# Patient Record
Sex: Female | Born: 1935 | Race: White | Hispanic: No | Marital: Married | Smoking: Never smoker
Health system: Southern US, Community
[De-identification: ages and names within clinical notes are randomized; demographics above are authoritative.]

## PROBLEM LIST (undated history)

## (undated) DIAGNOSIS — M8580 Other specified disorders of bone density and structure, unspecified site: Secondary | ICD-10-CM

## (undated) DIAGNOSIS — E785 Hyperlipidemia, unspecified: Secondary | ICD-10-CM

## (undated) DIAGNOSIS — N200 Calculus of kidney: Secondary | ICD-10-CM

## (undated) DIAGNOSIS — F419 Anxiety disorder, unspecified: Secondary | ICD-10-CM

## (undated) DIAGNOSIS — K219 Gastro-esophageal reflux disease without esophagitis: Secondary | ICD-10-CM

## (undated) DIAGNOSIS — A048 Other specified bacterial intestinal infections: Secondary | ICD-10-CM

## (undated) DIAGNOSIS — M1990 Unspecified osteoarthritis, unspecified site: Secondary | ICD-10-CM

## (undated) DIAGNOSIS — C801 Malignant (primary) neoplasm, unspecified: Secondary | ICD-10-CM

## (undated) HISTORY — PX: SKIN CANCER EXCISION: SHX779

## (undated) HISTORY — DX: Calculus of kidney: N20.0

## (undated) HISTORY — DX: Other specified bacterial intestinal infections: A04.8

## (undated) HISTORY — DX: Unspecified osteoarthritis, unspecified site: M19.90

## (undated) HISTORY — DX: Other specified disorders of bone density and structure, unspecified site: M85.80

## (undated) HISTORY — DX: Hyperlipidemia, unspecified: E78.5

## (undated) HISTORY — DX: Anxiety disorder, unspecified: F41.9

## (undated) HISTORY — DX: Malignant (primary) neoplasm, unspecified: C80.1

## (undated) HISTORY — DX: Gastro-esophageal reflux disease without esophagitis: K21.9

---

## 1998-02-11 ENCOUNTER — Other Ambulatory Visit: Admission: RE | Admit: 1998-02-11 | Discharge: 1998-02-11 | Payer: Self-pay | Admitting: Obstetrics and Gynecology

## 1999-05-13 ENCOUNTER — Other Ambulatory Visit: Admission: RE | Admit: 1999-05-13 | Discharge: 1999-05-13 | Payer: Self-pay | Admitting: Obstetrics and Gynecology

## 2000-06-24 ENCOUNTER — Other Ambulatory Visit: Admission: RE | Admit: 2000-06-24 | Discharge: 2000-06-24 | Payer: Self-pay | Admitting: Obstetrics and Gynecology

## 2001-06-28 HISTORY — PX: OTHER SURGICAL HISTORY: SHX169

## 2001-07-12 ENCOUNTER — Encounter: Admission: RE | Admit: 2001-07-12 | Discharge: 2001-07-12 | Payer: Self-pay | Admitting: General Surgery

## 2001-07-12 ENCOUNTER — Encounter: Payer: Self-pay | Admitting: General Surgery

## 2001-07-12 ENCOUNTER — Other Ambulatory Visit: Admission: RE | Admit: 2001-07-12 | Discharge: 2001-07-12 | Payer: Self-pay | Admitting: General Surgery

## 2001-07-24 ENCOUNTER — Encounter: Payer: Self-pay | Admitting: General Surgery

## 2001-07-27 ENCOUNTER — Ambulatory Visit (HOSPITAL_COMMUNITY): Admission: RE | Admit: 2001-07-27 | Discharge: 2001-07-27 | Payer: Self-pay | Admitting: General Surgery

## 2001-07-27 ENCOUNTER — Encounter (INDEPENDENT_AMBULATORY_CARE_PROVIDER_SITE_OTHER): Payer: Self-pay | Admitting: *Deleted

## 2001-08-08 ENCOUNTER — Ambulatory Visit: Admission: RE | Admit: 2001-08-08 | Discharge: 2001-11-06 | Payer: Self-pay | Admitting: *Deleted

## 2002-06-28 HISTORY — PX: COLONOSCOPY: SHX174

## 2002-07-05 ENCOUNTER — Other Ambulatory Visit: Admission: RE | Admit: 2002-07-05 | Discharge: 2002-07-05 | Payer: Self-pay | Admitting: Obstetrics and Gynecology

## 2003-04-03 ENCOUNTER — Encounter: Admission: RE | Admit: 2003-04-03 | Discharge: 2003-04-03 | Payer: Self-pay | Admitting: Internal Medicine

## 2003-04-03 ENCOUNTER — Encounter: Payer: Self-pay | Admitting: Internal Medicine

## 2003-06-12 ENCOUNTER — Encounter: Admission: RE | Admit: 2003-06-12 | Discharge: 2003-06-12 | Payer: Self-pay | Admitting: Internal Medicine

## 2004-05-08 ENCOUNTER — Ambulatory Visit: Payer: Self-pay | Admitting: Internal Medicine

## 2004-11-11 ENCOUNTER — Ambulatory Visit: Payer: Self-pay | Admitting: Hematology & Oncology

## 2005-01-15 ENCOUNTER — Ambulatory Visit: Payer: Self-pay | Admitting: Internal Medicine

## 2005-01-22 ENCOUNTER — Ambulatory Visit: Payer: Self-pay | Admitting: Internal Medicine

## 2005-03-24 ENCOUNTER — Inpatient Hospital Stay (HOSPITAL_COMMUNITY): Admission: EM | Admit: 2005-03-24 | Discharge: 2005-03-25 | Payer: Self-pay | Admitting: Internal Medicine

## 2005-03-24 ENCOUNTER — Encounter: Payer: Self-pay | Admitting: Emergency Medicine

## 2005-03-24 ENCOUNTER — Ambulatory Visit: Payer: Self-pay | Admitting: Internal Medicine

## 2005-03-25 ENCOUNTER — Ambulatory Visit: Payer: Self-pay | Admitting: Cardiology

## 2005-03-25 ENCOUNTER — Encounter: Payer: Self-pay | Admitting: Cardiology

## 2005-04-02 ENCOUNTER — Ambulatory Visit: Payer: Self-pay | Admitting: Internal Medicine

## 2005-04-08 ENCOUNTER — Ambulatory Visit: Payer: Self-pay | Admitting: Internal Medicine

## 2005-04-16 ENCOUNTER — Ambulatory Visit: Payer: Self-pay | Admitting: Internal Medicine

## 2005-04-27 ENCOUNTER — Ambulatory Visit: Payer: Self-pay | Admitting: Internal Medicine

## 2005-05-04 ENCOUNTER — Ambulatory Visit: Payer: Self-pay | Admitting: Internal Medicine

## 2005-05-12 ENCOUNTER — Ambulatory Visit: Payer: Self-pay | Admitting: Hematology & Oncology

## 2005-11-02 ENCOUNTER — Ambulatory Visit: Payer: Self-pay | Admitting: Internal Medicine

## 2005-11-09 ENCOUNTER — Ambulatory Visit: Payer: Self-pay | Admitting: Hematology & Oncology

## 2005-11-11 LAB — CBC WITH DIFFERENTIAL/PLATELET
BASO%: 0.6 % (ref 0.0–2.0)
Basophils Absolute: 0 10e3/uL (ref 0.0–0.1)
EOS%: 0.2 % (ref 0.0–7.0)
Eosinophils Absolute: 0 10e3/uL (ref 0.0–0.5)
HCT: 40.1 % (ref 34.8–46.6)
HGB: 13.7 g/dL (ref 11.6–15.9)
LYMPH%: 29.1 % (ref 14.0–48.0)
MCH: 31.3 pg (ref 26.0–34.0)
MCHC: 34 g/dL (ref 32.0–36.0)
MCV: 91.8 fL (ref 81.0–101.0)
MONO#: 0.5 10e3/uL (ref 0.1–0.9)
MONO%: 11.6 % (ref 0.0–13.0)
NEUT#: 2.7 10e3/uL (ref 1.5–6.5)
NEUT%: 58.5 % (ref 39.6–76.8)
Platelets: 227 10e3/uL (ref 145–400)
RBC: 4.37 10e6/uL (ref 3.70–5.32)
RDW: 12.3 % (ref 11.3–14.5)
WBC: 4.6 10e3/uL (ref 3.9–10.0)
lymph#: 1.3 10e3/uL (ref 0.9–3.3)

## 2005-11-11 LAB — COMPREHENSIVE METABOLIC PANEL WITH GFR
ALT: 19 U/L (ref 0–40)
AST: 18 U/L (ref 0–37)
Albumin: 4.5 g/dL (ref 3.5–5.2)
Alkaline Phosphatase: 61 U/L (ref 39–117)
BUN: 14 mg/dL (ref 6–23)
CO2: 28 meq/L (ref 19–32)
Calcium: 9.5 mg/dL (ref 8.4–10.5)
Chloride: 104 meq/L (ref 96–112)
Creatinine, Ser: 0.8 mg/dL (ref 0.4–1.2)
Glucose, Bld: 65 mg/dL — ABNORMAL LOW (ref 70–99)
Potassium: 4.2 meq/L (ref 3.5–5.3)
Sodium: 142 meq/L (ref 135–145)
Total Bilirubin: 0.4 mg/dL (ref 0.3–1.2)
Total Protein: 7.1 g/dL (ref 6.0–8.3)

## 2005-11-25 ENCOUNTER — Ambulatory Visit: Payer: Self-pay | Admitting: Internal Medicine

## 2005-12-06 ENCOUNTER — Ambulatory Visit: Payer: Self-pay | Admitting: Internal Medicine

## 2006-04-18 ENCOUNTER — Ambulatory Visit: Payer: Self-pay | Admitting: Internal Medicine

## 2006-05-10 ENCOUNTER — Ambulatory Visit: Payer: Self-pay | Admitting: Hematology & Oncology

## 2006-05-12 LAB — CBC WITH DIFFERENTIAL/PLATELET
BASO%: 0.7 % (ref 0.0–2.0)
Basophils Absolute: 0 10*3/uL (ref 0.0–0.1)
HCT: 40 % (ref 34.8–46.6)
HGB: 13.7 g/dL (ref 11.6–15.9)
MCHC: 34.2 g/dL (ref 32.0–36.0)
MONO#: 0.6 10*3/uL (ref 0.1–0.9)
NEUT#: 4.7 10*3/uL (ref 1.5–6.5)
NEUT%: 69.5 % (ref 39.6–76.8)
WBC: 6.8 10*3/uL (ref 3.9–10.0)
lymph#: 1.4 10*3/uL (ref 0.9–3.3)

## 2006-05-12 LAB — COMPREHENSIVE METABOLIC PANEL
ALT: 32 U/L (ref 0–35)
CO2: 29 mEq/L (ref 19–32)
Calcium: 9.9 mg/dL (ref 8.4–10.5)
Chloride: 105 mEq/L (ref 96–112)
Creatinine, Ser: 0.74 mg/dL (ref 0.40–1.20)

## 2006-11-15 ENCOUNTER — Ambulatory Visit: Payer: Self-pay | Admitting: Hematology & Oncology

## 2006-11-17 LAB — CBC WITH DIFFERENTIAL/PLATELET
Eosinophils Absolute: 0 10*3/uL (ref 0.0–0.5)
MONO#: 0.5 10*3/uL (ref 0.1–0.9)
NEUT#: 3.9 10*3/uL (ref 1.5–6.5)
RBC: 4.51 10*6/uL (ref 3.70–5.32)
RDW: 12.1 % (ref 11.3–14.5)
WBC: 6 10*3/uL (ref 3.9–10.0)

## 2006-11-17 LAB — COMPREHENSIVE METABOLIC PANEL
Albumin: 4.9 g/dL (ref 3.5–5.2)
CO2: 28 mEq/L (ref 19–32)
Chloride: 104 mEq/L (ref 96–112)
Glucose, Bld: 84 mg/dL (ref 70–99)
Potassium: 4.2 mEq/L (ref 3.5–5.3)
Sodium: 144 mEq/L (ref 135–145)
Total Protein: 7.1 g/dL (ref 6.0–8.3)

## 2006-12-12 ENCOUNTER — Encounter: Payer: Self-pay | Admitting: Internal Medicine

## 2007-02-09 ENCOUNTER — Ambulatory Visit: Payer: Self-pay | Admitting: Internal Medicine

## 2007-02-11 LAB — CONVERTED CEMR LAB
ALT: 18 units/L (ref 0–35)
Total CHOL/HDL Ratio: 3.2
Triglycerides: 96 mg/dL (ref 0–149)
VLDL: 19 mg/dL (ref 0–40)

## 2007-02-13 ENCOUNTER — Encounter (INDEPENDENT_AMBULATORY_CARE_PROVIDER_SITE_OTHER): Payer: Self-pay | Admitting: *Deleted

## 2007-02-17 ENCOUNTER — Ambulatory Visit: Payer: Self-pay | Admitting: Internal Medicine

## 2007-02-17 LAB — CONVERTED CEMR LAB: LDL Goal: 160 mg/dL

## 2007-03-07 ENCOUNTER — Encounter: Payer: Self-pay | Admitting: Internal Medicine

## 2007-03-07 ENCOUNTER — Ambulatory Visit: Payer: Self-pay | Admitting: Family Medicine

## 2007-06-01 ENCOUNTER — Ambulatory Visit: Payer: Self-pay | Admitting: Internal Medicine

## 2007-08-22 ENCOUNTER — Telehealth (INDEPENDENT_AMBULATORY_CARE_PROVIDER_SITE_OTHER): Payer: Self-pay | Admitting: *Deleted

## 2007-08-25 ENCOUNTER — Ambulatory Visit: Payer: Self-pay | Admitting: Internal Medicine

## 2007-08-25 ENCOUNTER — Encounter (INDEPENDENT_AMBULATORY_CARE_PROVIDER_SITE_OTHER): Payer: Self-pay | Admitting: *Deleted

## 2007-11-13 ENCOUNTER — Encounter: Payer: Self-pay | Admitting: Internal Medicine

## 2007-11-29 ENCOUNTER — Ambulatory Visit: Payer: Self-pay | Admitting: Internal Medicine

## 2007-12-06 ENCOUNTER — Ambulatory Visit: Payer: Self-pay | Admitting: Hematology & Oncology

## 2007-12-11 ENCOUNTER — Encounter: Payer: Self-pay | Admitting: Internal Medicine

## 2007-12-11 LAB — CBC WITH DIFFERENTIAL/PLATELET
Basophils Absolute: 0 10*3/uL (ref 0.0–0.1)
HCT: 38.6 % (ref 34.8–46.6)
HGB: 13.3 g/dL (ref 11.6–15.9)
MCH: 31.4 pg (ref 26.0–34.0)
MONO#: 0.5 10*3/uL (ref 0.1–0.9)
NEUT%: 58.5 % (ref 39.6–76.8)
WBC: 4.6 10*3/uL (ref 3.9–10.0)
lymph#: 1.4 10*3/uL (ref 0.9–3.3)

## 2007-12-11 LAB — COMPREHENSIVE METABOLIC PANEL
BUN: 18 mg/dL (ref 6–23)
CO2: 28 mEq/L (ref 19–32)
Calcium: 9.7 mg/dL (ref 8.4–10.5)
Chloride: 104 mEq/L (ref 96–112)
Creatinine, Ser: 0.85 mg/dL (ref 0.40–1.20)
Glucose, Bld: 88 mg/dL (ref 70–99)

## 2008-03-07 ENCOUNTER — Telehealth (INDEPENDENT_AMBULATORY_CARE_PROVIDER_SITE_OTHER): Payer: Self-pay | Admitting: *Deleted

## 2008-03-12 ENCOUNTER — Ambulatory Visit: Payer: Self-pay | Admitting: Internal Medicine

## 2008-03-13 ENCOUNTER — Encounter (INDEPENDENT_AMBULATORY_CARE_PROVIDER_SITE_OTHER): Payer: Self-pay | Admitting: *Deleted

## 2008-03-13 LAB — CONVERTED CEMR LAB
AST: 23 units/L (ref 0–37)
HDL: 56.3 mg/dL (ref >39.0)
Total CHOL/HDL Ratio: 2.9
VLDL: 12 mg/dL (ref 0–40)

## 2008-04-04 ENCOUNTER — Ambulatory Visit: Payer: Self-pay | Admitting: Internal Medicine

## 2008-04-04 DIAGNOSIS — E785 Hyperlipidemia, unspecified: Secondary | ICD-10-CM | POA: Insufficient documentation

## 2008-04-04 DIAGNOSIS — Z85828 Personal history of other malignant neoplasm of skin: Secondary | ICD-10-CM | POA: Insufficient documentation

## 2008-04-04 DIAGNOSIS — Z87442 Personal history of urinary calculi: Secondary | ICD-10-CM | POA: Insufficient documentation

## 2008-04-04 DIAGNOSIS — Z853 Personal history of malignant neoplasm of breast: Secondary | ICD-10-CM | POA: Insufficient documentation

## 2008-04-04 DIAGNOSIS — R351 Nocturia: Secondary | ICD-10-CM | POA: Insufficient documentation

## 2008-06-16 ENCOUNTER — Emergency Department (HOSPITAL_COMMUNITY): Admission: EM | Admit: 2008-06-16 | Discharge: 2008-06-16 | Payer: Self-pay | Admitting: Emergency Medicine

## 2008-07-04 ENCOUNTER — Ambulatory Visit: Payer: Self-pay | Admitting: Internal Medicine

## 2008-07-04 DIAGNOSIS — F411 Generalized anxiety disorder: Secondary | ICD-10-CM | POA: Insufficient documentation

## 2008-07-05 ENCOUNTER — Encounter: Payer: Self-pay | Admitting: Internal Medicine

## 2008-11-26 ENCOUNTER — Ambulatory Visit: Payer: Self-pay | Admitting: Hematology & Oncology

## 2008-11-27 ENCOUNTER — Encounter: Payer: Self-pay | Admitting: Internal Medicine

## 2008-11-27 LAB — COMPREHENSIVE METABOLIC PANEL
ALT: 20 U/L (ref 0–35)
AST: 21 U/L (ref 0–37)
Albumin: 4.4 g/dL (ref 3.5–5.2)
BUN: 18 mg/dL (ref 6–23)
CO2: 24 mEq/L (ref 19–32)
Calcium: 9.3 mg/dL (ref 8.4–10.5)
Chloride: 104 mEq/L (ref 96–112)
Creatinine, Ser: 0.68 mg/dL (ref 0.40–1.20)
Potassium: 4.1 mEq/L (ref 3.5–5.3)

## 2008-11-27 LAB — CBC WITH DIFFERENTIAL (CANCER CENTER ONLY)
Eosinophils Absolute: 0 10*3/uL (ref 0.0–0.5)
MCH: 31.4 pg (ref 26.0–34.0)
MONO%: 8.5 % (ref 0.0–13.0)
NEUT#: 2.7 10*3/uL (ref 1.5–6.5)
Platelets: 190 10*3/uL (ref 145–400)
RBC: 4.21 10*6/uL (ref 3.70–5.32)
WBC: 4.5 10*3/uL (ref 3.9–10.0)

## 2008-11-28 LAB — VITAMIN D 25 HYDROXY (VIT D DEFICIENCY, FRACTURES): Vit D, 25-Hydroxy: 39 ng/mL (ref 30–89)

## 2009-01-13 ENCOUNTER — Encounter: Payer: Self-pay | Admitting: Internal Medicine

## 2009-04-02 ENCOUNTER — Ambulatory Visit: Payer: Self-pay | Admitting: Internal Medicine

## 2009-04-02 ENCOUNTER — Encounter: Payer: Self-pay | Admitting: Family Medicine

## 2009-04-08 ENCOUNTER — Telehealth (INDEPENDENT_AMBULATORY_CARE_PROVIDER_SITE_OTHER): Payer: Self-pay | Admitting: *Deleted

## 2009-04-09 ENCOUNTER — Telehealth (INDEPENDENT_AMBULATORY_CARE_PROVIDER_SITE_OTHER): Payer: Self-pay | Admitting: *Deleted

## 2009-04-10 ENCOUNTER — Ambulatory Visit: Payer: Self-pay | Admitting: Internal Medicine

## 2009-04-12 LAB — CONVERTED CEMR LAB
HDL: 63.5 mg/dL (ref >39.00)
LDL Cholesterol: 119 mg/dL — ABNORMAL HIGH (ref 0–99)
Total CHOL/HDL Ratio: 3
Triglycerides: 74 mg/dL (ref 0.0–149.0)
VLDL: 14.8 mg/dL (ref 0.0–40.0)

## 2009-04-14 ENCOUNTER — Encounter (INDEPENDENT_AMBULATORY_CARE_PROVIDER_SITE_OTHER): Payer: Self-pay | Admitting: *Deleted

## 2009-07-17 ENCOUNTER — Telehealth (INDEPENDENT_AMBULATORY_CARE_PROVIDER_SITE_OTHER): Payer: Self-pay | Admitting: *Deleted

## 2009-07-31 ENCOUNTER — Ambulatory Visit: Payer: Self-pay | Admitting: Internal Medicine

## 2009-07-31 DIAGNOSIS — M1990 Unspecified osteoarthritis, unspecified site: Secondary | ICD-10-CM | POA: Insufficient documentation

## 2009-07-31 DIAGNOSIS — M8580 Other specified disorders of bone density and structure, unspecified site: Secondary | ICD-10-CM | POA: Insufficient documentation

## 2009-08-07 ENCOUNTER — Ambulatory Visit: Payer: Self-pay | Admitting: Internal Medicine

## 2009-08-07 ENCOUNTER — Encounter: Payer: Self-pay | Admitting: Internal Medicine

## 2009-11-27 ENCOUNTER — Ambulatory Visit: Payer: Self-pay | Admitting: Hematology & Oncology

## 2009-11-27 ENCOUNTER — Encounter: Payer: Self-pay | Admitting: Internal Medicine

## 2009-11-27 LAB — CBC WITH DIFFERENTIAL (CANCER CENTER ONLY)
BASO%: 0.7 % (ref 0.0–2.0)
EOS%: 0.6 % (ref 0.0–7.0)
LYMPH%: 34.7 % (ref 14.0–48.0)
MCHC: 34.3 g/dL (ref 32.0–36.0)
MCV: 92 fL (ref 81–101)
MONO#: 0.4 10*3/uL (ref 0.1–0.9)
MONO%: 9.7 % (ref 0.0–13.0)
Platelets: 175 10*3/uL (ref 145–400)
RDW: 11.2 % (ref 10.5–14.6)
WBC: 3.8 10*3/uL — ABNORMAL LOW (ref 3.9–10.0)

## 2009-11-27 LAB — COMPREHENSIVE METABOLIC PANEL
ALT: 19 U/L (ref 0–35)
AST: 18 U/L (ref 0–37)
Alkaline Phosphatase: 47 U/L (ref 39–117)
CO2: 25 mEq/L (ref 19–32)
Sodium: 141 mEq/L (ref 135–145)
Total Bilirubin: 0.4 mg/dL (ref 0.3–1.2)
Total Protein: 6.5 g/dL (ref 6.0–8.3)

## 2010-01-19 ENCOUNTER — Telehealth: Payer: Self-pay | Admitting: Internal Medicine

## 2010-01-22 ENCOUNTER — Ambulatory Visit: Payer: Self-pay | Admitting: Internal Medicine

## 2010-01-29 LAB — CONVERTED CEMR LAB
Albumin: 4.4 g/dL (ref 3.5–5.2)
Cholesterol: 217 mg/dL — ABNORMAL HIGH (ref 0–200)
Direct LDL: 147.1 mg/dL
Total CHOL/HDL Ratio: 3
Triglycerides: 61 mg/dL (ref 0.0–149.0)
VLDL: 12.2 mg/dL (ref 0.0–40.0)

## 2010-02-17 ENCOUNTER — Encounter: Payer: Self-pay | Admitting: Internal Medicine

## 2010-04-13 ENCOUNTER — Telehealth: Payer: Self-pay | Admitting: Internal Medicine

## 2010-04-21 ENCOUNTER — Ambulatory Visit: Payer: Self-pay | Admitting: Internal Medicine

## 2010-04-27 LAB — CONVERTED CEMR LAB
ALT: 21 units/L (ref 0–35)
AST: 22 units/L (ref 0–37)
Bilirubin, Direct: 0.1 mg/dL (ref 0.0–0.3)
Direct LDL: 116.1 mg/dL
HDL: 69.1 mg/dL (ref >39.00)
Total Protein: 6.8 g/dL (ref 6.0–8.3)
Triglycerides: 76 mg/dL (ref 0.0–149.0)

## 2010-04-29 ENCOUNTER — Telehealth (INDEPENDENT_AMBULATORY_CARE_PROVIDER_SITE_OTHER): Payer: Self-pay | Admitting: *Deleted

## 2010-07-28 NOTE — Progress Notes (Signed)
Summary: Labs needed  Phone Note Outgoing Call   Call placed by: Lucious Groves CMA,  April 13, 2010 8:48 AM Call placed to: Patient Summary of Call: Left message with patient spouse that the patient needs lab appt. ( lipid, hepatic panel dx: 272.4/995.20). Initial call taken by: Lucious Groves CMA,  April 13, 2010 8:48 AM

## 2010-07-28 NOTE — Progress Notes (Signed)
Summary: RX(Hold until Labs Back)  Phone Note Call from Patient   Summary of Call: PT CAME IN AN  MADE AND APPT FOR LABS.  SHE WILL HAVE THEM DONE ON THURS AND LIKE TO KNOW IF SHE CAN GET A 1 YEAR SUPPLY SENT TO [facility] FOR SIMVASTATIN 40. IF NOT TO BE TO SENT TO [facility]. ONLY IF SHE IS GOING TO CONTINUE ON THIS MED. Initial call taken by: Freddy Jaksch,  January 19, 2010 3:09 PM  Follow-up for Phone Call        This will be futher addressed when lab work comes back./[facility] CMA  January 19, 2010 3:38 PM   Additional Follow-up for Phone Call Additional follow up Details #1::        Dr.Bernarda Erck please address labs to be discussed with patient Additional Follow-up by: Shonna Chock CMA,  January 23, 2010 2:10 PM    Additional Follow-up for Phone Call Additional follow up Details #2::    fasting lipids, hepatic panel ( 272.4, 995.20) were done Follow-up by: Marga Melnick MD,  January 23, 2010 4:50 PM

## 2010-07-28 NOTE — Letter (Signed)
[facility]   Imported By: Lanelle Bal 01/05/2010 09:07:40  _____________________________________________________________________  External Attachment:    Type:   Image     Comment:   External Document

## 2010-07-28 NOTE — Progress Notes (Signed)
Summary: NEEDS VYTORIN REFILL--DOES SHE NEED APPOINTMENT??  Phone Note Call from Patient Call back at CELL - [phone]   Caller: Patient Summary of Call: PATIENT DROPPED OFF PAPERWORK  REQUESTING 90 DAY PRESCRIPTION PLUS REFILLS FOR VYTORIN---NEEDS TO GO TO [facility]  SAYS DR HOPPER ONLY GAVE HER A 90 DAY SUPPLY THIS TIME, BUT NOTHING WAS SAID ABOUT HER NEEDING AN APPOINTMENT FOR A 6 MONTH FOLLOWUP OR A YEARLY EXAM SO I GAVE HER AN APPOINTMENT FOR 07/31/2009 AT 3:30 PM  PLEASE CALL HER TO CONFIRM THIS APPONTMENT AS BEING NECESSARY OR CALL HER TO CANCEL IF DR HOPPER WILL JUST GIVE HER A 90 DAY  PRESCRIPTION WITH 2-3 REFILLS  WILL TAKE HER PAPERWORK BACK TO [facility]   Initial call taken by: Jerolyn Shin,  July 17, 2009 4:45 PM  Follow-up for Phone Call        Spoke with patient, patient aware rx sent and to keep appointment for 02/11 for her yearly follow-up Follow-up by: Shonna Chock,  July 18, 2009 10:13 AM    Prescriptions: VYTORIN 10-20 MG  TABS (EZETIMIBE-SIMVASTATIN) 1 by mouth qd  #90 x 2   Entered by:   Shonna Chock   Authorized by:   Marga Melnick MD   Signed by:   Shonna Chock on 07/18/2009   Method used:   Faxed to ...       [facility]       [address] [place], [place]  [address]       Ph: [phone]       Fax: [phone]   RxID:   [id number]

## 2010-07-28 NOTE — Miscellaneous (Signed)
Summary: BONE DENSITY  Clinical Lists Changes  Orders: Added new Test order of T-Bone Densitometry (77080) - Signed Added new Test order of T-Lumbar Vertebral Assessment (77082) - Signed 

## 2010-07-28 NOTE — Assessment & Plan Note (Signed)
Summary: YEARLY EXAM---HAD LABS ALREADY??///SPH   Vital Signs:  Patient profile:   77 year old female Height:      64 inches Weight:      120.8 pounds BMI:     20.81 O2 Sat:      99 % on Room air Pulse rate:   95 / minute Resp:     15 per minute BP sitting:   110 / 70  Vitals Entered By: Doristine Devoid (July 31, 2009 3:26 PM)  O2 Flow:  Room air CC: yearly, Lipid Management Is Patient Diabetic? No   CC:  yearly and Lipid Management.  History of Present Illness: Josceline is here for Vytorin 10/20 ; it costs her $125 for 3 months. Lipids done in 03/2009 reviewed.FH of premature CAD; her mother had MI @ 8. On low fat, low salt diet. CVE decreased except stairs @ work w/o symptoms. Mammograms & flu shot up to date.  Lipid Management History:      Positive NCEP/ATP III risk factors include female age 59 years old or older and family history for ischemic heart disease (females less than 80 years old).  Negative NCEP/ATP III risk factors include no history of early menopause without estrogen hormone replacement, non-diabetic, HDL cholesterol greater than 60, non-tobacco-user status, non-hypertensive, no ASHD (atherosclerotic heart disease), no prior stroke/TIA, no peripheral vascular disease, and no history of aortic aneurysm.     Current Medications (verified): 1)  Vytorin 10-20 Mg  Tabs (Ezetimibe-Simvastatin) .Marland Kitchen.. 1 By Mouth Qd 2)  Multivitamin 3)  Baby Asa 4)  Folic Acid 5)  Calucet(Calcium) .Marland Kitchen.. 1 By Mouth Once Daily 6)  Fish Oil 7)  Coq10 8)  Eye Vitamins  Tabs (Multiple Vitamins-Minerals) .Marland Kitchen.. 1 By Mouth Once Daily 9)  Voltaren 1 % Gel (Diclofenac Sodium) .... Aply Once Daily As Needed 10)  Lorazepam 0.5 Mg Tabs (Lorazepam) .Marland Kitchen.. 1 Q 8 Hrs As Needed  For Stress 11)  Vitamin D  Allergies (verified): 1)  ! Percocet  Past History:  Past Medical History: Hydronephrosis Breast cancer, hx of, invasive ductal, Dr Myna Hidalgo Hyperlipidemia Head trauma post fall,IP X 24 hrs   2006 Skin cancer, hx of Nephrolithiasis, hx of, Dr Andee Poles Osteoarthritis, Dr Kellie Simmering Osteopenia : -1.1 @ femoral neck 02/2007  Past Surgical History: Mastectomy; partial  2003 Colonoscopy  1998 for rectal bleeding;repeat  negative  2004, (due 2009 , not completed), Dr Russella Dar   Family History: Father: lung CA, COAD Mother:died  @ 6 [place] Siblings: sister CVA ,Alsheimer's; bro MI @ 92  Social History: Occupation: Armed forces logistics/support/administrative officer Never Smoked Alcohol use-no Married  Review of Systems ENT:  Denies difficulty swallowing and hoarseness. CV:  Denies chest pain or discomfort, leg cramps with exertion, palpitations, shortness of breath with exertion, swelling of feet, and swelling of hands. Resp:  Denies cough and sputum productive. GI:  Denies abdominal pain, bloody stools, dark tarry stools, and indigestion; Colonoscopy due. GU:  Denies discharge, dysuria, and hematuria; Dr Tenny Craw seen annually. Nocturia 0- 2X/ night. MS:  Denies joint pain, joint redness, joint swelling, low back pain, mid back pain, and thoracic pain. Derm:  Complains of changes in nail beds; denies dryness, hair loss, and lesion(s); Fungal nail change. Neuro:  Denies numbness and tingling. Psych:  Denies anxiety, depression, easily angered, easily tearful, and irritability. Endo:  Denies cold intolerance, excessive hunger, excessive thirst, excessive urination, and heat intolerance. Heme:  Denies abnormal bruising and bleeding.  Physical Exam  General:  Appears younger than age,well-nourished,in  no acute distress; alert,appropriate and cooperative throughout examination Neck:  No deformities, masses, or tenderness noted. Lungs:  Normal respiratory effort, chest expands symmetrically. Lungs are clear to auscultation, no crackles or wheezes. Heart:  Normal rate and regular rhythm. S1 and S2 normal without gallop, murmur, click, rub. S4 with slurring Abdomen:  Bowel sounds positive,abdomen soft and non-tender  without masses, organomegaly or hernias noted. Genitalia:  Dr Tenny Craw Msk:  No deformity or scoliosis noted of thoracic or lumbar spine.   Pulses:  R and L carotid,radial,dorsalis pedis and posterior tibial pulses are full and equal bilaterally Extremities:  No clubbing, cyanosis, edema. PIP fusiform changes R hand   Neurologic:  alert & oriented X3 and DTRs symmetrical and normal.   Skin:  Intact without suspicious lesions or rashes Cervical Nodes:  No lymphadenopathy noted Axillary Nodes:  No palpable lymphadenopathy Psych:  memory intact for recent and remote, normally interactive, and good eye contact.     Impression & Recommendations:  Problem # 1:  HYPERLIPIDEMIA (ICD-272.4)  The following medications were removed from the medication list:    Vytorin 10-20 Mg Tabs (Ezetimibe-simvastatin) .Marland Kitchen... 1 by mouth qd Her updated medication list for this problem includes:    Simvastatin 40 Mg Tabs (Simvastatin) .Marland Kitchen... 1 at bedtime after vytorin completed  Problem # 2:  OSTEOARTHRITIS (ICD-715.90) meds declined  Problem # 3:  OSTEOPENIA (ICD-733.90)  Orders: Radiology Referral (Radiology)  Problem # 4:  BREAST CANCER, HX OF (ICD-V10.3) as per Dr Myna Hidalgo  Complete Medication List: 1)  Multivitamin  2)  Baby Asa  3)  Folic Acid  4)  Calucet(calcium)  .Marland Kitchen.. 1 by mouth once daily 5)  Fish Oil  6)  Coq10  7)  Eye Vitamins Tabs (Multiple vitamins-minerals) .Marland Kitchen.. 1 by mouth once daily 8)  Voltaren 1 % Gel (Diclofenac sodium) .... Aply once daily as needed 9)  Lorazepam 0.5 Mg Tabs (Lorazepam) .Marland Kitchen.. 1 q 8 hrs as needed  for stress 10)  Vitamin D  11)  Simvastatin 40 Mg Tabs (Simvastatin) .Marland Kitchen.. 1 at bedtime after vytorin completed  Other Orders: EKG w/ Interpretation (93000)  Lipid Assessment/Plan:      Based on NCEP/ATP III, the patient's risk factor category is "0-1 risk factors".  The patient's lipid goals are as follows: Total cholesterol goal is 200; LDL cholesterol goal is 130; HDL  cholesterol goal is 40; Triglyceride goal is 150.  Her LDL cholesterol goal has been met.    Patient Instructions: 1)  Complete Vytorin , then start Simvastatin 40 mg at bedtime . After 10 weeks of change please check fasting labs: 2)  Please schedule a follow-up appointment in 6 months. 3)  Hepatic Panel prior to visit, ICD-9:995.20; 4)  Lipid Panel prior to visit, ICD-9:272.4 Prescriptions: SIMVASTATIN 40 MG TABS (SIMVASTATIN) 1 at bedtime AFTER Vytorin completed  #90 x 0   Entered and Authorized by:   Marga Melnick MD   Signed by:   Marga Melnick MD on 07/31/2009   Method used:   Print then Give to Patient   RxID:   [id number]

## 2010-07-28 NOTE — Progress Notes (Signed)
Summary: refill  Phone Note Refill Request Call back at [phone]   Dr Frederik Pear note on lab result [phone]) stated labs  are good on simvastatin 40mg  --patient is not taking simvastatin she is taking crestor - patient needs refill for crestor   [facility] - 90 DAY supply x"s 3 refills  Initial call taken by: Okey Regal [place],  April 29, 2010 9:54 AM    New/Updated Medications: CRESTOR 20 MG TABS (ROSUVASTATIN CALCIUM) 1 by mouth once daily Prescriptions: CRESTOR 20 MG TABS (ROSUVASTATIN CALCIUM) 1 by mouth once daily  #90 x 2   Entered by:   Shonna Chock CMA   Authorized by:   Marga Melnick MD   Signed by:   Shonna Chock CMA on 04/29/2010   Method used:   Faxed to ...       [facility]       [address] [place], [place]  [address]       Ph: [phone]       Fax: [phone]   RxID:   [id number]

## 2010-11-13 NOTE — Op Note (Signed)
Whitney Donaldson. [facility]  Patient:    Whitney Donaldson, Whitney Donaldson Visit Number: [id number] MRN: [id number]          Service Type: DSU Location: RCRM 2550 07 Attending Physician:  Carson Myrtle Dictated by:   Sheppard Plumber Earlene Plater, M.D. Proc. Date: 07/27/01 Admit Date:  07/27/2001 Discharge Date: 07/27/2001                             Operative Report  PREOPERATIVE DIAGNOSIS:  Carcinoma of the left breast.  POSTOPERATIVE DIAGNOSIS:  Carcinoma of the left breast.  OPERATION PERFORMED:  Left partial mastectomy and sentinel node biopsy with Neoprobe and Lymphazurin dye.  SURGEON:  Timothy E. Earlene Plater, M.D.  ANESTHESIA:  General.  INDICATIONS FOR PROCEDURE:  Whitney Donaldson is 77 years old, and was discovered to have an abnormal mammogram.  A core biopsy showed invasive carcinoma.  She has been carefully counseled and wishes to proceed at this time with a partial mastectomy and a sentinel node biopsy.  I believe she understands and agrees with the procedure.  DESCRIPTION OF PROCEDURE:  She was evaluated by anesthesia and taken to the operating room and placed supine.  LMA anesthesia provided.  The left breast was inspected.  The mass was a minimally palpable mass in the 1 oclock position 5 cm outside the areolar edge.  The nipple had been injected with technetium.  Lymphazurin dye was injected around and about the nipple areolar complex and this was massaged for 5 minutes.  Then the breast was prepped and draped in the usual fashion.  The axilla was approached first using the Neoprobe.  There was one location in the upper axilla in the midline of the axilla that was counting approximately 1500 counts.  The nipple areolar complex counted 5000 counts.  Her background counts were approximately 10. The inferior mammary and supraclavicular areas were background only.  I made an incision in the midaxilla and then using the Neoprobe for direction as well, the Lymphazurin  dye, I was able to locate a hot blue node in the midaxilla just posterior to the midline.  I isolated that and removed it.  It was indeed hot and blue, counts of approximately 1500. This was submitted as lymph node #1 hot and blue.  Follow-up use of the Neoprobe revealed a second node that was more medial and inferior to the first node.  This, too, was hot and blue.  It was removed.  Clips were applied to both areas where the nodes had been removed.  The second lymph node was labeled hot and blue lymph node #2.  Some fragments of fatty tissue were reserved and submitted as specimen #4 remnants of axillary dissection.  The axilla was dry.  It was closed in layers and covered.  Then the breast was approached by a curvilinear incision over the palpable mass.  Approximately 1.5 cm of normal fatty subcutaneous tissue was dissected around and about.  The fatty tissue was included with the breast specimen.  Using blunt dissection, a very generous partial mastectomy was accomplished including the upper outer quadrant from the nipple areolar complex to the axillary tail deep to the pectoralis fascia.  This entire area was removed en bloc and tagged and labeled and sent to pathology.  Bleeding was controlled with cautery.  That wound was also dry.  It was irrigated and closed in layers.  We received a phone call from Dr. Clelia Croft saying the  lymph nodes were negative.  I talked to her about the partial mastectomy specimen. We continued to clean up and close the wound, apply Steri-Strips and dry sterile dressing.  Dr. Clelia Croft did call back.  She thought that the so called anterior margin which had not been clearly marked, might be close to positive. Because there was amply fatty tissue between the edge of the breast tissue and the skin, I know that there was more than adequate margin of tissue there. She did not take into account the subcutaneous fat that was loosely attached to the specimen.  So I did not  reopen the wound, since all the tissue that could be removed was removed from the anterior surface of the palpable lump.  In any case, all counts were correct.  She had tolerated it well, was awakened and taken to recovery room in good condition.  Written and verbal instructions were given her and her husband and she will be seen and followed as an outpatient. Dictated by:   Sheppard Plumber Earlene Plater, M.D. Attending Physician:  Carson Myrtle DD:  07/27/01 TD:  07/27/01 Job: [id number]

## 2010-11-13 NOTE — Discharge Summary (Signed)
Whitney, Donaldson NO.:  [id number]   MEDICAL RECORD NO.:  [id number]          PATIENT TYPE:  INP   LOCATION:  5524                         FACILITY:  [facility]   PHYSICIAN:  Rene Paci, M.D. LHCDATE OF BIRTH:  12-May-1933   DATE OF ADMISSION:  03/24/2005  DATE OF DISCHARGE:  03/25/2005                                 DISCHARGE SUMMARY   DISCHARGE DIAGNOSES:  1.  Status post fall/syncope.  2.  Mild carotid artery stenosis on the left.   HISTORY OF PRESENT ILLNESS:  The patient is a 77 year old female with a  history of dyslipidemia and breast cancer who presented to the [facility] secondary to fall on the morning of admission. In addition, the patient  reports cramping in her left calf left evening and got out of bed to walk it  off and experienced a fall. The patient was admitted for further evaluation.   PAST MEDICAL HISTORY:  1.  Dyslipidemia.  2.  Breast cancer.  3.  Partial mastectomy.   COURSE OF HOSPITALIZATION:  Problem #1:  Fall, questionable syncope. The  patient was admitted and she underwent serial cardiac enzymes which were  negative. In addition, CT head was performed which showed no acute changes.  A carotid duplex was also performed which showed a left internal carotid  artery stenosis with 40% to 60% stenosis. The patient was maintained on  telemetry and no bradycardia was noted. An MRI was performed and a 2-D  echocardiogram was performed during this hospitalization as well.   MEDICATIONS AT DISCHARGE:  1.  Vytorin 10/20 mg daily.  2.  Arimidex 1 mg p.o. daily.  3.  Baby aspirin 81 mg p.o. daily.   FOLLOWUP:  The patient was instructed to follow up with Dr. Alwyn Ren in one to  two weeks to follow up test results.      Whitney S. Peggyann Juba, NP      Rene Paci, M.D. [facility]  Electronically Signed    MSO/MEDQ  D:  05/11/2005  T:  05/12/2005  Job:  [id number]   cc:   Titus Dubin. Alwyn Ren, M.D. [facility]  [phone] [place]  [place] [address]

## 2010-12-03 ENCOUNTER — Other Ambulatory Visit: Payer: Self-pay | Admitting: Hematology & Oncology

## 2010-12-03 ENCOUNTER — Encounter (HOSPITAL_BASED_OUTPATIENT_CLINIC_OR_DEPARTMENT_OTHER): Payer: Medicare Other | Admitting: Hematology & Oncology

## 2010-12-03 DIAGNOSIS — C50419 Malignant neoplasm of upper-outer quadrant of unspecified female breast: Secondary | ICD-10-CM

## 2010-12-03 DIAGNOSIS — E559 Vitamin D deficiency, unspecified: Secondary | ICD-10-CM

## 2010-12-03 LAB — CBC WITH DIFFERENTIAL (CANCER CENTER ONLY)
BASO#: 0 10*3/uL (ref 0.0–0.2)
BASO%: 0.4 % (ref 0.0–2.0)
EOS%: 0.2 % (ref 0.0–7.0)
HCT: 38.7 % (ref 34.8–46.6)
HGB: 13.1 g/dL (ref 11.6–15.9)
LYMPH%: 23.3 % (ref 14.0–48.0)
MCH: 31.3 pg (ref 26.0–34.0)
MCHC: 33.9 g/dL (ref 32.0–36.0)
MCV: 92 fL (ref 81–101)
NEUT%: 64 % (ref 39.6–80.0)
RDW: 11.6 % (ref 11.1–15.7)

## 2010-12-04 LAB — COMPREHENSIVE METABOLIC PANEL
ALT: 19 U/L (ref 0–35)
AST: 18 U/L (ref 0–37)
Albumin: 4.6 g/dL (ref 3.5–5.2)
Alkaline Phosphatase: 54 U/L (ref 39–117)
BUN: 18 mg/dL (ref 6–23)
CO2: 26 mEq/L (ref 19–32)
Calcium: 9.4 mg/dL (ref 8.4–10.5)
Chloride: 104 mEq/L (ref 96–112)
Creatinine, Ser: 0.67 mg/dL (ref 0.50–1.10)
Glucose, Bld: 86 mg/dL (ref 70–99)
Potassium: 4.3 mEq/L (ref 3.5–5.3)
Sodium: 142 mEq/L (ref 135–145)
Total Bilirubin: 0.4 mg/dL (ref 0.3–1.2)
Total Protein: 6.7 g/dL (ref 6.0–8.3)

## 2010-12-04 LAB — VITAMIN D 25 HYDROXY (VIT D DEFICIENCY, FRACTURES): Vit D, 25-Hydroxy: 49 ng/mL (ref 30–89)

## 2011-01-28 ENCOUNTER — Other Ambulatory Visit: Payer: Self-pay | Admitting: *Deleted

## 2011-01-28 MED ORDER — ROSUVASTATIN CALCIUM 20 MG PO TABS: 20.0000 mg | ORAL_TABLET | Freq: Every day | ORAL | Status: DC

## 2011-04-07 ENCOUNTER — Encounter: Payer: Self-pay | Admitting: Internal Medicine

## 2011-04-07 ENCOUNTER — Ambulatory Visit (INDEPENDENT_AMBULATORY_CARE_PROVIDER_SITE_OTHER): Payer: Medicare Other | Admitting: Internal Medicine

## 2011-04-07 VITALS — BP 124/78 | HR 90 | Temp 98.4°F | Resp 12 | Ht 63.0 in | Wt 118.6 lb

## 2011-04-07 DIAGNOSIS — M899 Disorder of bone, unspecified: Secondary | ICD-10-CM

## 2011-04-07 DIAGNOSIS — E041 Nontoxic single thyroid nodule: Secondary | ICD-10-CM

## 2011-04-07 DIAGNOSIS — Z23 Encounter for immunization: Secondary | ICD-10-CM

## 2011-04-07 DIAGNOSIS — E785 Hyperlipidemia, unspecified: Secondary | ICD-10-CM

## 2011-04-07 DIAGNOSIS — M949 Disorder of cartilage, unspecified: Secondary | ICD-10-CM

## 2011-04-07 DIAGNOSIS — Z87442 Personal history of urinary calculi: Secondary | ICD-10-CM

## 2011-04-07 DIAGNOSIS — Z0000 Encounter for general adult medical examination without abnormal findings: Secondary | ICD-10-CM

## 2011-04-07 DIAGNOSIS — Z853 Personal history of malignant neoplasm of breast: Secondary | ICD-10-CM

## 2011-04-07 DIAGNOSIS — Z85828 Personal history of other malignant neoplasm of skin: Secondary | ICD-10-CM

## 2011-04-07 MED ORDER — ZOSTER VACCINE LIVE 19400 UNT/0.65ML ~~LOC~~ SOLR: 0.6500 mL | Freq: Once | SUBCUTANEOUS | Status: DC

## 2011-04-07 NOTE — Patient Instructions (Addendum)
Please review Dr Gildardo Griffes book Eat, Drink & Be Healthy for dietary cholesterol information.  Please  schedule fasting Labs : Lipids, free T4, free T4,TSH  (272.4, thyroid nodule) Please bring these instructions to that Lab appt.

## 2011-04-07 NOTE — Progress Notes (Signed)
Subjective:    Patient ID: Whitney Donaldson, female    DOB: [birth year], 77 y.o.   MRN: [id number]  HPI Medicare Wellness Visit:  The following psychosocial & medical history were reviewed as required by Medicare.   Social history: caffeine: none , alcohol:  no ,  tobacco use : never  & exercise : 30 min 3X/ week as walking.   Home & personal  safety / fall risk: noissues, activities of daily living: no limitations , seatbelt use : yes , and smoke alarm employment : yes .  Power of Attorney/Living Will status : yes  Vision ( as recorded per Nurse) & Hearing  evaluation :  Last Ophth exam 3/12, new glasses. Wall chart read & whisper heard @ 6 ft Orientation :oriented X 3 , memory & recall :good, spelling  testing: good,and mood & affect : normal  . Depression / anxiety: denied Travel history : [place] , immunization status :Shingles needed , transfusion history:  no, and preventive health surveillance ( colonoscopies, BMD , etc as per protocol/ [facility]): colonoscopy due 2014, Dental care:  8/12 . Chart reviewed &  Updated. Active issues reviewed & addressed.       Review of Systems Dyslipidemia assessment: Prior Advanced Lipid Testing: NMR 2005.   Family history of premature CAD/ MI: Mother @ 45; bro @ 77 .  Nutrition: no plan .  Diabetes : no . HTN: no.   Weight :  stable. ROS: fatigue: some ; chest pain : no ;claudication: no; palpitations: no; abd pain/bowel changes: no ; myalgias:no;  syncope : no ; memory loss: no;skin changes: no.    Objective:   Physical Exam  Gen.: Thin but healthy and well-nourished in appearance. Alert, appropriate and cooperative throughout exam. Head: Normocephalic without obvious abnormalities Eyes: No corneal or conjunctival inflammation noted. Pupils equal round reactive to light and accommodation.  Extraocular motion intact.  Ears: External  ear exam reveals no significant lesions or deformities. Canals clear .TMs normal.  Nose: External nasal exam  reveals no deformity or inflammation. Nasal mucosa are pink and moist. No lesions or exudates noted.  Mouth: Oral mucosa and oropharynx reveal no lesions or exudates. Teeth in good repair. Neck: No deformities, masses, or tenderness noted. Range of motion &  Thyroid : small , ? Nodule on L. Lungs: Normal respiratory effort; chest expands symmetrically. Lungs are clear to auscultation without rales, wheezes, or increased work of breathing. Heart: Normal rate and rhythm. Normal S1 and S2. No gallop,  or rub. Click suggested @ apex w/o murmur. Abdomen: Bowel sounds normal; abdomen soft and nontender. No masses, organomegaly or hernias noted. Genitalia: Dr Tenny Craw   .                                                                                   Musculoskeletal/extremities: No deformity or scoliosis noted of  the thoracic or lumbar spine. No clubbing, cyanosis, edema  noted. Range of motion  normal .Tone & strength  Normal.Joints:mixed arthritic hand changes.Nail health  good. Vascular: Carotid, radial artery, dorsalis pedis and  posterior tibial pulses are full and equal. No bruits present. Neurologic: Alert and oriented x3. Deep  tendon reflexes symmetrical and normal.         Skin: Intact without suspicious lesions or rashes.Beign keratosis RUE Lymph: No cervical, axillary lymphadenopathy present. Psych: Mood and affect are normal. Normally interactive                                                                                       Assessment & Plan:  #1 Medicare Wellness Exam; criteria met ; data entered #2 Problem List reviewed ; Assessment/ Recommendations made  #3 possible left thyroid nodule  #4 family history of premature coronary disease  #5 dyslipidemia  Note: EKG WNL  Plan: see Orders

## 2011-04-14 ENCOUNTER — Ambulatory Visit
Admission: RE | Admit: 2011-04-14 | Discharge: 2011-04-14 | Disposition: A | Discharge status: Home / Self Care | Payer: Medicare Other | Source: Ambulatory Visit | Attending: Internal Medicine | Admitting: Internal Medicine

## 2011-04-14 DIAGNOSIS — E041 Nontoxic single thyroid nodule: Secondary | ICD-10-CM

## 2011-04-15 ENCOUNTER — Ambulatory Visit (INDEPENDENT_AMBULATORY_CARE_PROVIDER_SITE_OTHER): Payer: Medicare Other | Admitting: *Deleted

## 2011-04-15 DIAGNOSIS — Z23 Encounter for immunization: Secondary | ICD-10-CM

## 2011-04-21 ENCOUNTER — Other Ambulatory Visit: Payer: Self-pay | Admitting: Internal Medicine

## 2011-04-21 DIAGNOSIS — E785 Hyperlipidemia, unspecified: Secondary | ICD-10-CM

## 2011-04-22 ENCOUNTER — Other Ambulatory Visit (INDEPENDENT_AMBULATORY_CARE_PROVIDER_SITE_OTHER): Payer: Medicare Other

## 2011-04-22 DIAGNOSIS — E785 Hyperlipidemia, unspecified: Secondary | ICD-10-CM

## 2011-04-22 LAB — LIPID PANEL
Cholesterol: 209 mg/dL — ABNORMAL HIGH (ref 0–200)
HDL: 80.1 mg/dL (ref >39.00)
Triglycerides: 76 mg/dL (ref 0.0–149.0)

## 2011-04-22 NOTE — Progress Notes (Signed)
Labs only

## 2011-04-29 ENCOUNTER — Other Ambulatory Visit: Payer: Self-pay | Admitting: Obstetrics and Gynecology

## 2011-05-04 ENCOUNTER — Other Ambulatory Visit: Payer: Self-pay

## 2011-05-04 MED ORDER — ROSUVASTATIN CALCIUM 20 MG PO TABS: 20.0000 mg | ORAL_TABLET | Freq: Every day | ORAL | Status: DC

## 2011-05-04 NOTE — Telephone Encounter (Signed)
Rx sent to pharmacy, per patient request

## 2011-06-02 ENCOUNTER — Encounter: Payer: Self-pay | Admitting: Internal Medicine

## 2011-07-30 ENCOUNTER — Ambulatory Visit (INDEPENDENT_AMBULATORY_CARE_PROVIDER_SITE_OTHER): Payer: Medicare Other

## 2011-07-30 DIAGNOSIS — Z23 Encounter for immunization: Secondary | ICD-10-CM

## 2011-09-21 ENCOUNTER — Telehealth: Payer: Self-pay | Admitting: Internal Medicine

## 2011-09-21 NOTE — Telephone Encounter (Signed)
Caller: Doyce/Patient; PCP: Marga Melnick; CB#: [phone]; ; ; Call regarding Cough/Congestion;   Patient states she developed cough, head congestion. Onset 09/17/11. Afebrile. Triage per URI Protocol. No emergent sx identified. Care advice given per guidelines. Patient advised OTC Mucinex, increased fluids, warm fluids with honey, saline nasal washes/netty pot. Patient advised warm comprsses to face, inhaled steam, humidifier. Call back parameters reviewed. Patient verbalizes understanding.

## 2011-09-22 ENCOUNTER — Ambulatory Visit (INDEPENDENT_AMBULATORY_CARE_PROVIDER_SITE_OTHER): Payer: Medicare Other | Admitting: Internal Medicine

## 2011-09-22 ENCOUNTER — Encounter: Payer: Self-pay | Admitting: Internal Medicine

## 2011-09-22 VITALS — BP 110/68 | HR 84 | Temp 98.0°F | Wt 116.0 lb

## 2011-09-22 DIAGNOSIS — J209 Acute bronchitis, unspecified: Secondary | ICD-10-CM

## 2011-09-22 DIAGNOSIS — J069 Acute upper respiratory infection, unspecified: Secondary | ICD-10-CM

## 2011-09-22 MED ORDER — BENZONATATE 200 MG PO CAPS: 200.0000 mg | ORAL_CAPSULE | Freq: Three times a day (TID) | ORAL | Status: AC | PRN

## 2011-09-22 MED ORDER — AMOXICILLIN 500 MG PO CAPS: 500.0000 mg | ORAL_CAPSULE | Freq: Three times a day (TID) | ORAL | Status: AC

## 2011-09-22 NOTE — Patient Instructions (Signed)
Take Allegra 160 mg a day; this is a nonsedating antihistamine or take Zyrtec at bedtime,this tends to be sedating. Plain Mucinex for thick secretions ;force NON dairy fluids . Use a Neti pot daily as needed for sinus congestion. Nasal cleansing in the shower as discussed. Make sure that all residual soap is removed to prevent irritation.

## 2011-09-22 NOTE — Progress Notes (Signed)
  Subjective:    Patient ID: Whitney Donaldson, female    DOB: 07-24-1932, 78 y.o.   MRN: [id number]  HPI Respiratory tract infection Onset/symptoms:09/17/11 as head congestion Exposures (illness/environmental/extrinsic):grandson ill Progression of symptoms:to cough Treatments/response:Mucinex ? beneficial Present symptoms: Fever/chills/sweats:no Frontal headache:no Facial pain:no Nasal purulence:yellow Sore throat:? From cough Dental pain:no Lymphadenopathy:no Wheezing/shortness of breath:a few days ago Cough/sputum/hemoptysis:yellow, = volume from head & chest Pleuritic pain:no Associated extrinsic/allergic symptoms:itchy eyes/ sneezing:sneezing 2 days ago Past medical history: Seasonal allergies: no/asthma:no          Review of Systems     Objective:   Physical Exam General appearance:thin but in good health ;well nourished; no acute distress or increased work of breathing is present.  No  lymphadenopathy about the head, neck, or axilla noted.   Eyes: No conjunctival inflammation or lid edema is present.  Ears:  External ear exam shows no significant lesions or deformities.  Otoscopic examination reveals clear canals, tympanic membranes are intact bilaterally without bulging, retraction, inflammation or discharge.  Nose:  External nasal examination shows no deformity or inflammation. Nasal mucosa are pink and moist without lesions or exudates. No septal dislocation or deviation.No obstruction to airflow.  Hyponasal tone  Oral exam: Dental hygiene is good; lips and gums are healthy appearing.There is no oropharyngeal erythema or exudate noted.     Heart:  Normal rate and regular rhythm. S1 and S2 normal without gallop, murmur, click, rub . S 4 Lungs:Chest clear to auscultation; no wheezes, rhonchi,rales ,or rubs present.No increased work of breathing.    Extremities:  No cyanosis, edema, or clubbing  noted    Skin: Warm & dry           Assessment & Plan:  #1  acute bronchitis w/o bronchospasm #2 URI Plan: See orders and recommendations

## 2011-12-06 ENCOUNTER — Ambulatory Visit: Payer: Medicare Other | Admitting: Hematology & Oncology

## 2011-12-06 ENCOUNTER — Other Ambulatory Visit: Payer: Medicare Other | Admitting: Lab

## 2011-12-13 ENCOUNTER — Other Ambulatory Visit (HOSPITAL_BASED_OUTPATIENT_CLINIC_OR_DEPARTMENT_OTHER): Payer: Medicare Other | Admitting: Lab

## 2011-12-13 ENCOUNTER — Ambulatory Visit (HOSPITAL_BASED_OUTPATIENT_CLINIC_OR_DEPARTMENT_OTHER): Payer: Medicare Other | Admitting: Hematology & Oncology

## 2011-12-13 VITALS — BP 114/57 | HR 79 | Temp 97.4°F | Ht 63.0 in | Wt 115.0 lb

## 2011-12-13 DIAGNOSIS — R634 Abnormal weight loss: Secondary | ICD-10-CM

## 2011-12-13 DIAGNOSIS — Z853 Personal history of malignant neoplasm of breast: Secondary | ICD-10-CM

## 2011-12-13 DIAGNOSIS — C50919 Malignant neoplasm of unspecified site of unspecified female breast: Secondary | ICD-10-CM

## 2011-12-13 LAB — CBC WITH DIFFERENTIAL (CANCER CENTER ONLY)
BASO#: 0 10*3/uL (ref 0.0–0.2)
Eosinophils Absolute: 0 10*3/uL (ref 0.0–0.5)
HCT: 37.8 % (ref 34.8–46.6)
HGB: 12.8 g/dL (ref 11.6–15.9)
LYMPH%: 28.9 % (ref 14.0–48.0)
MCH: 31.8 pg (ref 26.0–34.0)
MCV: 94 fL (ref 81–101)
MONO#: 0.6 10*3/uL (ref 0.1–0.9)
MONO%: 12.6 % (ref 0.0–13.0)
RBC: 4.02 10*6/uL (ref 3.70–5.32)

## 2011-12-13 NOTE — Progress Notes (Signed)
This office note has been dictated.

## 2011-12-14 LAB — COMPREHENSIVE METABOLIC PANEL
ALT: 19 U/L (ref 0–35)
CO2: 28 mEq/L (ref 19–32)
Creatinine, Ser: 0.67 mg/dL (ref 0.50–1.10)
Total Bilirubin: 0.4 mg/dL (ref 0.3–1.2)

## 2011-12-14 LAB — VITAMIN D 25 HYDROXY (VIT D DEFICIENCY, FRACTURES): Vit D, 25-Hydroxy: 51 ng/mL (ref 30–89)

## 2011-12-14 NOTE — Progress Notes (Signed)
CC:   Whitney Donaldson. Whitney Ren, MD,FACP,FCCP Whitney Donaldson, M.D.  DIAGNOSIS:  Stage I (T1 N0 M0) ductal carcinoma of the left breast.  CURRENT THERAPY:  Observation.  INTERVAL HISTORY:  Whitney Donaldson comes in for her yearly followup.  It has now been 11 years since she had her breast cancer diagnosed.  She is doing well.  She is worried about weight loss.  She has only lost weight since we last saw her a year ago.  Her appetite is okay.  She was at the beach.  She says she ate a lot and yet did not gain any weight.  She has had no change in her medications.  She is not taking any thyroid medication.  We will go ahead and check her TSH.  She has had no problems bleeding or bruising.  There has been no change in bowel or bladder habits.  Her last mammogram was done back in December.  Everything looked okay on the mammogram with no evidence of suspicious findings.  PHYSICAL EXAMINATION:  General: This is a thin, but petite, elderly, white female in no obvious distress.  Vital signs:  Temperature of 97.4, pulse 79, respiratory rate 18, blood pressure 114/57.  Weight is 115. Head and neck exam: Shows a normocephalic, atraumatic skull.  There are no ocular or oral lesions.  There are no palpable, cervical or supraclavicular lymph nodes.  Lungs:  Clear bilaterally.  Cardiac: Regular rate and rhythm with normal S1, S2.  There are no murmurs, rubs or bruits.  Abdomen:  Soft with good bowel sounds.  There is no palpable abdominal mass.  There is no fluid wave.  There was no palpable hepatosplenomegaly.  Breasts:  Show right breast with no masses, edema or erythema.  There is no right axillary adenopathy.  Left breast shows well-healed lumpectomy at the 1 o'clock position.  The lumpectomy site is barely visible or palpable.  There may be a little bit of firmness at the lumpectomy site.  No nodules noted.  There is no left axillary adenopathy.  Back:  No tenderness over the spine, ribs, or hips.   There is no kyphosis.  Abdomen:  Soft with good bowel sounds.  There is no fluid wave.  There is no hepatosplenomegaly.  Extremities:  Shows no clubbing, cyanosis or edema.  She has good range of motion of the joints.  Skin:  No rashes, ecchymosis or petechia.  Neurologic:  No focal neurological deficits.  LABORATORY STUDIES:  White cell count is 5, hemoglobin 12.8, hematocrit 37.8, platelet count 173.  IMPRESSION:  Whitney Donaldson is a 78 year old white female with stage I ductal carcinoma of the left breast.  She is doing well with this.  She is 11 years out now.  I do not see any evidence of obvious recurrence.  We will go ahead and order a TSH for her.  We will see what the rest of her lab work looks like.  I told her to try some vitamin C and vitamin B12.  This may make her feel a little bit better.  I do not see need for any scans right now.  We will still plan for yearly followup.  She is going to see Dr. Alwyn Donaldson for a regular visit.  She will discuss with him some of the issues that she has.    ______________________________ Josph Macho, M.D. PRE/MEDQ  D:  12/13/2011  T:  12/14/2011  Job:  [id number]

## 2011-12-23 ENCOUNTER — Ambulatory Visit: Payer: Medicare Other | Admitting: Internal Medicine

## 2011-12-23 ENCOUNTER — Ambulatory Visit (INDEPENDENT_AMBULATORY_CARE_PROVIDER_SITE_OTHER): Payer: Medicare Other | Admitting: Family Medicine

## 2011-12-23 ENCOUNTER — Encounter: Payer: Self-pay | Admitting: Family Medicine

## 2011-12-23 VITALS — BP 132/82 | HR 84 | Temp 98.5°F | Wt 115.8 lb

## 2011-12-23 DIAGNOSIS — H5789 Other specified disorders of eye and adnexa: Secondary | ICD-10-CM

## 2011-12-23 NOTE — Progress Notes (Signed)
  Subjective:    Patient ID: Whitney Donaldson, female    DOB: 1932-11-24, 78 y.o.   MRN: [id number]  HPI Pt here c/o red eye.  She was seen by her eye Dr 2 weeks ago because the opposite eye was red --- she was told it was a blood vessel.  She noticed a day or 2 ago that the other eye was red.  She went to the oral surgeon with her husband and he checked her bp---  144/70 and he told her to watch it.   She has been checking bp at home and today it was 140/71.   No headaches, cp , sob.     Review of Systems As above    Objective:   Physical Exam  Eyes: Lids are normal. No foreign bodies found. Right eye exhibits no chemosis, no discharge, no exudate and no hordeolum. No foreign body present in the right eye. Left eye exhibits no chemosis, no discharge, no exudate and no hordeolum. No foreign body present in the left eye. Right conjunctiva is injected. No scleral icterus. Right pupil is round and reactive. Left pupil is round and reactive.            Assessment & Plan:  Red eye from broken blood vessel---- benign but since it is the second time in 2 weeks we will leg optho---Dr Hyacinth Meeker know                                             bp is higher than usual but not high enough to treat and pt is anxious about the eye.  Reassured pt and we will call her when we hear back from Dr Garen Lah office

## 2012-01-28 ENCOUNTER — Other Ambulatory Visit: Payer: Self-pay

## 2012-01-28 MED ORDER — LORAZEPAM 0.5 MG PO TABS: 0.5000 mg | ORAL_TABLET | Freq: Three times a day (TID) | ORAL | Status: DC | PRN

## 2012-04-19 ENCOUNTER — Encounter: Payer: Self-pay | Admitting: Internal Medicine

## 2012-04-20 ENCOUNTER — Telehealth: Payer: Self-pay | Admitting: Hematology & Oncology

## 2012-04-20 NOTE — Telephone Encounter (Signed)
Left pt message moved 6-16 to 6-24

## 2012-05-29 ENCOUNTER — Telehealth: Payer: Self-pay

## 2012-05-29 MED ORDER — ROSUVASTATIN CALCIUM 20 MG PO TABS: 20.0000 mg | ORAL_TABLET | Freq: Every day | ORAL | Status: DC

## 2012-05-29 NOTE — Telephone Encounter (Signed)
Pt LMOVM requesting CB concerning a Rx. ( no other info provided)  PLz advise pt     CB# [id number] MW

## 2012-05-29 NOTE — Telephone Encounter (Signed)
Pt states that she is out of her crestor and need Rx sent to local pharmacy. Pt aware Rx sent and that she is due for CPX and labs. Pt scheduled for appt.

## 2012-05-29 NOTE — Telephone Encounter (Signed)
Called patient on provided number, mailbox is full, unable to leave message. I will try to reach patient later

## 2012-06-28 HISTORY — PX: OTHER SURGICAL HISTORY: SHX169

## 2012-08-21 ENCOUNTER — Encounter: Payer: Self-pay | Admitting: Lab

## 2012-08-22 ENCOUNTER — Ambulatory Visit (INDEPENDENT_AMBULATORY_CARE_PROVIDER_SITE_OTHER): Payer: Medicare Other | Admitting: Internal Medicine

## 2012-08-22 ENCOUNTER — Encounter: Payer: Self-pay | Admitting: Internal Medicine

## 2012-08-22 VITALS — BP 130/72 | HR 85 | Resp 14 | Ht 62.5 in | Wt 117.0 lb

## 2012-08-22 DIAGNOSIS — Z0000 Encounter for general adult medical examination without abnormal findings: Secondary | ICD-10-CM

## 2012-08-22 DIAGNOSIS — Z23 Encounter for immunization: Secondary | ICD-10-CM

## 2012-08-22 DIAGNOSIS — E785 Hyperlipidemia, unspecified: Secondary | ICD-10-CM

## 2012-08-22 DIAGNOSIS — M899 Disorder of bone, unspecified: Secondary | ICD-10-CM

## 2012-08-22 LAB — LIPID PANEL
Cholesterol: 201 mg/dL — ABNORMAL HIGH (ref 0–200)
HDL: 75.4 mg/dL (ref >39.00)
Triglycerides: 81 mg/dL (ref 0.0–149.0)

## 2012-08-22 LAB — BASIC METABOLIC PANEL
BUN: 18 mg/dL (ref 6–23)
CO2: 29 mEq/L (ref 19–32)
Calcium: 9.5 mg/dL (ref 8.4–10.5)
Creatinine, Ser: 0.6 mg/dL (ref 0.4–1.2)
GFR: 95.37 mL/min (ref >60.00)
Glucose, Bld: 87 mg/dL (ref 70–99)

## 2012-08-22 LAB — HEPATIC FUNCTION PANEL
Albumin: 4.4 g/dL (ref 3.5–5.2)
Total Protein: 7.3 g/dL (ref 6.0–8.3)

## 2012-08-22 LAB — LDL CHOLESTEROL, DIRECT: Direct LDL: 115.1 mg/dL

## 2012-08-22 MED ORDER — LORAZEPAM 0.5 MG PO TABS: 0.5000 mg | ORAL_TABLET | Freq: Three times a day (TID) | ORAL | Status: DC | PRN

## 2012-08-22 MED ORDER — ROSUVASTATIN CALCIUM 20 MG PO TABS: 20.0000 mg | ORAL_TABLET | Freq: Every day | ORAL | Status: DC

## 2012-08-22 NOTE — Patient Instructions (Addendum)
Preventive Health Care: Exercise at least 30-45 minutes a day,  3-4 days a week.  Eat a low-fat diet with lots of fruits and vegetables, up to 7-9 servings per day. This would eliminate the need for vitamin supplements.  If you activate the  My Chart system; lab & Xray results will be released directly  to you as soon as I review & address these through the computer.As per [place] all records must be reviewed and signed off within 72 hours; but I attempt to complete this within 36 hours unless I have no access to the electronic medical record.If I wait more than 36 hours the volume of reports becomes difficult to manage optimally. In my absence ;my partners would address the results.Critical lab results will be communicated to you ASAP. If you choose not to sign up for My Chart within 36 hours of labs being drawn; results will be reviewed & interpretation added before being copied & mailed, causing a delay in getting the results to you.If you do not receive that report within 7-10 days ,please call. Additionally you can use this system to gain direct  access to your records  if  out of town or @ an office of a  physician who is not in  the My Chart network.  This improves continuity of care & places you in control of your medical record.

## 2012-08-22 NOTE — Progress Notes (Signed)
Subjective:    Patient ID: Whitney Donaldson, female    DOB: Apr 12, 1933, 79 y.o.   MRN: [id number]  HPI Medicare Wellness Visit:  Psychosocial & medical history were reviewed as required by Medicare (abuse,antisocial behavioral risks,firearm risk).  Social history: caffeine:none  , alcohol: none  ,  tobacco FAO:[id number]    Exercise :  See below No home & personal  safety / fall risk Activities of daily living: no limitations  Seatbelt  and smoke alarm employed. Power of Attorney/Living Will status : in place Ophthalmology exam pending Hearing evaluation not current Orientation :oriented X 3  Memory & recall :good Spelling  testing:good Mood & affect : normal . Depression / anxiety: denied Travel history : last [place] 2008 Immunization status : tetanus today Transfusion history:  none  Preventive health surveillance ( colonoscopies, BMD , mammograms,PAP as per protocol/ SOC): current  mammogram & PAP. ? Colonoscopy & BMD  due Dental care:  Every 6 mos. Chart reviewed &  Updated. Active issues reviewed & addressed.      Review of Systems She is on a heart healthy diet; she exercises as walking> 20 minutes 3 times per week without symptoms. Specifically she denies chest pain, palpitations, dyspnea, or claudication. Family history is positive for premature coronary disease in he mother. Advanced cholesterol testing reveals her LDL goal is less than 115. There is medication compliance with the statin. Significant abdominal symptoms, memory deficit, or myalgias denied. .    Objective:   Physical Exam Gen.: Thin but healthy and well-nourished in appearance. Alert, appropriate and cooperative throughout exam. Appears younger than stated age  Head: Normocephalic without obvious abnormalities Eyes: No corneal or conjunctival inflammation noted. Pupils equal round reactive to light and accommodation. Fundal exam is benign without hemorrhages, exudate, papilledema. Extraocular motion intact.  Vision grossly normal with lenses Ears: External  ear exam reveals no significant lesions or deformities. Canals clear .TMs normal. Hearing is grossly normal bilaterally. Nose: External nasal exam reveals no deformity or inflammation. Nasal mucosa are pink and moist. No lesions or exudates noted.   Mouth: Oral mucosa and oropharynx reveal no lesions or exudates. Teeth in good repair. Neck: No deformities, masses, or tenderness noted. Range of motion decreased. Thyroid normal. Lungs: Normal respiratory effort; chest expands symmetrically. Lungs are clear to auscultation without rales, wheezes, or increased work of breathing. Heart: Normal rate and rhythm. Normal S1 and S2. No gallop, click, or rub. S4 with slurring; no significant murmur. Abdomen: Bowel sounds normal; abdomen soft and nontender. No masses, organomegaly or hernias noted. Genitalia: As per Dr Patricia Pesa                                  Musculoskeletal/extremities: No deformity or scoliosis noted of  the thoracic or lumbar spine.  No clubbing, cyanosis, or edema noted. Range of motion normal .Tone & strength  Normal. Joints  reveal fusiform PIP changes. Nail health good. Able to lie down & sit up w/o help. Negative SLR bilaterally Vascular: Carotid, radial artery, dorsalis pedis and  posterior tibial pulses are full and equal. No bruits present. Neurologic: Alert and oriented x3. Deep tendon reflexes symmetrical and normal.     Skin: Intact without suspicious lesions or rashes. Lymph: No cervical, axillary lymphadenopathy present. Psych: Mood and affect are normal. Normally interactive  Assessment & Plan:  #1 Medicare Wellness Exam; criteria met ; data entered #2 Problem List reviewed ; Assessment/ Recommendations made Plan: see Orders

## 2012-08-28 ENCOUNTER — Other Ambulatory Visit: Payer: Medicare Other

## 2012-09-12 ENCOUNTER — Other Ambulatory Visit: Payer: Medicare Other

## 2012-09-13 ENCOUNTER — Encounter: Payer: Self-pay | Admitting: Internal Medicine

## 2012-09-19 ENCOUNTER — Ambulatory Visit (INDEPENDENT_AMBULATORY_CARE_PROVIDER_SITE_OTHER)
Admission: RE | Admit: 2012-09-19 | Discharge: 2012-09-19 | Disposition: A | Discharge status: Home / Self Care | Payer: Medicare Other | Source: Ambulatory Visit | Attending: Internal Medicine | Admitting: Internal Medicine

## 2012-09-19 DIAGNOSIS — M899 Disorder of bone, unspecified: Secondary | ICD-10-CM

## 2012-11-27 ENCOUNTER — Encounter: Payer: Self-pay | Admitting: Gastroenterology

## 2012-12-11 ENCOUNTER — Ambulatory Visit: Payer: Medicare Other | Admitting: Hematology & Oncology

## 2012-12-11 ENCOUNTER — Other Ambulatory Visit: Payer: Medicare Other | Admitting: Lab

## 2012-12-18 ENCOUNTER — Other Ambulatory Visit (HOSPITAL_BASED_OUTPATIENT_CLINIC_OR_DEPARTMENT_OTHER): Payer: Medicare Other | Admitting: Lab

## 2012-12-18 ENCOUNTER — Ambulatory Visit (HOSPITAL_BASED_OUTPATIENT_CLINIC_OR_DEPARTMENT_OTHER): Payer: Medicare Other | Admitting: Medical

## 2012-12-18 VITALS — BP 108/47 | HR 62 | Temp 98.0°F | Resp 16 | Ht 62.0 in | Wt 115.0 lb

## 2012-12-18 DIAGNOSIS — Z853 Personal history of malignant neoplasm of breast: Secondary | ICD-10-CM

## 2012-12-18 LAB — CBC WITH DIFFERENTIAL (CANCER CENTER ONLY)
BASO#: 0 10*3/uL (ref 0.0–0.2)
Eosinophils Absolute: 0 10*3/uL (ref 0.0–0.5)
HCT: 38.8 % (ref 34.8–46.6)
HGB: 13 g/dL (ref 11.6–15.9)
LYMPH%: 25.9 % (ref 14.0–48.0)
MCH: 32.1 pg (ref 26.0–34.0)
MCV: 96 fL (ref 81–101)
MONO#: 0.5 10*3/uL (ref 0.1–0.9)
MONO%: 11.6 % (ref 0.0–13.0)
NEUT%: 61.6 % (ref 39.6–80.0)
RBC: 4.05 10*6/uL (ref 3.70–5.32)

## 2012-12-18 NOTE — Progress Notes (Signed)
DIAGNOSIS:  Stage I (T1 N0 M0) ductal carcinoma of the left breast.  CURRENT THERAPY:  Observation.  INTERVAL HISTORY: [name] presents today for her yearly office followup visit.  He is now been about 12 years she had breast cancer diagnosed.  Overall she is doing well.  She is active.  She's not reported any new problems.  She's not had any new medications.  Her last mammogram was done back in October.  Everything looked okay with no evidence of suspicious findings.  She's not reporting any type of bony pain.  She reports a good appetite.  She denies any unintentional weight loss.  She denies any nausea, vomiting, diarrhea or constipation.  She denies any fevers, chills or night sweats.  She denies any chest pain, shortness of breath or cough.  She denies any abdominal pain.  She denies any obvious or abnormal bleeding.  She denies any lower leg swelling.  She denies any headaches, visual changes or rashes.  Review of Systems: Constitutional:Negative for malaise/fatigue, fever, chills, weight loss, diaphoresis, activity change, appetite change, and unexpected weight change.  HEENT: Negative for double vision, blurred vision, visual loss, ear pain, tinnitus, congestion, rhinorrhea, epistaxis sore throat or sinus disease, oral pain/lesion, tongue soreness Respiratory: Negative for cough, chest tightness, shortness of breath, wheezing and stridor.  Cardiovascular: Negative for chest pain, palpitations, leg swelling, orthopnea, PND, DOE or claudication Gastrointestinal: Negative for nausea, vomiting, abdominal pain, diarrhea, constipation, blood in stool, melena, hematochezia, abdominal distention, anal bleeding, rectal pain, anorexia and hematemesis.  Genitourinary: Negative for dysuria, frequency, hematuria,  Musculoskeletal: Negative for myalgias, back pain, joint swelling, arthralgias and gait problem.  Skin: Negative for rash, color change, pallor and wound.  Neurological:. Negative for  dizziness/light-headedness, tremors, seizures, syncope, facial asymmetry, speech difficulty, weakness, numbness, headaches and paresthesias.  Hematological: Negative for adenopathy. Does not bruise/bleed easily.  Psychiatric/Behavioral:  Negative for depression, no loss of interest in normal activity or change in sleep pattern.   Physical Exam: This is a pleasant 79 year old petite elderly female in no obvious distress Vitals: Temperature 98.2 degrees pulse 81 respirations 16 blood pressure 108/47 weight 115 pounds HEENT reveals a normocephalic, atraumatic skull, no scleral icterus, no oral lesions  Neck is supple without any cervical or supraclavicular adenopathy.  Lungs are clear to auscultation bilaterally. There are no wheezes, rales or rhonci Cardiac is regular rate and rhythm with a normal S1 and S2. There are no murmurs, rubs, or bruits.  Abdomen is soft with good bowel sounds, there is no palpable mass. There is no palpable hepatosplenomegaly. There is no palpable fluid wave.  Musculoskeletal no tenderness of the spine, ribs, or hips.  Extremities there are no clubbing, cyanosis, or edema.  Skin no petechia, purpura or ecchymosis Neurologic is nonfocal.  Laboratory Data: White count 4.7 hemoglobin 13.0 hematocrit 38.8 as well as 165,000  Current Outpatient Prescriptions on File Prior to Visit  Medication Sig Dispense Refill  . aspirin 81 MG tablet Take 81 mg by mouth daily.        . Calcium Carbonate-Vitamin D (CALCIUM + D PO) Take by mouth 2 (two) times daily.       . Cholecalciferol (VITAMIN D3) 1000 UNITS CAPS Take by mouth. 2 by mouth daily      . cyanocobalamin 500 MCG tablet Take 500 mcg by mouth daily.      . DHA-EPA-Coenzyme Q10-Vitamin E (COQ-10 & FISH OIL PO) Take by mouth.        . FOLIC ACID  PO Take by mouth.      . Multiple Vitamin (MULTIVITAMIN) tablet Take 1 tablet by mouth daily.        . rosuvastatin (CRESTOR) 20 MG tablet Take 1 tablet (20 mg total) by mouth at  bedtime.  90 tablet  3  . Vitamin E 400 UNITS TABS Take by mouth daily.       No current facility-administered medications on file prior to visit.   Assessment/Plan: This is a pleasant 79 year old white female with the following issues:  #1.  History of stage 1 ductal carcinoma of the left breast.  She is now 12 years out.  I do not see any evidence of obvious recurrence.  She will continue with   yearly bilateral mammograms.  #2.  Followup.  [name] will follow back up with [place] in one year but before then should there be questions or concerns.

## 2012-12-19 LAB — COMPREHENSIVE METABOLIC PANEL
ALT: 16 U/L (ref 0–35)
AST: 17 U/L (ref 0–37)
Albumin: 3.9 g/dL (ref 3.5–5.2)
BUN: 22 mg/dL (ref 6–23)
CO2: 27 mEq/L (ref 19–32)
Calcium: 9.1 mg/dL (ref 8.4–10.5)
Creatinine, Ser: 0.69 mg/dL (ref 0.50–1.10)
Glucose, Bld: 118 mg/dL — ABNORMAL HIGH (ref 70–99)
Total Protein: 6.3 g/dL (ref 6.0–8.3)

## 2013-01-26 DIAGNOSIS — D126 Benign neoplasm of colon, unspecified: Secondary | ICD-10-CM

## 2013-01-26 HISTORY — DX: Benign neoplasm of colon, unspecified: D12.6

## 2013-01-30 ENCOUNTER — Ambulatory Visit (AMBULATORY_SURGERY_CENTER): Payer: Medicare Other

## 2013-01-30 VITALS — Ht 64.5 in | Wt 112.4 lb

## 2013-01-30 DIAGNOSIS — Z1211 Encounter for screening for malignant neoplasm of colon: Secondary | ICD-10-CM

## 2013-01-30 MED ORDER — MOVIPREP 100 G PO SOLR: ORAL | Status: DC

## 2013-01-31 ENCOUNTER — Encounter: Payer: Self-pay | Admitting: Gastroenterology

## 2013-02-01 ENCOUNTER — Ambulatory Visit (INDEPENDENT_AMBULATORY_CARE_PROVIDER_SITE_OTHER): Payer: Medicare Other | Admitting: Internal Medicine

## 2013-02-01 ENCOUNTER — Encounter: Payer: Self-pay | Admitting: Internal Medicine

## 2013-02-01 ENCOUNTER — Ambulatory Visit: Payer: Medicare Other | Admitting: Internal Medicine

## 2013-02-01 VITALS — BP 122/70 | HR 90 | Temp 98.0°F | Wt 111.0 lb

## 2013-02-01 DIAGNOSIS — M546 Pain in thoracic spine: Secondary | ICD-10-CM

## 2013-02-01 DIAGNOSIS — Z853 Personal history of malignant neoplasm of breast: Secondary | ICD-10-CM

## 2013-02-01 DIAGNOSIS — R351 Nocturia: Secondary | ICD-10-CM

## 2013-02-01 DIAGNOSIS — R634 Abnormal weight loss: Secondary | ICD-10-CM

## 2013-02-01 DIAGNOSIS — R5381 Other malaise: Secondary | ICD-10-CM

## 2013-02-01 DIAGNOSIS — R5383 Other fatigue: Secondary | ICD-10-CM

## 2013-02-01 LAB — POCT URINALYSIS DIPSTICK
Bilirubin, UA: NEGATIVE
Leukocytes, UA: NEGATIVE
pH, UA: 6

## 2013-02-01 NOTE — Progress Notes (Signed)
Subjective:    Patient ID: Whitney Donaldson, female    DOB: [birth year], 79 y.o.   MRN: [id number]  HPI   She has several concerns; primary issue is non radiating aching back pain in midthoracic area over the last several months.  It does improve at night with rest. She questions whether it might be related to posture as she "slumps" .Lorazepam may have been somewhat of some benefit  She has lost 4 pounds since June when she saw her oncologist. Labs performed at that time were reviewed. Normal or negative studies included vitamin D level, CBC and differential, chemistries, ferritin, and TSH. Non-fasting glucose was 118. She has a colonoscopy scheduled next week.  She also has associated fatigue  She has intermittent nocturia up to 3-4 times per night  Significant past medical history includes dermatologic and breast malignancy.    Review of Systems  Weight loss has not been associated with fever, chills, or sweats.  She has no associated rash in the area back pain. She denies limb pain, weakness, numbness, or tingling. She has no incontinence of urine or stool  She has no significant dyspepsia, dysphagia, abdominal pain, melena, rectal bleeding  She also denies dysuria, pyuria, or hematuria.  She denies severe snoring or apnea.     Objective:   Physical Exam Gen.: Thin but healthy and well-nourished in appearance. Alert, appropriate and cooperative throughout exam.Appears younger than stated age  Head: Normocephalic without obvious abnormalities Eyes: No corneal or conjunctival inflammation noted.No icterus Nose: External nasal exam reveals no deformity or inflammation. Nasal mucosa are pink and moist. No lesions or exudates noted.   Mouth: Oral mucosa and oropharynx reveal no lesions or exudates. Teeth in good repair. Neck: No deformities, masses, or tenderness noted. Range of motion & Thyroid small. Lungs: Normal respiratory effort; chest expands symmetrically. Lungs are clear  to auscultation without rales, wheezes, or increased work of breathing. Heart: Normal rate and rhythm. Accentuated  S1 ; normal S2. No gallop, click, or rub. No murmur. Abdomen: Bowel sounds normal; abdomen soft and nontender. No masses, organomegaly or hernias noted.Aorta palpable ; no AAA Genitalia:    As per Gyn                                  Musculoskeletal/extremities: No deformity or scoliosis noted of  the thoracic or lumbar spine.  No pain to percussion over the mid thoracic spine No clubbing, cyanosis,or  edema. Range of motion normal .Tone & strength  Normal. Joints reveal  mixed but mainly PIP  changes. Nail health good. Able to lie down & sit up w/o help. Negative SLR bilaterally Vascular: Carotid, radial artery, dorsalis pedis and  posterior tibial pulses are full and equal. No bruits present. Neurologic: Alert and oriented x3. Deep tendon reflexes symmetrical and normal.        Skin: Intact without suspicious lesions or rashes. Lymph: No cervical, axillary lymphadenopathy present. Psych: Mood and affect are normal. Normally interactive  Assessment & Plan:  #1 midthoracic spine discomfort probably degenerative change aggravated by posture  #2 fatigue with normal CBC and differential and thyroid function. EKG normal  #3 mild weight loss; colonoscopy pending  #4 nocturia  Plan see orders and recommendations

## 2013-02-01 NOTE — Patient Instructions (Addendum)
Order for x-rays entered into  the computer; these will be performed at [address] across from [facility]. No appointment is necessary. 

## 2013-02-13 ENCOUNTER — Ambulatory Visit (AMBULATORY_SURGERY_CENTER): Payer: Medicare Other | Admitting: Gastroenterology

## 2013-02-13 ENCOUNTER — Encounter: Payer: Self-pay | Admitting: Gastroenterology

## 2013-02-13 VITALS — BP 147/68 | HR 82 | Temp 96.8°F | Resp 16 | Ht 64.5 in | Wt 112.0 lb

## 2013-02-13 DIAGNOSIS — Z1211 Encounter for screening for malignant neoplasm of colon: Secondary | ICD-10-CM

## 2013-02-13 DIAGNOSIS — D126 Benign neoplasm of colon, unspecified: Secondary | ICD-10-CM

## 2013-02-13 MED ORDER — SODIUM CHLORIDE 0.9 % IV SOLN: 500.0000 mL | INTRAVENOUS | Status: DC

## 2013-02-13 NOTE — Patient Instructions (Addendum)
YOU HAD AN ENDOSCOPIC PROCEDURE TODAY AT THE [facility]: Refer to the procedure report that was given to you for any specific questions about what was found during the examination.  If the procedure report does not answer your questions, please call your gastroenterologist to clarify.  If you requested that your care partner not be given the details of your procedure findings, then the procedure report has been included in a sealed envelope for you to review at your convenience later.  YOU SHOULD EXPECT: Some feelings of bloating in the abdomen. Passage of more gas than usual.  Walking can help get rid of the air that was put into your GI tract during the procedure and reduce the bloating. If you had a lower endoscopy (such as a colonoscopy or flexible sigmoidoscopy) you may notice spotting of blood in your stool or on the toilet paper. If you underwent a bowel prep for your procedure, then you may not have a normal bowel movement for a few days.  DIET: Your first meal following the procedure should be a light meal and then it is ok to progress to your normal diet.  A half-sandwich or bowl of soup is an example of a good first meal.  Heavy or fried foods are harder to digest and may make you feel nauseous or bloated.  Likewise meals heavy in dairy and vegetables can cause extra gas to form and this can also increase the bloating.  Drink plenty of fluids but you should avoid alcoholic beverages for 24 hours.  ACTIVITY: Your care partner should take you home directly after the procedure.  You should plan to take it easy, moving slowly for the rest of the day.  You can resume normal activity the day after the procedure however you should NOT DRIVE or use heavy machinery for 24 hours (because of the sedation medicines used during the test).    SYMPTOMS TO REPORT IMMEDIATELY: A gastroenterologist can be reached at any hour.  During normal business hours, 8:30 AM to 5:00 PM Monday through Friday,  call [phone].  After hours and on weekends, please call the GI answering service at [phone] who will take a message and have the physician on call contact you.   Following lower endoscopy (colonoscopy or flexible sigmoidoscopy):  Excessive amounts of blood in the stool  Significant tenderness or worsening of abdominal pains  Swelling of the abdomen that is new, acute  Following upp  FOLLOW UP: If any biopsies were taken you will be contacted by phone or by letter within the next 1-3 weeks.  Call your gastroenterologist if you have not heard about the biopsies in 3 weeks.  Our staff will call the home number listed on your records the next business day following your procedure to check on you and address any questions or concerns that you may have at that time regarding the information given to you following your procedure. This is a courtesy call and so if there is no answer at the home number and we have not heard from you through the emergency physician on call, we will assume that you have returned to your regular daily activities without incident.  SIGNATURES/CONFIDENTIALITY: You and/or your care partner have signed paperwork which will be entered into your electronic medical record.  These signatures attest to the fact that that the information above on your After Visit Summary has been reviewed and is understood.  Full responsibility of the confidentiality of this discharge information lies  with you and/or your care-partner.   Polyp, diverticulosis, high fiber diet information given. Work toward liberal fluid intake-6-8 glasses water daily.    You will not need another colonoscopy as, these tests usually stop aroung 79years of age.

## 2013-02-13 NOTE — Op Note (Signed)
[facility] [address] [place], [address]   COLONOSCOPY PROCEDURE REPORT  PATIENT: Whitney, Donaldson  MR#: [id number] BIRTHDATE: February 05, 1933 , 80  yrs. old GENDER: Female ENDOSCOPIST: Meryl Dare, MD, [facility] PROCEDURE DATE:  02/13/2013 PROCEDURE:   Colonoscopy with snare polypectomy First Screening Colonoscopy - Avg.  risk and is 50 yrs.  old or older - No.  Prior Negative Screening - Now for repeat screening. 10 or more years since last screening  History of Adenoma - Now for follow-up colonoscopy & has been > or = to 3 yrs.  N/A  Polyps Removed Today? Yes. ASA CLASS:   Class II INDICATIONS:average risk screening. MEDICATIONS: MAC sedation, administered by CRNA and propofol (Diprivan) 150mg  IV DESCRIPTION OF PROCEDURE:   After the risks benefits and alternatives of the procedure were thoroughly explained, informed consent was obtained.  A digital rectal exam revealed no abnormalities of the rectum.   The LB [id number]  endoscope was introduced through the anus and advanced to the cecum, which was identified by both the appendix and ileocecal valve. No adverse events experienced.   The quality of the prep was good, using MoviPrep  The instrument was then slowly withdrawn as the colon was fully examined.  COLON FINDINGS: A sessile polyp measuring 6 mm in size was found in the ascending colon.  A polypectomy was performed with a cold snare.  The resection was complete and the polyp tissue was completely retrieved.   Mild diverticulosis was noted in the sigmoid colon. The colon was otherwise normal. There was no diverticulosis, inflammation, polyps or cancers unless previously stated.  Retroflexed views revealed no abnormalities. The time to cecum=4 minutes 30 seconds.  Withdrawal time=9 minutes 01 seconds. The scope was withdrawn and the procedure completed. COMPLICATIONS: There were no complications.  ENDOSCOPIC IMPRESSION: 1.   Sessile polyp  measuring 6 mm in the ascending colon; polypectomy performed with a cold snare 2.   Mild diverticulosis was noted in the sigmoid colon  RECOMMENDATIONS: 1.  Await pathology results 2.  High fiber diet with liberal fluid intake. 3.  Given your age, you will not need another colonoscopy for colon cancer screening or polyp surveillance.  These types of tests usually stop around the age 3.  eSigned:  Meryl Dare, MD, [facility] 02/13/2013 9:28 AM

## 2013-02-13 NOTE — Progress Notes (Signed)
Patient did not experience any of the following events: a burn prior to discharge; a fall within the facility; wrong site/side/patient/procedure/implant event; or a hospital transfer or hospital admission upon discharge from the facility. (G8907) Patient did not have preoperative order for IV antibiotic SSI prophylaxis. (G8918) Patient did not have preoperative order for IV antibiotic SSI prophylaxis. (G8918)  

## 2013-02-13 NOTE — Progress Notes (Signed)
Called to room to assist during endoscopic procedure.  Patient ID and intended procedure confirmed with present staff. Received instructions for my participation in the procedure from the performing physician.  

## 2013-02-14 ENCOUNTER — Telehealth: Payer: Self-pay | Admitting: *Deleted

## 2013-02-14 NOTE — Telephone Encounter (Signed)
  Follow up Call-  Call back number 02/13/2013  Post procedure Call Back phone  # [phone]  Permission to leave phone message No     Patient questions:  Do you have a fever, pain , or abdominal swelling? no Pain Score  0 *  Have you tolerated food without any problems? yes  Have you been able to return to your normal activities? yes  Do you have any questions about your discharge instructions: Diet   no Medications  no Follow up visit  no  Do you have questions or concerns about your Care? no  Actions: * If pain score is 4 or above: No action needed, pain <4.  Pt. Stated that when she woke up she was sneezing, but we did a wonderful job, she was on her way to work.

## 2013-02-15 ENCOUNTER — Other Ambulatory Visit: Payer: Self-pay | Admitting: Internal Medicine

## 2013-02-15 ENCOUNTER — Ambulatory Visit (INDEPENDENT_AMBULATORY_CARE_PROVIDER_SITE_OTHER)
Admission: RE | Admit: 2013-02-15 | Discharge: 2013-02-15 | Disposition: A | Discharge status: Home / Self Care | Payer: Medicare Other | Source: Ambulatory Visit | Attending: Internal Medicine | Admitting: Internal Medicine

## 2013-02-15 DIAGNOSIS — M546 Pain in thoracic spine: Secondary | ICD-10-CM

## 2013-02-15 DIAGNOSIS — R937 Abnormal findings on diagnostic imaging of other parts of musculoskeletal system: Secondary | ICD-10-CM

## 2013-02-20 ENCOUNTER — Encounter: Payer: Self-pay | Admitting: Gastroenterology

## 2013-05-03 ENCOUNTER — Other Ambulatory Visit: Payer: Self-pay

## 2013-06-11 ENCOUNTER — Other Ambulatory Visit: Payer: Self-pay | Admitting: Internal Medicine

## 2013-06-12 NOTE — Telephone Encounter (Signed)
Ok X1 

## 2013-06-12 NOTE — Telephone Encounter (Signed)
Lorazepam refill Last OV 02-01-13 Med filled 08-22-12 (no qty or refill listed) both Hopper and Ennever's names on Rx.   Low Risk

## 2013-07-10 ENCOUNTER — Encounter: Payer: Self-pay | Admitting: Internal Medicine

## 2013-08-22 ENCOUNTER — Other Ambulatory Visit: Payer: Self-pay | Admitting: Internal Medicine

## 2013-08-22 NOTE — Telephone Encounter (Signed)
Rx sent to the pharmacy by e-script.  Pt needs complete physical and fasting labs.//AB/CMA 

## 2013-11-29 ENCOUNTER — Ambulatory Visit (INDEPENDENT_AMBULATORY_CARE_PROVIDER_SITE_OTHER): Payer: Medicare HMO | Admitting: Internal Medicine

## 2013-11-29 ENCOUNTER — Encounter: Payer: Self-pay | Admitting: Internal Medicine

## 2013-11-29 ENCOUNTER — Other Ambulatory Visit (INDEPENDENT_AMBULATORY_CARE_PROVIDER_SITE_OTHER): Payer: Medicare HMO

## 2013-11-29 VITALS — BP 150/72 | HR 80 | Temp 98.2°F | Ht 63.0 in | Wt 109.6 lb

## 2013-11-29 DIAGNOSIS — Z853 Personal history of malignant neoplasm of breast: Secondary | ICD-10-CM

## 2013-11-29 DIAGNOSIS — D126 Benign neoplasm of colon, unspecified: Secondary | ICD-10-CM

## 2013-11-29 DIAGNOSIS — M949 Disorder of cartilage, unspecified: Secondary | ICD-10-CM

## 2013-11-29 DIAGNOSIS — R7309 Other abnormal glucose: Secondary | ICD-10-CM

## 2013-11-29 DIAGNOSIS — E785 Hyperlipidemia, unspecified: Secondary | ICD-10-CM

## 2013-11-29 DIAGNOSIS — M899 Disorder of bone, unspecified: Secondary | ICD-10-CM

## 2013-11-29 LAB — HEMOGLOBIN A1C: Hgb A1c MFr Bld: 6.2 % (ref 4.6–6.5)

## 2013-11-29 LAB — LIPID PANEL
CHOLESTEROL: 203 mg/dL — AB (ref 0–200)
HDL: 79.9 mg/dL (ref >39.00)
LDL Cholesterol: 112 mg/dL — ABNORMAL HIGH (ref 0–99)
NonHDL: 123.1
TRIGLYCERIDES: 58 mg/dL (ref 0.0–149.0)
Total CHOL/HDL Ratio: 3
VLDL: 11.6 mg/dL (ref 0.0–40.0)

## 2013-11-29 LAB — CBC WITH DIFFERENTIAL/PLATELET
BASOS ABS: 0 10*3/uL (ref 0.0–0.1)
Basophils Relative: 0.5 % (ref 0.0–3.0)
Eosinophils Absolute: 0 10*3/uL (ref 0.0–0.7)
Eosinophils Relative: 0 % (ref 0.0–5.0)
HCT: 44.9 % (ref 36.0–46.0)
HEMOGLOBIN: 15.1 g/dL — AB (ref 12.0–15.0)
LYMPHS ABS: 1.3 10*3/uL (ref 0.7–4.0)
Lymphocytes Relative: 24.7 % (ref 12.0–46.0)
MCHC: 33.7 g/dL (ref 30.0–36.0)
MCV: 93.4 fl (ref 78.0–100.0)
Monocytes Absolute: 0.5 10*3/uL (ref 0.1–1.0)
Monocytes Relative: 9.9 % (ref 3.0–12.0)
NEUTROS ABS: 3.3 10*3/uL (ref 1.4–7.7)
Neutrophils Relative %: 64.9 % (ref 43.0–77.0)
Platelets: 175 10*3/uL (ref 150.0–400.0)
RBC: 4.81 Mil/uL (ref 3.87–5.11)
RDW: 12.3 % (ref 11.5–15.5)
WBC: 5.1 10*3/uL (ref 4.0–10.5)

## 2013-11-29 LAB — BASIC METABOLIC PANEL
BUN: 17 mg/dL (ref 6–23)
CALCIUM: 10.4 mg/dL (ref 8.4–10.5)
CO2: 31 meq/L (ref 19–32)
Chloride: 103 mEq/L (ref 96–112)
Creatinine, Ser: 0.6 mg/dL (ref 0.4–1.2)
GFR: 95.06 mL/min (ref >60.00)
Glucose, Bld: 99 mg/dL (ref 70–99)
Potassium: 4.5 mEq/L (ref 3.5–5.1)
SODIUM: 140 meq/L (ref 135–145)

## 2013-11-29 LAB — HEPATIC FUNCTION PANEL
ALT: 26 U/L (ref 0–35)
AST: 28 U/L (ref 0–37)
Albumin: 4.8 g/dL (ref 3.5–5.2)
Alkaline Phosphatase: 55 U/L (ref 39–117)
Bilirubin, Direct: 0.1 mg/dL (ref 0.0–0.3)
TOTAL PROTEIN: 7.7 g/dL (ref 6.0–8.3)
Total Bilirubin: 0.6 mg/dL (ref 0.2–1.2)

## 2013-11-29 LAB — TSH: TSH: 0.91 u[IU]/mL (ref 0.35–4.50)

## 2013-11-29 MED ORDER — LORAZEPAM 0.5 MG PO TABS: ORAL_TABLET | ORAL | Status: DC

## 2013-11-29 NOTE — Assessment & Plan Note (Signed)
Continue annual monitor with Dr Marin Olp

## 2013-11-29 NOTE — Progress Notes (Signed)
Subjective:    Patient ID: Whitney Donaldson, female    DOB: [birth year], 80 y.o.   MRN: [id number]  HPI She is here to assess active health issues & conditions. PMH, FH, & Social history verified & updated   A heart healthy diet is followed; exercise encompasses  15 minutes 2  times per week as walking without symptoms.  Family history is positive for premature coronary disease. Advanced cholesterol testing reveals  LDL goal is less than 115 ; ideally < 85 . There is medication compliance with the statin.  Low dose ASA taken   Review of Systems Specifically denied are  chest pain, palpitations, dyspnea, or claudication.  Significant abdominal symptoms, memory deficit, or myalgias not present.except some legs cramps @ night intermittently. Unexplained weight loss of 6 # in past year.No abdominal pain, significant dyspepsia, dysphagia, melena, rectal bleeding, or persistently small caliber stools are denied.     Objective:   Physical Exam   Significant or distinguishing  findings on physical exam are documented first.  Below that are other systems examined & findings. She appears adequately nourished but thin She has slightly decreased range of motion of the cervical spine The left lobe of the thyroid small. The right is firm without nodularity First heart sound is increased. Aorta is palpable but there is no aortic aneurysm Isolated PIP fusiform digit changes.   Gen.: Thin but healthy and well-nourished in appearance. Alert, appropriate and cooperative throughout exam. Appears younger than stated age  Head: Normocephalic without obvious abnormalities Eyes: No corneal or conjunctival inflammation noted. Pupils equal round reactive to light and accommodation. Extraocular motion intact.  Ears: External  ear exam reveals no significant lesions or deformities. Canals clear .TMs normal. Hearing is grossly normal bilaterally. Nose: External nasal exam reveals no deformity or  inflammation. Nasal mucosa are pink and moist. No lesions or exudates noted.   Mouth: Oral mucosa and oropharynx reveal no lesions or exudates. Teeth in good repair. Neck: No deformities, masses, or tenderness noted.  Lungs: Normal respiratory effort; chest expands symmetrically. Lungs are clear to auscultation without rales, wheezes, or increased work of breathing. Heart: Normal rate and rhythm. Normal  S2. No gallop, click, or rub.  No murmur. Abdomen: Bowel sounds normal; abdomen soft and nontender. No masses, organomegaly or hernias noted. Genitalia: as per Gyn                                  Musculoskeletal/extremities: No deformity or scoliosis noted of  the thoracic or lumbar spine.  No clubbing, cyanosis, edema, or significant extremity  deformity noted. Range of motion normal .Tone & strength normal. Fingernail  health good. Able to lie down & sit up w/o help. Negative SLR bilaterally Vascular: Carotid, radial artery, dorsalis pedis and  posterior tibial pulses are full and equal. No bruits present. Neurologic: Alert and oriented x3. Deep tendon reflexes symmetrical and normal.  Gait normal      Skin: Intact without suspicious lesions or rashes. Lymph: No cervical, axillary lymphadenopathy present. Psych: Mood and affect are normal. Normally interactive  Assessment & Plan:  See Current Assessment & Plan in Problem List under specific Diagnosis

## 2013-11-29 NOTE — Patient Instructions (Signed)
Your next office appointment will be determined based upon review of your pending labs . Those instructions will be transmitted to you through My Chart  OR  by mail;whichever process is your choice to receive results & recommendations . Unfortunately we cannot send results through My Chart and mail results as well.Mailing requires staff involvement in copying & mailing data.With My Chart there is direct physician to patient communication in "real time". With My Chart you should  receive results very quickly, usually in less than 24 hours if you sign up as soon as possible after your visit.This is especially important with acute heath problems associated with significant lab abnormalities. Additionally using this system allows you access to your chart essentially anywhere in the world where there is Internet access. If you will not be using My Chart; that portal will be closed to prevent lab results not being addressed in a timely fashion by BOTH OF [place] , potentially resulting in delay in care.

## 2013-11-29 NOTE — Assessment & Plan Note (Signed)
Vitamin D level 

## 2013-11-29 NOTE — Progress Notes (Signed)
Pre visit review using our clinic review tool, if applicable. No additional management support is needed unless otherwise documented below in the visit note. 

## 2013-11-30 NOTE — Assessment & Plan Note (Signed)
A1c

## 2013-11-30 NOTE — Assessment & Plan Note (Signed)
CBC & dif 

## 2013-11-30 NOTE — Assessment & Plan Note (Signed)
fasting Labs : BMET,Lipids, hepatic panel,TSH 

## 2013-12-02 LAB — VITAMIN D 1,25 DIHYDROXY
VITAMIN D 1, 25 (OH) TOTAL: 64 pg/mL (ref 18–72)
VITAMIN D3 1, 25 (OH): 64 pg/mL
Vitamin D2 1, 25 (OH)2: <8 pg/mL

## 2013-12-03 ENCOUNTER — Telehealth: Payer: Self-pay

## 2013-12-03 NOTE — Telephone Encounter (Signed)
Left message for patient to return call.

## 2013-12-03 NOTE — Telephone Encounter (Signed)
mychart is being used

## 2013-12-03 NOTE — Telephone Encounter (Signed)
Message copied by Shelly Coss on Mon Dec 03, 2013  8:37 AM ------      Message from: Hendricks Limes      Created: Sat Dec 01, 2013  9:47 AM       Please verify that results are being checked through My Chart. If  not using My Chart; results will be mailed & My Chart should be closed. ------

## 2013-12-18 ENCOUNTER — Other Ambulatory Visit: Payer: Medicare Other | Admitting: Lab

## 2013-12-18 ENCOUNTER — Ambulatory Visit: Payer: Medicare Other | Admitting: Hematology & Oncology

## 2013-12-19 ENCOUNTER — Other Ambulatory Visit: Payer: Self-pay | Admitting: *Deleted

## 2013-12-19 ENCOUNTER — Other Ambulatory Visit (HOSPITAL_BASED_OUTPATIENT_CLINIC_OR_DEPARTMENT_OTHER): Payer: Medicare HMO | Admitting: Lab

## 2013-12-19 ENCOUNTER — Ambulatory Visit (HOSPITAL_BASED_OUTPATIENT_CLINIC_OR_DEPARTMENT_OTHER): Payer: Medicare HMO | Admitting: Hematology & Oncology

## 2013-12-19 VITALS — BP 136/53 | HR 78 | Resp 20 | Ht 65.5 in | Wt 111.0 lb

## 2013-12-19 DIAGNOSIS — Z85828 Personal history of other malignant neoplasm of skin: Secondary | ICD-10-CM

## 2013-12-19 DIAGNOSIS — Z853 Personal history of malignant neoplasm of breast: Secondary | ICD-10-CM

## 2013-12-19 DIAGNOSIS — M949 Disorder of cartilage, unspecified: Principal | ICD-10-CM

## 2013-12-19 DIAGNOSIS — M899 Disorder of bone, unspecified: Secondary | ICD-10-CM

## 2013-12-19 LAB — COMPREHENSIVE METABOLIC PANEL
ALBUMIN: 4.6 g/dL (ref 3.5–5.2)
ALK PHOS: 44 U/L (ref 39–117)
ALT: 19 U/L (ref 0–35)
AST: 20 U/L (ref 0–37)
BILIRUBIN TOTAL: 0.5 mg/dL (ref 0.2–1.2)
BUN: 18 mg/dL (ref 6–23)
CO2: 28 mEq/L (ref 19–32)
Calcium: 9.7 mg/dL (ref 8.4–10.5)
Chloride: 102 mEq/L (ref 96–112)
Creatinine, Ser: 0.73 mg/dL (ref 0.50–1.10)
GLUCOSE: 80 mg/dL (ref 70–99)
POTASSIUM: 4.3 meq/L (ref 3.5–5.3)
SODIUM: 141 meq/L (ref 135–145)
TOTAL PROTEIN: 6.7 g/dL (ref 6.0–8.3)

## 2013-12-19 LAB — CBC WITH DIFFERENTIAL (CANCER CENTER ONLY)
BASO#: 0 10*3/uL (ref 0.0–0.2)
BASO%: 0.5 % (ref 0.0–2.0)
EOS ABS: 0 10*3/uL (ref 0.0–0.5)
EOS%: 0 % (ref 0.0–7.0)
HCT: 41.5 % (ref 34.8–46.6)
HGB: 13.9 g/dL (ref 11.6–15.9)
LYMPH#: 1 10*3/uL (ref 0.9–3.3)
LYMPH%: 23 % (ref 14.0–48.0)
MCH: 32 pg (ref 26.0–34.0)
MCHC: 33.5 g/dL (ref 32.0–36.0)
MCV: 95 fL (ref 81–101)
MONO#: 0.5 10*3/uL (ref 0.1–0.9)
MONO%: 11.5 % (ref 0.0–13.0)
NEUT%: 65 % (ref 39.6–80.0)
NEUTROS ABS: 2.9 10*3/uL (ref 1.5–6.5)
PLATELETS: 154 10*3/uL (ref 145–400)
RBC: 4.35 10*6/uL (ref 3.70–5.32)
RDW: 11.9 % (ref 11.1–15.7)
WBC: 4.4 10*3/uL (ref 3.9–10.0)

## 2013-12-19 NOTE — Progress Notes (Signed)
Hematology and Oncology Follow Up Visit  Whitney Donaldson [id number] 1933/02/09 80 y.o. 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  Current Therapy:    Observation     Interim History:  [name] is back for followup. We see her yearly. She's been doing well. He is going to retire next week. She's in for this.  Has been no change in her medications as we last saw her. She is on Crestor. She is also on aspirin. She is taking vitamin D.  She now is about 13 years for her diagnoses.  She has been get her mammograms. Her last mammogram was back in October of 2014. This all with fine. There's been no cough. She's had no shortness of breath. She's had no bony pain. There's been no leg swelling.  Medications: Current outpatient prescriptions:aspirin 81 MG tablet, Take 81 mg by mouth daily.  , Disp: , Rfl: ;  Calcium Carbonate-Vitamin D (CALCIUM + D PO), Take by mouth daily. , Disp: , Rfl: ;  Cholecalciferol (VITAMIN D3) 1000 UNITS CAPS, Take by mouth 2 (two) times daily. , Disp: , Rfl: ;  cyanocobalamin 500 MCG tablet, Take 500 mcg by mouth daily. Take 2 pills daily, Disp: , Rfl:  DHA-EPA-Coenzyme Q10-Vitamin E (COQ-10 & FISH OIL PO), Take by mouth.  , Disp: , Rfl: ;  FOLIC ACID PO, Take 956 mg by mouth daily. , Disp: , Rfl: ;  Multiple Vitamin (MULTIVITAMIN) tablet, Take 1 tablet by mouth daily.  , Disp: , Rfl: ;  rosuvastatin (CRESTOR) 20 MG tablet, PT NEEDS COMPLETE PHYSICAL AND FASTING LABS.  TAKE 1 TABLET BY MOUTH AT BEDTIME., Disp: 90 tablet, Rfl: 0 vitamin C (ASCORBIC ACID) 250 MG tablet, Take 500 mg by mouth daily. , Disp: , Rfl: ;  Vitamin E 400 UNITS TABS, Take by mouth daily., Disp: , Rfl: ;  LORazepam (ATIVAN) 0.5 MG tablet, TAKE 1 TABLET BY MOUTH EVERY 8 HOURS AS NEEDED, Disp: 30 tablet, Rfl: 0  Allergies:  Allergies  Allergen Reactions  . Oxycodone-Acetaminophen     REACTION: nausea    Past Medical History, Surgical history,  Social history, and Family History were reviewed and updated.  Review of Systems: As above  Physical Exam:  height is 5' 5.5" (1.664 m) and weight is 111 lb (50.349 kg). Her blood pressure is 136/53 and her pulse is 78. Her respiration is 20.   Petite white female. Her head and neck exam shows no ocular or oral lesions. There are no palpable cervical or supraclavicular lymph nodes. Lungs are clear bilateral. Cardiac exam regular in rhythm with no murmurs rubs or bruits. Abdomen is soft. She has good bowel sounds. There is no fluid wave. There is no palpable liver or spleen tip. This exam right breast with no masses or edema or erythema. There is no right axillary adenopathy. Left breast shows well-healed lumpectomy. The lumpectomy is at the 2:00 position. No masses noted in the left breast. There is no left axillary adenopathy. Neck exam shows no kyphosis. There is no tenderness over the spine ribs or hips. Extremities shows no clubbing cyanosis or edema. Neurological exam shows no focal neurological deficits. Skin exam no rashes.  Lab Results  Component Value Date   WBC 4.4 12/19/2013   HGB 13.9 12/19/2013   HCT 41.5 12/19/2013   MCV 95 12/19/2013   PLT 154 12/19/2013     Chemistry  Component Value Date/Time   NA 140 11/29/2013 1138   K 4.5 11/29/2013 1138   CL 103 11/29/2013 1138   CO2 31 11/29/2013 1138   BUN 17 11/29/2013 1138   CREATININE 0.6 11/29/2013 1138      Component Value Date/Time   CALCIUM 10.4 11/29/2013 1138   ALKPHOS 55 11/29/2013 1138   AST 28 11/29/2013 1138   ALT 26 11/29/2013 1138   BILITOT 0.6 11/29/2013 1138         Impression and Plan: Whitney Donaldson is a 80 year old white female with end-stage 1 ductal carcinoma the left breast. She's doing well. She is 13 years out now. Sclerae see no evidence of recurrent disease.  We will plan to go back in one more year.   Volanda Napoleon, MD 6/24/20159:56 AM

## 2013-12-31 ENCOUNTER — Encounter: Payer: Self-pay | Admitting: Nurse Practitioner

## 2014-01-01 ENCOUNTER — Other Ambulatory Visit: Payer: Self-pay | Admitting: Certified Registered Nurse Anesthetist

## 2014-01-01 MED ORDER — ROSUVASTATIN CALCIUM 20 MG PO TABS: ORAL_TABLET | ORAL | Status: DC

## 2014-04-19 ENCOUNTER — Ambulatory Visit: Payer: Medicare Other

## 2014-04-26 ENCOUNTER — Ambulatory Visit: Payer: Medicare Other

## 2014-05-21 ENCOUNTER — Encounter: Payer: Self-pay | Admitting: Internal Medicine

## 2014-12-05 ENCOUNTER — Encounter: Payer: Self-pay | Admitting: Internal Medicine

## 2014-12-05 ENCOUNTER — Other Ambulatory Visit: Payer: Self-pay | Admitting: Internal Medicine

## 2014-12-05 ENCOUNTER — Other Ambulatory Visit (INDEPENDENT_AMBULATORY_CARE_PROVIDER_SITE_OTHER): Payer: Medicare HMO

## 2014-12-05 ENCOUNTER — Ambulatory Visit (INDEPENDENT_AMBULATORY_CARE_PROVIDER_SITE_OTHER): Payer: Medicare HMO | Admitting: Internal Medicine

## 2014-12-05 VITALS — BP 136/72 | HR 85 | Temp 97.5°F | Resp 16 | Ht 65.0 in | Wt 106.0 lb

## 2014-12-05 DIAGNOSIS — R351 Nocturia: Secondary | ICD-10-CM

## 2014-12-05 DIAGNOSIS — M8580 Other specified disorders of bone density and structure, unspecified site: Secondary | ICD-10-CM

## 2014-12-05 DIAGNOSIS — E785 Hyperlipidemia, unspecified: Secondary | ICD-10-CM | POA: Diagnosis not present

## 2014-12-05 DIAGNOSIS — D126 Benign neoplasm of colon, unspecified: Secondary | ICD-10-CM | POA: Diagnosis not present

## 2014-12-05 DIAGNOSIS — R0789 Other chest pain: Secondary | ICD-10-CM

## 2014-12-05 DIAGNOSIS — M859 Disorder of bone density and structure, unspecified: Secondary | ICD-10-CM

## 2014-12-05 DIAGNOSIS — R9431 Abnormal electrocardiogram [ECG] [EKG]: Secondary | ICD-10-CM

## 2014-12-05 DIAGNOSIS — Z23 Encounter for immunization: Secondary | ICD-10-CM | POA: Diagnosis not present

## 2014-12-05 LAB — CBC WITH DIFFERENTIAL/PLATELET
BASOS ABS: 0 10*3/uL (ref 0.0–0.1)
BASOS PCT: 0.6 % (ref 0.0–3.0)
EOS ABS: 0 10*3/uL (ref 0.0–0.7)
EOS PCT: 0 % (ref 0.0–5.0)
HCT: 42.5 % (ref 36.0–46.0)
Hemoglobin: 14.4 g/dL (ref 12.0–15.0)
LYMPHS ABS: 1.1 10*3/uL (ref 0.7–4.0)
Lymphocytes Relative: 26.1 % (ref 12.0–46.0)
MCHC: 33.8 g/dL (ref 30.0–36.0)
MCV: 93.2 fl (ref 78.0–100.0)
MONO ABS: 0.4 10*3/uL (ref 0.1–1.0)
Monocytes Relative: 10.6 % (ref 3.0–12.0)
NEUTROS ABS: 2.6 10*3/uL (ref 1.4–7.7)
Neutrophils Relative %: 62.7 % (ref 43.0–77.0)
Platelets: 165 10*3/uL (ref 150.0–400.0)
RBC: 4.56 Mil/uL (ref 3.87–5.11)
RDW: 12.4 % (ref 11.5–15.5)
WBC: 4.1 10*3/uL (ref 4.0–10.5)

## 2014-12-05 LAB — BASIC METABOLIC PANEL
BUN: 19 mg/dL (ref 6–23)
CALCIUM: 10 mg/dL (ref 8.4–10.5)
CO2: 32 mEq/L (ref 19–32)
CREATININE: 0.7 mg/dL (ref 0.40–1.20)
Chloride: 102 mEq/L (ref 96–112)
GFR: 85.5 mL/min (ref >60.00)
Glucose, Bld: 88 mg/dL (ref 70–99)
Potassium: 4.1 mEq/L (ref 3.5–5.1)
SODIUM: 138 meq/L (ref 135–145)

## 2014-12-05 LAB — URINALYSIS
BILIRUBIN URINE: NEGATIVE
Hgb urine dipstick: NEGATIVE
Ketones, ur: NEGATIVE
Leukocytes, UA: NEGATIVE
NITRITE: NEGATIVE
PH: 6 (ref 5.0–8.0)
Specific Gravity, Urine: 1.02 (ref 1.000–1.030)
TOTAL PROTEIN, URINE-UPE24: NEGATIVE
Urine Glucose: NEGATIVE
Urobilinogen, UA: 0.2 (ref 0.0–1.0)

## 2014-12-05 LAB — HEPATIC FUNCTION PANEL
ALK PHOS: 53 U/L (ref 39–117)
ALT: 21 U/L (ref 0–35)
AST: 21 U/L (ref 0–37)
Albumin: 4.6 g/dL (ref 3.5–5.2)
Bilirubin, Direct: 0.1 mg/dL (ref 0.0–0.3)
Total Bilirubin: 0.6 mg/dL (ref 0.2–1.2)
Total Protein: 7.2 g/dL (ref 6.0–8.3)

## 2014-12-05 LAB — VITAMIN D 25 HYDROXY (VIT D DEFICIENCY, FRACTURES): VITD: 64.96 ng/mL (ref 30.00–100.00)

## 2014-12-05 LAB — TSH: TSH: 1.06 u[IU]/mL (ref 0.35–4.50)

## 2014-12-05 NOTE — Progress Notes (Signed)
Pre visit review using our clinic review tool, if applicable. No additional management support is needed unless otherwise documented below in the visit note. 

## 2014-12-05 NOTE — Progress Notes (Signed)
   Subjective:    Patient ID: Whitney Donaldson, female    DOB: 11/29/1932, 81 y.o.   MRN: [id number]  HPI She is here to assess active health issues & conditions. PMH, FH, & Social history verified & updated   She is on a heart healthy diet; she will eat some fried foods. She walks 2-3 times per week for 30 minutes without cardio pulmonary symptoms. She does have chest tightness with stress which resolves without treatment.  Her major concern is nocturia at least 2-3 times per night. She has no other genitourinary symptoms  The right index finger will "lock" under the third digit intermittently. 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 She declined treatment at that time. She denies any active joint pain.   Colonoscopy was performed 02/13/13; sessile polyp was documented. She has no active GI symptoms.  Review of Systems  Palpitations, tachycardia, exertional dyspnea, paroxysmal nocturnal dyspnea, claudication or edema are absent.  No unexplained weight loss, abdominal pain, significant dyspepsia, dysphagia, melena, rectal bleeding, or persistently small caliber stools.  Dysuria, pyuria, hematuria, frequency, or polyuria are denied.  Change in hair, skin, nails denied. No bowel changes of constipation or diarrhea. No intolerance to heat or cold.     Objective:   Physical Exam Pertinent or positive findings include: She is thin but adequately nourished. There is decreased range of motion cervical spine. Aorta is palpable without aneurysm. She has PIP fusiform changes greater in the right hand than the left. She has crepitus in the knees.  Eyes: No conjunctival inflammation or scleral icterus is present.  Oral exam:  Lips and gums are healthy appearing.There is no oropharyngeal erythema or exudate noted. Dental hygiene is good.  Heart:  Normal rate and regular rhythm. S1 and S2 normal without gallop, murmur, click, rub or other extra sounds     Lungs:Chest clear to auscultation; no wheezes, rhonchi,rales ,or rubs present.No increased work of breathing.   Abdomen: bowel sounds normal, soft and non-tender without masses, organomegaly or hernias noted.  No guarding or rebound.   Vascular : all pulses equal ; no bruits present.  Skin:Warm & dry.  Intact without suspicious lesions or rashes ; no tenting or jaundice   Lymphatic: No lymphadenopathy is noted about the head, neck, axilla  Neuro: Strength, tone & DTRs normal.          Assessment & Plan:  #1 See Current Assessment & Plan in Problem List under specific Diagnosis #2 chest pain with stress. The EKG was compared to 02/01/13. The only change is the T wave was inverted in aVL; it is now flat. There is a Q-wave in V1 and V2 which is unchanged. There are no associated ischemic ST-T changes. This was interpreted as normal 2014;the new computer system questioned an old anterior infarct. This is incorrect. #3 trigger finger #4 nocturia See orders & AVS

## 2014-12-05 NOTE — Patient Instructions (Signed)
  Your next office appointment will be determined based upon review of your pending labs . Those written interpretation of the lab results and instructions will be transmitted to you by mail for your records.  Critical results will be called.  Followup as needed for any active or acute issue. Please report any significant change in your symptoms.  Colonoscopy due 2019

## 2014-12-07 LAB — NMR LIPOPROFILE WITH LIPIDS
Cholesterol, Total: 201 mg/dL — ABNORMAL HIGH (ref 100–199)
HDL Particle Number: 39.7 umol/L (ref >=30.5)
HDL SIZE: 10 nm (ref >=9.2)
HDL-C: 81 mg/dL (ref >39)
LARGE HDL: 16.2 umol/L (ref >=4.8)
LDL CALC: 104 mg/dL — AB (ref 0–99)
LDL Particle Number: 1243 nmol/L — ABNORMAL HIGH (ref <1000)
LDL SIZE: 21 nm (ref >=20.8)
LP-IR Score: 25 (ref <=45)
Large VLDL-P: 2.3 nmol/L (ref <=2.7)
Small LDL Particle Number: <90 nmol/L (ref <=527)
Triglycerides: 79 mg/dL (ref 0–149)
VLDL Size: 49.7 nm — ABNORMAL HIGH (ref <=46.6)

## 2014-12-18 ENCOUNTER — Other Ambulatory Visit (HOSPITAL_BASED_OUTPATIENT_CLINIC_OR_DEPARTMENT_OTHER): Payer: Medicare HMO

## 2014-12-18 ENCOUNTER — Ambulatory Visit (HOSPITAL_BASED_OUTPATIENT_CLINIC_OR_DEPARTMENT_OTHER): Payer: Medicare HMO | Admitting: Hematology & Oncology

## 2014-12-18 ENCOUNTER — Encounter: Payer: Self-pay | Admitting: Hematology & Oncology

## 2014-12-18 VITALS — BP 129/50 | HR 79 | Temp 97.8°F | Resp 14 | Ht 65.0 in | Wt 106.0 lb

## 2014-12-18 DIAGNOSIS — Z853 Personal history of malignant neoplasm of breast: Secondary | ICD-10-CM | POA: Diagnosis not present

## 2014-12-18 DIAGNOSIS — M949 Disorder of cartilage, unspecified: Principal | ICD-10-CM

## 2014-12-18 DIAGNOSIS — M899 Disorder of bone, unspecified: Secondary | ICD-10-CM

## 2014-12-18 DIAGNOSIS — M8580 Other specified disorders of bone density and structure, unspecified site: Secondary | ICD-10-CM

## 2014-12-18 LAB — CBC WITH DIFFERENTIAL (CANCER CENTER ONLY)
BASO#: 0 10*3/uL (ref 0.0–0.2)
BASO%: 0.4 % (ref 0.0–2.0)
EOS%: 0 % (ref 0.0–7.0)
Eosinophils Absolute: 0 10*3/uL (ref 0.0–0.5)
HCT: 41.2 % (ref 34.8–46.6)
HGB: 13.8 g/dL (ref 11.6–15.9)
LYMPH#: 0.9 10*3/uL (ref 0.9–3.3)
LYMPH%: 18.8 % (ref 14.0–48.0)
MCH: 32.3 pg (ref 26.0–34.0)
MCHC: 33.5 g/dL (ref 32.0–36.0)
MCV: 97 fL (ref 81–101)
MONO#: 0.5 10*3/uL (ref 0.1–0.9)
MONO%: 11.3 % (ref 0.0–13.0)
NEUT%: 69.5 % (ref 39.6–80.0)
NEUTROS ABS: 3.3 10*3/uL (ref 1.5–6.5)
PLATELETS: 145 10*3/uL (ref 145–400)
RBC: 4.27 10*6/uL (ref 3.70–5.32)
RDW: 11.9 % (ref 11.1–15.7)
WBC: 4.8 10*3/uL (ref 3.9–10.0)

## 2014-12-18 LAB — COMPREHENSIVE METABOLIC PANEL
ALT: 23 U/L (ref 0–35)
AST: 23 U/L (ref 0–37)
Albumin: 4.4 g/dL (ref 3.5–5.2)
Alkaline Phosphatase: 47 U/L (ref 39–117)
BUN: 21 mg/dL (ref 6–23)
CHLORIDE: 104 meq/L (ref 96–112)
CO2: 29 mEq/L (ref 19–32)
CREATININE: 0.69 mg/dL (ref 0.50–1.10)
Calcium: 9.6 mg/dL (ref 8.4–10.5)
Glucose, Bld: 87 mg/dL (ref 70–99)
Potassium: 4.3 mEq/L (ref 3.5–5.3)
Sodium: 141 mEq/L (ref 135–145)
Total Bilirubin: 0.5 mg/dL (ref 0.2–1.2)
Total Protein: 7.1 g/dL (ref 6.0–8.3)

## 2014-12-18 NOTE — Progress Notes (Signed)
Hematology and Oncology Follow Up Visit  Whitney Donaldson [id number] July 11, 1932 81 y.o. 12/18/2014   Principle Diagnosis:  Stage I (T1 N0 M0) ductal carcinoma of the left breast.  Current Therapy:    Observation     Interim History:  Ms.  Whitney Donaldson is back for followup. We see her yearly. She's been doing well.   Her main complaint has been nocturia. She says she gets up 3 or 4 times a night. She has some labwork done by Dr. Linna Donaldson, her family doctor. A urinalysis was done which looked unremarkable.  She's had no fever. She's had no bleeding. She's had no rashes.  Has not been any weight loss or weight gain. She's had no medications added. She is taking aspirin and vitamin D.  Medications:  Current outpatient prescriptions:  .  aspirin 81 MG tablet, Take 81 mg by mouth daily.  , Disp: , Rfl:  .  Calcium Carbonate-Vitamin D (CALCIUM + D PO), Take by mouth daily. , Disp: , Rfl:  .  Cholecalciferol (VITAMIN D3) 1000 UNITS CAPS, Take by mouth 2 (two) times daily. , Disp: , Rfl:  .  cyanocobalamin 500 MCG tablet, Take 500 mcg by mouth daily. Take 2 pills daily, Disp: , Rfl:  .  DHA-EPA-Coenzyme Q10-Vitamin E (COQ-10 & FISH OIL PO), Take by mouth.  , Disp: , Rfl:  .  FOLIC ACID PO, Take 626 mg by mouth daily. , Disp: , Rfl:  .  LORazepam (ATIVAN) 0.5 MG tablet, TAKE 1 TABLET BY MOUTH EVERY 8 HOURS AS NEEDED, Disp: 30 tablet, Rfl: 0 .  Multiple Vitamin (MULTIVITAMIN) tablet, Take 1 tablet by mouth daily.  , Disp: , Rfl:  .  rosuvastatin (CRESTOR) 20 MG tablet, Take 1 tablet by mouth at bedtime, Disp: 90 tablet, Rfl: 3 .  vitamin C (ASCORBIC ACID) 250 MG tablet, Take 500 mg by mouth daily. , Disp: , Rfl:  .  Vitamin E 400 UNITS TABS, Take by mouth daily., Disp: , Rfl:   Allergies:  Allergies  Allergen Reactions  . Oxycodone-Acetaminophen     REACTION: nausea    Past Medical History, Surgical history, Social history, and Family History were reviewed and updated.  Review of  Systems: As above  Physical Exam:  height is 5\' 5"  (1.651 m) and weight is 106 lb (48.081 kg). Her oral temperature is 97.8 F (36.6 C). Her blood pressure is 129/50 and her pulse is 79. Her respiration is 14.   Petite white female. Her head and neck exam shows no ocular or oral lesions. There are no palpable cervical or supraclavicular lymph nodes. Lungs are clear bilateral. Cardiac exam regular rate and rhythm with no murmurs rubs or bruits. Abdomen is soft. She has good bowel sounds. There is no fluid wave. There is no palpable liver or spleen tip. This exam right breast with no masses or edema or erythema. There is no right axillary adenopathy. Left breast shows well-healed lumpectomy. The lumpectomy is at the 2:00 position. No masses noted in the left breast. There is no left axillary adenopathy. Neck exam shows no kyphosis. There is no tenderness over the spine ribs or hips. Extremities shows no clubbing cyanosis or edema. Neurological exam shows no focal neurological deficits. Skin exam no rashes.  Lab Results  Component Value Date   WBC 4.8 12/18/2014   HGB 13.8 12/18/2014   HCT 41.2 12/18/2014   MCV 97 12/18/2014   PLT 145 12/18/2014     Chemistry  Component Value Date/Time   NA 138 12/05/2014 1037   K 4.1 12/05/2014 1037   CL 102 12/05/2014 1037   CO2 32 12/05/2014 1037   BUN 19 12/05/2014 1037   CREATININE 0.70 12/05/2014 1037      Component Value Date/Time   CALCIUM 10.0 12/05/2014 1037   ALKPHOS 53 12/05/2014 1037   AST 21 12/05/2014 1037   ALT 21 12/05/2014 1037   BILITOT 0.6 12/05/2014 1037         Impression and Plan: [name] is a 81 year old white female with end-stage 1 ductal carcinoma the left breast. She's doing well. She is 14 years out now. I see no evidence of recurrent disease.  I looked at the urine specimen that she had done by her family doctor. I do not see anything there that would cause the nocturia. I told her that she probably needs  to go back to see Dr. Linna Donaldson and he might [place] referral to urology.  We will plan to go back in one more year.   Whitney Napoleon, MD 6/22/20169:37 AM

## 2014-12-19 LAB — VITAMIN D 25 HYDROXY (VIT D DEFICIENCY, FRACTURES): VIT D 25 HYDROXY: 48 ng/mL (ref 30–100)

## 2014-12-23 ENCOUNTER — Telehealth: Payer: Self-pay | Admitting: *Deleted

## 2014-12-23 NOTE — Telephone Encounter (Addendum)
Patient aware of results ----- Message from Volanda Napoleon, MD sent at 12/22/2014  9:19 PM EDT ----- Call - VIt D level is ok!!! Laurey Arrow

## 2014-12-25 ENCOUNTER — Other Ambulatory Visit: Payer: Self-pay | Admitting: Emergency Medicine

## 2014-12-25 MED ORDER — ROSUVASTATIN CALCIUM 20 MG PO TABS: ORAL_TABLET | ORAL | Status: DC

## 2014-12-25 NOTE — Telephone Encounter (Signed)
RX faxed to pharm for Crestor per pharm.

## 2015-01-15 ENCOUNTER — Telehealth: Payer: Self-pay | Admitting: Emergency Medicine

## 2015-01-15 ENCOUNTER — Other Ambulatory Visit: Payer: Self-pay | Admitting: Internal Medicine

## 2015-01-15 ENCOUNTER — Encounter: Payer: Self-pay | Admitting: Internal Medicine

## 2015-01-15 DIAGNOSIS — R351 Nocturia: Secondary | ICD-10-CM

## 2015-01-15 NOTE — Telephone Encounter (Signed)
Urology referral ordered.

## 2015-01-15 NOTE — Telephone Encounter (Signed)
Pt is in with husband for an appt today. She was aware that it was not her appt and stated to send you and message and call her with a response. She is complaining about having to get up approx 5 times a night to go to the restroom. Is there something else she can take to stop this? Please advise.

## 2015-01-16 NOTE — Telephone Encounter (Signed)
Informed pt of urology referral

## 2015-02-04 ENCOUNTER — Telehealth: Payer: Self-pay

## 2015-02-04 NOTE — Telephone Encounter (Signed)
Call to the patient after seeing spouse for AWV; Due to company and vacation, would like to schedule 9/29 at 9:30

## 2015-03-27 ENCOUNTER — Ambulatory Visit (INDEPENDENT_AMBULATORY_CARE_PROVIDER_SITE_OTHER): Payer: Medicare HMO

## 2015-03-27 VITALS — BP 130/60 | Ht 65.0 in | Wt 109.5 lb

## 2015-03-27 DIAGNOSIS — Z23 Encounter for immunization: Secondary | ICD-10-CM | POA: Diagnosis not present

## 2015-03-27 DIAGNOSIS — Z0000 Encounter for general adult medical examination without abnormal findings: Secondary | ICD-10-CM

## 2015-03-27 NOTE — Patient Instructions (Addendum)
Whitney Donaldson , Thank you for taking time to come for your Medicare Wellness Visit. I appreciate your ongoing commitment to your health goals. Please review the following plan we discussed and let me know if I can assist you in the future.   Will have eye exam Information given on Glaucoma for education only, as this is an important screen   These are the goals we discussed: Goals    . patient     Reduce stress by getting out more       This is a list of the screening recommended for you and due dates:  Health Maintenance  Topic Date Due  . Flu Shot  01/27/2015  . Tetanus Vaccine  08/22/2022  . DEXA scan (bone density measurement)  Completed  . Shingles Vaccine  Completed  . Pneumonia vaccines  Completed     Fall Prevention and Home Safety Falls cause injuries and can affect all age groups. It is possible to prevent falls.  HOW TO PREVENT FALLS  Wear shoes with rubber soles that do not have an opening for your toes.  Keep the inside and outside of your house well lit.  Use night lights throughout your home.  Remove clutter from floors.  Clean up floor spills.  Remove throw rugs or fasten them to the floor with carpet tape.  Do not place electrical cords across pathways.  Put grab bars by your tub, shower, and toilet. Do not use towel bars as grab bars.  Put handrails on both sides of the stairway. Fix loose handrails.  Do not climb on stools or stepladders, if possible.  Do not wax your floors.  Repair uneven or unsafe sidewalks, walkways, or stairs.  Keep items you use a lot within reach.  Be aware of pets.  Keep emergency numbers next to the telephone.  Put smoke detectors in your home and near bedrooms. Ask your doctor what other things you can do to prevent falls. Document Released: 04/10/2009 Document Revised: 12/14/2011 Document Reviewed: 09/14/2011 [facility] Patient Information 2015 [place], [place]. This information is not intended to replace advice  given to you by your health care provider. Make sure you discuss any questions you have with your health care provider.  Glaucoma Glaucoma happens when the fluid pressure in the eyeball is too high. The pressure cannot stay high for too long, or the eyeball may become damaged. Signs of glaucoma include:  Having a hard time seeing in a dark room after being in a bright one.  Having trouble seeing things out to the sides of you.  Blurry sight.  Seeing bright white lights or colors in front of your eyes.  Headaches.  Feeling sick to your stomach (nauseous) or throwing up (vomiting).  Sudden vision loss. Glaucoma testing is an important part of taking care of your eyesight. HOME CARE  Always use your eyedrops or pills as told by your doctor. Do not run out.  Do not go away from home without your eyedrops or pills.  Keep your appointments.  Always tell a new doctor that you have glaucoma and how long you have had it. Tell the doctor about the eyedrops and pills you take.  Do not use eyedrops or pills that have not been prescribed by your doctor. GET HELP RIGHT AWAY IF:  You develop severe pain in the affected eye.  You develop vision problems.  You develop a bad headache in the area around the eye.  You feel sick to your stomach or  throw up.  You start to have problems with the other eye. MAKE SURE YOU:   Understand these instructions.  Will watch your condition.  Will get help right away if you are not doing well or get worse. Document Released: 03/23/2008 Document Revised: 09/06/2011 Document Reviewed: 03/23/2008 [facility] Patient Information 2015 [place], [place]. This information is not intended to replace advice given to you by your health care provider. Make sure you discuss any questions you have with your health care provider.  Health Maintenance Adopting a healthy lifestyle and getting preventive care can go a long way to promote health and wellness. Talk with  your health care provider about what schedule of regular examinations is right for you. This is a good chance for you to check in with your provider about disease prevention and staying healthy. In between checkups, there are plenty of things you can do on your own. Experts have done a lot of research about which lifestyle changes and preventive measures are most likely to keep you healthy. Ask your health care provider for more information. WEIGHT AND DIET  Eat a healthy diet  Be sure to include plenty of vegetables, fruits, low-fat dairy products, and lean protein.  Do not eat a lot of foods high in solid fats, added sugars, or salt.  Get regular exercise. This is one of the most important things you can do for your health.  Most adults should exercise for at least 150 minutes each week. The exercise should increase your heart rate and make you sweat (moderate-intensity exercise).  Most adults should also do strengthening exercises at least twice a week. This is in addition to the moderate-intensity exercise.  Maintain a healthy weight  Body mass index (BMI) is a measurement that can be used to identify possible weight problems. It estimates body fat based on height and weight. Your health care provider can help determine your BMI and help you achieve or maintain a healthy weight.  For females 26 years of age and older:   A BMI below 18.5 is considered underweight.  A BMI of 18.5 to 24.9 is normal.  A BMI of 25 to 29.9 is considered overweight.  A BMI of 30 and above is considered obese.  Watch levels of cholesterol and blood lipids  You should start having your blood tested for lipids and cholesterol at 79 years of age, then have this test every 5 years.  You may need to have your cholesterol levels checked more often if:  Your lipid or cholesterol levels are high.  You are older than 79 years of age.  You are at high risk for heart disease.  CANCER SCREENING   Lung  Cancer  Lung cancer screening is recommended for adults 20-80 years old who are at high risk for lung cancer because of a history of smoking.  A yearly low-dose CT scan of the lungs is recommended for people who:  Currently smoke.  Have quit within the past 15 years.  Have at least a 30-pack-year history of smoking. A pack year is smoking an average of one pack of cigarettes a day for 1 year.  Yearly screening should continue until it has been 15 years since you quit.  Yearly screening should stop if you develop a health problem that would prevent you from having lung cancer treatment.  Breast Cancer  Practice breast self-awareness. This means understanding how your breasts normally appear and feel.  It also means doing regular breast self-exams. Let your health  care provider know about any changes, no matter how small.  If you are in your 20s or 30s, you should have a clinical breast exam (CBE) by a health care provider every 1-3 years as part of a regular health exam.  If you are 22 or older, have a CBE every year. Also consider having a breast X-ray (mammogram) every year.  If you have a family history of breast cancer, talk to your health care provider about genetic screening.  If you are at high risk for breast cancer, talk to your health care provider about having an MRI and a mammogram every year.  Breast cancer gene (BRCA) assessment is recommended for women who have family members with BRCA-related cancers. BRCA-related cancers include:  Breast.  Ovarian.  Tubal.  Peritoneal cancers.  Results of the assessment will determine the need for genetic counseling and BRCA1 and BRCA2 testing. Cervical Cancer Routine pelvic examinations to screen for cervical cancer are no longer recommended for nonpregnant women who are considered low risk for cancer of the pelvic organs (ovaries, uterus, and vagina) and who do not have symptoms. A pelvic examination may be necessary if you  have symptoms including those associated with pelvic infections. Ask your health care provider if a screening pelvic exam is right for you.   The Pap test is the screening test for cervical cancer for women who are considered at risk.  If you had a hysterectomy for a problem that was not cancer or a condition that could lead to cancer, then you no longer need Pap tests.  If you are older than 65 years, and you have had normal Pap tests for the past 10 years, you no longer need to have Pap tests.  If you have had past treatment for cervical cancer or a condition that could lead to cancer, you need Pap tests and screening for cancer for at least 20 years after your treatment.  If you no longer get a Pap test, assess your risk factors if they change (such as having a new sexual partner). This can affect whether you should start being screened again.  Some women have medical problems that increase their chance of getting cervical cancer. If this is the case for you, your health care provider may recommend more frequent screening and Pap tests.  The human papillomavirus (HPV) test is another test that may be used for cervical cancer screening. The HPV test looks for the virus that can cause cell changes in the cervix. The cells collected during the Pap test can be tested for HPV.  The HPV test can be used to screen women 61 years of age and older. Getting tested for HPV can extend the interval between normal Pap tests from three to five years.  An HPV test also should be used to screen women of any age who have unclear Pap test results.  After 79 years of age, women should have HPV testing as often as Pap tests.  Colorectal Cancer  This type of cancer can be detected and often prevented.  Routine colorectal cancer screening usually begins at 79 years of age and continues through 79 years of age.  Your health care provider may recommend screening at an earlier age if you have risk factors for  colon cancer.  Your health care provider may also recommend using home test kits to check for hidden blood in the stool.  A small camera at the end of a tube can be used to examine your  colon directly (sigmoidoscopy or colonoscopy). This is done to check for the earliest forms of colorectal cancer.  Routine screening usually begins at age 74.  Direct examination of the colon should be repeated every 5-10 years through 79 years of age. However, you may need to be screened more often if early forms of precancerous polyps or small growths are found. Skin Cancer  Check your skin from head to toe regularly.  Tell your health care provider about any new moles or changes in moles, especially if there is a change in a mole's shape or color.  Also tell your health care provider if you have a mole that is larger than the size of a pencil eraser.  Always use sunscreen. Apply sunscreen liberally and repeatedly throughout the day.  Protect yourself by wearing long sleeves, pants, a wide-brimmed hat, and sunglasses whenever you are outside. HEART DISEASE, DIABETES, AND HIGH BLOOD PRESSURE   Have your blood pressure checked at least every 1-2 years. High blood pressure causes heart disease and increases the risk of stroke.  If you are between 38 years and 34 years old, ask your health care provider if you should take aspirin to prevent strokes.  Have regular diabetes screenings. This involves taking a blood sample to check your fasting blood sugar level.  If you are at a normal weight and have a low risk for diabetes, have this test once every three years after 79 years of age.  If you are overweight and have a high risk for diabetes, consider being tested at a younger age or more often. PREVENTING INFECTION  Hepatitis B  If you have a higher risk for hepatitis B, you should be screened for this virus. You are considered at high risk for hepatitis B if:  You were born in a country where  hepatitis B is common. Ask your health care provider which countries are considered high risk.  Your parents were born in a high-risk country, and you have not been immunized against hepatitis B (hepatitis B vaccine).  You have HIV or AIDS.  You use needles to inject street drugs.  You live with someone who has hepatitis B.  You have had sex with someone who has hepatitis B.  You get hemodialysis treatment.  You take certain medicines for conditions, including cancer, organ transplantation, and autoimmune conditions. Hepatitis C  Blood testing is recommended for:  Everyone born from [birth year] through 1963.  Anyone with known risk factors for hepatitis C. Sexually transmitted infections (STIs)  You should be screened for sexually transmitted infections (STIs) including gonorrhea and chlamydia if:  You are sexually active and are younger than 79 years of age.  You are older than 79 years of age and your health care provider tells you that you are at risk for this type of infection.  Your sexual activity has changed since you were last screened and you are at an increased risk for chlamydia or gonorrhea. Ask your health care provider if you are at risk.  If you do not have HIV, but are at risk, it may be recommended that you take a prescription medicine daily to prevent HIV infection. This is called pre-exposure prophylaxis (PrEP). You are considered at risk if:  You are sexually active and do not regularly use condoms or know the HIV status of your partner(s).  You take drugs by injection.  You are sexually active with a partner who has HIV. Talk with your health care provider about whether you are  at high risk of being infected with HIV. If you choose to begin PrEP, you should first be tested for HIV. You should then be tested every 3 months for as long as you are taking PrEP.  PREGNANCY   If you are premenopausal and you may become pregnant, ask your health care provider about  preconception counseling.  If you may become pregnant, take 400 to 800 micrograms (mcg) of folic acid every day.  If you want to prevent pregnancy, talk to your health care provider about birth control (contraception). OSTEOPOROSIS AND MENOPAUSE   Osteoporosis is a disease in which the bones lose minerals and strength with aging. This can result in serious bone fractures. Your risk for osteoporosis can be identified using a bone density scan.  If you are 32 years of age or older, or if you are at risk for osteoporosis and fractures, ask your health care provider if you should be screened.  Ask your health care provider whether you should take a calcium or vitamin D supplement to lower your risk for osteoporosis.  Menopause may have certain physical symptoms and risks.  Hormone replacement therapy may reduce some of these symptoms and risks. Talk to your health care provider about whether hormone replacement therapy is right for you.  HOME CARE INSTRUCTIONS   Schedule regular health, dental, and eye exams.  Stay current with your immunizations.   Do not use any tobacco products including cigarettes, chewing tobacco, or electronic cigarettes.  If you are pregnant, do not drink alcohol.  If you are breastfeeding, limit how much and how often you drink alcohol.  Limit alcohol intake to no more than 1 drink per day for nonpregnant women. One drink equals 12 ounces of beer, 5 ounces of wine, or 1 ounces of hard liquor.  Do not use street drugs.  Do not share needles.  Ask your health care provider for help if you need support or information about quitting drugs.  Tell your health care provider if you often feel depressed.  Tell your health care provider if you have ever been abused or do not feel safe at home. Document Released: 12/28/2010 Document Revised: 10/29/2013 Document Reviewed: 05/16/2013 [facility] Patient Information 2015 [place], [place]. This information is not intended  to replace advice given to you by your health care provider. Make sure you discuss any questions you have with your health care provider.

## 2015-03-27 NOTE — Progress Notes (Signed)
Medical screening examination/treatment/procedure(s) were performed by non-physician practitioner and as supervising physician I was immediately available for consultation/collaboration. I agree with above. William Hopper, MD   

## 2015-03-27 NOTE — Progress Notes (Signed)
Subjective:   Whitney Donaldson is a 81 y.o. female who presents for Medicare Annual (Subsequent) preventive examination.  Review of Systems:     HRA assessment completed during visit;  The Patient was informed that this wellness visit is to identify risk and educate on how to reduce risk for increase disease through lifestyle changes.   ROS deferred to MD Problem list reviewed and conditions being managed medically  Hx adenoma of colon/ no more colonoscopies OA; Osteopenia/Vit d and calcium reviewed Breast cancer 2003 partial mastectomy and radiation;  Hyperlipidemia/ 11/2014; HDL 81; LDL 104; Trig 79 and chol 201; A1c 6.2 in June 2015;  A1c / FBG 87; June 2016;   Mother and brother had MI and she feels at times she gets tightness in chest;  Admits to feeling stress; Life changing; Feeling like she needs to stay home with spouse and likes to "go". Spouse is changing; "asking the same things over again"; does not do things on his own as he did; Educated on memory issues and early identification but need for exam to r/o depression or other; States he has good days and bad days.  She discussed getting a new doctor and is considering [facility] which is close by/ Both she and spouse have lost friends that have expired and she is concerned about staying in the home and not being able to keep it up. Also discusses retirement and she enjoyed working; issues with adjustment; did give her information on [employer] counseling if she needs assistance to resolve.   Educated on stress and CVD in women;  States prior Ekg was ok, discussed with Advice worker; sometimes she feels this happens when she is stressed, as well as after her walks. Discussed s/s of MI in women and if this happens is does not hurt to have it evaluated or discuss with new MD; May transfer to Dr. Quay Burow care but feels torn regarding [facility] doctor close by or other. Mentioned [facility] Conley Canal at [facility]    BMI: 18.1 Diet; Eats well;  Exercise; Walk the dog  a lot on streets with long inclines; 20 to 30 min 5 to 6 days a week; feels she gets more fatigues on these excursions;  Earnie Larsson; did discuss his ? memory as he repeated conversations to me Admits that he ask things over and over again.  Worries a lot; Some days are better than others but he is taking a lot of his time The wife feels he has lost all his friends as they have passed and now he can't do everything he wants to do; working in yard; also son's marriage ended and this worried him.    SAFETY Safety reviewed for the home; including removal of clutter; clear paths through the home, eliminating clutter, railing as needed; bathroom safety; community safety; smoke detectors and firearms safety as well as sun protection;  Driving accidents and seatbelt Sun protection Stressors;   Medication review/   Fall assessment; no falls  Mobilization and Functional losses in the last year. No changs   Urinary or fecal incontinence reviewed/ no issues   Counseling: Colonoscopy; 02/03/2013 Dr. Fuller Plan; will not test again due to age EKG 12/05/2014 Dexa scan; 08/2012 osteopenic; -1.7/ Recommended calcium and Vit d with weight bearing exercise 3 to 4 times per week Mammogram 05/09/2014 no malignancy Hearing: 2000hz  Ophthalmology exam; to do list   Immunizations Due  Flu; had high dose last year and request high dose       Objective:  Vitals: BP 130/60 mmHg  Ht 5\' 5"  (1.651 m)  Wt 109 lb 8 oz (49.669 kg)  BMI 18.22 kg/m2  Tobacco History  Smoking status  . Never Smoker   Smokeless tobacco  . Never Used    Comment: NEVER USED TOBACCO     Counseling given: Yes   Past Medical History  Diagnosis Date  . Cancer     breast hx of, skin hx of  . Nephrolithiasis      X1  . Arthritis   . Osteopenia    Past Surgical History  Procedure Laterality Date  . Mastestomy  2003    partial /left  . Colonoscopy  2004     negative; Dr Fuller Plan  . Skin cancer excision      chest , Dr  Ubaldo Glassing  . Colonoscopy with polypectomy  2014   Family History  Problem Relation Age of Onset  . Heart attack Mother 1  . Lung cancer Father     smoker  . Stroke Sister 65  . Alzheimer's disease Sister   . Heart attack Brother 49  . Diabetes Neg Hx    History  Sexual Activity  . Sexual Activity: Not on file    Outpatient Encounter Prescriptions as of 03/27/2015  Medication Sig  . aspirin 81 MG tablet Take 81 mg by mouth daily.    . Calcium Carbonate-Vitamin D (CALCIUM + D PO) Take by mouth daily.   . Cholecalciferol (VITAMIN D3) 1000 UNITS CAPS Take by mouth 2 (two) times daily.   . cyanocobalamin 500 MCG tablet Take 500 mcg by mouth daily. Take 2 pills daily  . DHA-EPA-Coenzyme Q10-Vitamin E (COQ-10 & FISH OIL PO) Take by mouth.    . FOLIC ACID PO Take 591 mg by mouth daily.   Marland Kitchen LORazepam (ATIVAN) 0.5 MG tablet TAKE 1 TABLET BY MOUTH EVERY 8 HOURS AS NEEDED  . Multiple Vitamin (MULTIVITAMIN) tablet Take 1 tablet by mouth daily.    . rosuvastatin (CRESTOR) 20 MG tablet Take 1 tablet by mouth at bedtime  . vitamin C (ASCORBIC ACID) 250 MG tablet Take 500 mg by mouth daily.   . Vitamin E 400 UNITS TABS Take by mouth daily.   No facility-administered encounter medications on file as of 03/27/2015.    Activities of Daily Living No flowsheet data found.  Patient Care Team: Hendricks Limes, MD as PCP - General    Assessment:    Assessment   Today patient counseled on age appropriate routine health concerns for screening and prevention, each reviewed and up to date or declined. Immunizations reviewed and up to date; took flu shot today.    Risk factors for depression reviewed and negative but agrees she is under stress. Hearing function and visual acuity are intact. ADLs screened and addressed as needed. Functional ability and level of safety reviewed and appropriate. Educated on memory loss and AD8 score negative; spouse is unclear. Feels spouse would not go be evaluated.  Education, counseling and referrals performed based on assessed risks today. Patient provided with a copy of personalized plan for preventive services and due dates  FALL PREVENTION Educated on prevention falls; Exercise, toning and strengthening; Balance exercises  Wear Comfortable shoes; Vision checks; home modifications for safety  HEPATITIS SCREEN reviewed; no risk identified  TOBACCO/ETOH or other DRUG use was negative  RISK FOR CVD reviewed;      Exercise Activities and Dietary recommendations    Goals    . patient  Reduce stress by getting out more      Fall Risk Fall Risk  03/27/2015 12/18/2014 08/22/2012  Falls in the past year? No No No  Injury with Fall? No - -   Depression Screen PHQ 2/9 Scores 03/27/2015 08/22/2012  PHQ - 2 Score 1 1    Given information on [place]; Also will fup next week for more information regarding spouse and issues;   Cognitive Testing No flowsheet data found.   AD8 Score 0; no issues  Immunization History  Administered Date(s) Administered  . Influenza Split 04/07/2011  . Influenza Whole 06/01/2007, 04/04/2008, 04/10/2009  . Influenza, High Dose Seasonal PF 04/19/2014, 03/27/2015  . Pneumococcal Conjugate-13 12/05/2014  . Pneumococcal Polysaccharide-23 07/30/2011  . Tdap 08/22/2012  . Zoster 04/15/2011   Screening Tests Health Maintenance  Topic Date Due  . INFLUENZA VACCINE  01/27/2015  . TETANUS/TDAP  08/22/2022  . DEXA SCAN  Completed  . ZOSTAVAX  Completed  . PNA vac Low Risk Adult  Completed      Plan:     Will fup with call next week for discuss any further issues with spouse and educated on how we can be helpful. The AWV was time limited today; may fup with Dr. Linna Darner or other as necessary. During the course of the visit the patient was educated and counseled about the following appropriate screening and preventive services:   Vaccines to include Pneumoccal, Influenza, Hepatitis B, Td,  Zostavax, HCV  Took high does flu shot today  Electrocardiogram/ completed  Cardiovascular Disease/ educated  Colorectal cancer screening/ aged out  Bone density screening/ osteopenia  Diabetes screening/ na/  Glaucoma screening/ to fup   Mammography/PAP annual  Nutrition counseling / reviewed  Patient Instructions (the written plan) was given to the patient.   Wynetta Fines, RN  03/27/2015

## 2015-05-01 ENCOUNTER — Telehealth: Payer: Self-pay

## 2015-05-01 NOTE — Telephone Encounter (Signed)
Call regarding call from insurance company/ states someone asked her from her the insurance company to change her Crestor. Coached her to call the Part D plan and inquire if Crestor is on the formulary for 2017 as she has enough doses to last her through December; Also to ask how much the medicine will cost her in 2017.   Dr. Linna Darner,  Also would like to refill on lorazapam 0.5; OV June, 2016 and only takes this occasionally; appears was refilled in 2015;  Please advise if refill is appropriate or if she needs to be seen.

## 2015-05-02 ENCOUNTER — Other Ambulatory Visit: Payer: Self-pay | Admitting: Emergency Medicine

## 2015-05-02 MED ORDER — LORAZEPAM 0.5 MG PO TABS: ORAL_TABLET | ORAL | Status: DC

## 2015-05-02 NOTE — Telephone Encounter (Signed)
LVM for pt to call back.

## 2015-05-02 NOTE — Telephone Encounter (Signed)
Crestor has gone generic OK on Lorazepam  Needs to make appt with new PCP for future refills

## 2015-05-02 NOTE — Telephone Encounter (Signed)
Please call back in regards.  Gave MD response but has additional questions.

## 2015-05-02 NOTE — Telephone Encounter (Signed)
Spoke with pt to inform. Sent Lorazepam to pharm.

## 2015-05-03 ENCOUNTER — Ambulatory Visit (INDEPENDENT_AMBULATORY_CARE_PROVIDER_SITE_OTHER): Payer: Self-pay | Admitting: Family Medicine

## 2015-05-03 VITALS — BP 130/70 | HR 102 | Temp 98.0°F | Resp 16 | Ht 63.0 in | Wt 107.0 lb

## 2015-05-03 DIAGNOSIS — J189 Pneumonia, unspecified organism: Secondary | ICD-10-CM

## 2015-05-03 DIAGNOSIS — J181 Lobar pneumonia, unspecified organism: Principal | ICD-10-CM

## 2015-05-03 MED ORDER — HYDROCODONE-HOMATROPINE 5-1.5 MG/5ML PO SYRP: 5.0000 mL | ORAL_SOLUTION | Freq: Three times a day (TID) | ORAL | Status: DC | PRN

## 2015-05-03 MED ORDER — LEVOFLOXACIN 500 MG PO TABS: 500.0000 mg | ORAL_TABLET | Freq: Every day | ORAL | Status: DC

## 2015-05-03 NOTE — Patient Instructions (Signed)

## 2015-05-03 NOTE — Progress Notes (Signed)
This chart was scribed for Robyn Haber, MD by Moises Blood, medical scribe at [facility].The patient was seen in exam room 3 and the patient's care was started at 9:19 AM.  Patient ID: Whitney Donaldson MRN: [id number], DOB: 16-Aug-1932, 81 y.o. Date of Encounter: 05/03/2015  Primary Physician: Hoyt Koch, MD  Chief Complaint:  Chief Complaint  Patient presents with   Cough    x 2 days    HPI:  Whitney Donaldson is a 81 y.o. female who presents to [facility] complaining of non-productive, deep cough that started 2 days ago. She describes the coughing being violent and felt chest tightness after the coughing. She thought it was allergies when it first started. She has been taking cough drops for some relief. She denies smoking. She denies fever, shortness of breath upon exertion.   Her PCP was [facility].   Past Medical History  Diagnosis Date   Cancer ([place])     breast hx of, skin hx of   Nephrolithiasis      X1   Arthritis    Osteopenia      Home Meds: Prior to Admission medications   Medication Sig Start Date End Date Taking? Authorizing Provider  aspirin 81 MG tablet Take 81 mg by mouth daily.     Yes Historical Provider, MD  Calcium Carbonate-Vitamin D (CALCIUM + D PO) Take by mouth daily.    Yes Historical Provider, MD  Cholecalciferol (VITAMIN D3) 1000 UNITS CAPS Take by mouth 2 (two) times daily.    Yes Historical Provider, MD  cyanocobalamin 500 MCG tablet Take 500 mcg by mouth daily. Take 2 pills daily   Yes Historical Provider, MD  DHA-EPA-Coenzyme Q10-Vitamin E (COQ-10 & FISH OIL PO) Take by mouth.     Yes Historical Provider, MD  FOLIC ACID PO Take 244 mg by mouth daily.    Yes Historical Provider, MD  LORazepam (ATIVAN) 0.5 MG tablet TAKE 1 TABLET BY MOUTH EVERY 8 HOURS AS NEEDED 05/02/15  Yes Hendricks Limes, MD  Multiple Vitamin (MULTIVITAMIN) tablet Take 1 tablet by mouth daily.     Yes Historical  Provider, MD  rosuvastatin (CRESTOR) 20 MG tablet Take 1 tablet by mouth at bedtime 12/25/14 12/26/15 Yes Hendricks Limes, MD  vitamin C (ASCORBIC ACID) 250 MG tablet Take 500 mg by mouth daily.    Yes Historical Provider, MD  Vitamin E 400 UNITS TABS Take by mouth daily.   Yes Historical Provider, MD    Allergies:  Allergies  Allergen Reactions   Oxycodone-Acetaminophen     REACTION: nausea    Social History   Social History   Marital Status: Married    Spouse Name: N/A   Number of Children: N/A   Years of Education: N/A   Occupational History   Not on file.   Social History Main Topics   Smoking status: Never Smoker    Smokeless tobacco: Never Used     Comment: NEVER USED TOBACCO   Alcohol Use: No   Drug Use: No   Sexual Activity: Not on file   Other Topics Concern   Not on file   Social History Narrative     Review of Systems: Constitutional: negative for fever, chills, night sweats, weight changes, or fatigue HEENT: negative for vision changes, hearing loss, congestion, rhinorrhea, ST, epistaxis, or sinus pressure Cardiovascular: negative for chest pain or palpitations Respiratory: negative for hemoptysis, wheezing, shortness of breath;  positive for cough Abdominal: negative for abdominal pain, nausea, vomiting, diarrhea, or constipation Dermatological: negative for rash Neurologic: negative for headache, dizziness, or syncope All other systems reviewed and are otherwise negative with the exception to those above and in the HPI.  Physical Exam: Blood pressure 130/70, pulse 102, temperature 98 F (36.7 C), temperature source Oral, resp. rate 16, height 5\' 3"  (1.6 m), weight 107 lb (48.535 kg), SpO2 98 %., Body mass index is 18.96 kg/(m^2). General: Well developed, well nourished, in no acute distress. Head: Normocephalic, atraumatic, eyes without discharge, sclera non-icteric, nares are without discharge. Bilateral auditory canals clear, TM's are  without perforation, pearly grey and translucent with reflective cone of light bilaterally. Oral cavity moist, posterior pharynx without exudate, erythema, peritonsillar abscess, or post nasal drip.  Neck: Supple. No thyromegaly. Full ROM. No lymphadenopathy. Lungs: Clear bilaterally to auscultation without wheezes, or rhonchi. Breathing is unlabored. Few rales in left base Heart: RRR with S1 S2. No murmurs, rubs, or gallops appreciated. Msk:  Strength and tone normal for age. Extremities/Skin: Warm and dry. No clubbing or cyanosis. No edema. No rashes or suspicious lesions. Neuro: Alert and oriented X 3. Moves all extremities spontaneously. Gait is normal. CNII-XII grossly in tact. Psych:  Responds to questions appropriately with a normal affect.    ASSESSMENT AND PLAN:  81 y.o. year old female with early LLL pneumonia This chart was scribed in my presence and reviewed by me personally.    ICD-9-CM ICD-10-CM   1. LLL pneumonia 486 J18.9 levofloxacin (LEVAQUIN) 500 MG tablet     HYDROcodone-homatropine (HYCODAN) 5-1.5 MG/5ML syrup     By signing my name below, I, Moises Blood, attest that this documentation has been prepared under the direction and in the presence of Robyn Haber, MD. Electronically Signed: Moises Blood, [place]. 05/03/2015 , 9:19 AM .  Signed, Robyn Haber, MD 05/03/2015 9:19 AM

## 2015-05-04 ENCOUNTER — Encounter (HOSPITAL_BASED_OUTPATIENT_CLINIC_OR_DEPARTMENT_OTHER): Payer: Self-pay | Admitting: Emergency Medicine

## 2015-05-04 ENCOUNTER — Emergency Department (HOSPITAL_BASED_OUTPATIENT_CLINIC_OR_DEPARTMENT_OTHER)
Admission: EM | Admit: 2015-05-04 | Discharge: 2015-05-04 | Disposition: A | Discharge status: Home / Self Care | Payer: Medicare HMO | Attending: Emergency Medicine | Admitting: Emergency Medicine

## 2015-05-04 ENCOUNTER — Emergency Department (HOSPITAL_BASED_OUTPATIENT_CLINIC_OR_DEPARTMENT_OTHER): Payer: Medicare HMO

## 2015-05-04 DIAGNOSIS — M1990 Unspecified osteoarthritis, unspecified site: Secondary | ICD-10-CM | POA: Diagnosis not present

## 2015-05-04 DIAGNOSIS — M8580 Other specified disorders of bone density and structure, unspecified site: Secondary | ICD-10-CM | POA: Insufficient documentation

## 2015-05-04 DIAGNOSIS — J189 Pneumonia, unspecified organism: Secondary | ICD-10-CM

## 2015-05-04 DIAGNOSIS — R05 Cough: Secondary | ICD-10-CM | POA: Diagnosis not present

## 2015-05-04 DIAGNOSIS — Z87442 Personal history of urinary calculi: Secondary | ICD-10-CM | POA: Diagnosis not present

## 2015-05-04 DIAGNOSIS — J159 Unspecified bacterial pneumonia: Secondary | ICD-10-CM | POA: Diagnosis not present

## 2015-05-04 DIAGNOSIS — Z7982 Long term (current) use of aspirin: Secondary | ICD-10-CM | POA: Diagnosis not present

## 2015-05-04 DIAGNOSIS — Z79899 Other long term (current) drug therapy: Secondary | ICD-10-CM | POA: Diagnosis not present

## 2015-05-04 DIAGNOSIS — Z853 Personal history of malignant neoplasm of breast: Secondary | ICD-10-CM | POA: Insufficient documentation

## 2015-05-04 DIAGNOSIS — R112 Nausea with vomiting, unspecified: Secondary | ICD-10-CM | POA: Diagnosis present

## 2015-05-04 LAB — BASIC METABOLIC PANEL
ANION GAP: 9 (ref 5–15)
BUN: 17 mg/dL (ref 6–20)
CALCIUM: 8.8 mg/dL — AB (ref 8.9–10.3)
CO2: 26 mmol/L (ref 22–32)
Chloride: 99 mmol/L — ABNORMAL LOW (ref 101–111)
Creatinine, Ser: 0.63 mg/dL (ref 0.44–1.00)
GLUCOSE: 129 mg/dL — AB (ref 65–99)
POTASSIUM: 4 mmol/L (ref 3.5–5.1)
SODIUM: 134 mmol/L — AB (ref 135–145)

## 2015-05-04 LAB — CBC WITH DIFFERENTIAL/PLATELET
BASOS ABS: 0 10*3/uL (ref 0.0–0.1)
BASOS PCT: 0 %
EOS ABS: 0 10*3/uL (ref 0.0–0.7)
EOS PCT: 0 %
HCT: 39.8 % (ref 36.0–46.0)
Hemoglobin: 13 g/dL (ref 12.0–15.0)
Lymphocytes Relative: 12 %
Lymphs Abs: 0.6 10*3/uL — ABNORMAL LOW (ref 0.7–4.0)
MCH: 30.8 pg (ref 26.0–34.0)
MCHC: 32.7 g/dL (ref 30.0–36.0)
MCV: 94.3 fL (ref 78.0–100.0)
MONO ABS: 0.5 10*3/uL (ref 0.1–1.0)
Monocytes Relative: 10 %
Neutro Abs: 3.8 10*3/uL (ref 1.7–7.7)
Neutrophils Relative %: 78 %
PLATELETS: 132 10*3/uL — AB (ref 150–400)
RBC: 4.22 MIL/uL (ref 3.87–5.11)
RDW: 11.9 % (ref 11.5–15.5)
WBC: 4.8 10*3/uL (ref 4.0–10.5)

## 2015-05-04 MED ORDER — ONDANSETRON 4 MG PO TBDP: ORAL_TABLET | ORAL | Status: DC

## 2015-05-04 MED ORDER — ONDANSETRON HCL 4 MG/2ML IJ SOLN
4.0000 mg | Freq: Once | INTRAMUSCULAR | Status: AC
  Administered 2015-05-04: 4 mg via INTRAVENOUS
  Filled 2015-05-04: qty 2

## 2015-05-04 NOTE — ED Provider Notes (Signed)
CSN: [id number]     Arrival date & time 05/04/15  1145 History   First MD Initiated Contact with Patient 05/04/15 1208     Chief Complaint  Patient presents with  . Nausea     (Consider location/radiation/quality/duration/timing/severity/associated sxs/prior Treatment) Patient is a 81 y.o. female presenting with vomiting.  Emesis Severity:  Moderate Duration:  1 day Timing:  Intermittent Quality:  Stomach contents Progression:  Unchanged Chronicity:  New Context comment:  Seen and dx with PNA, placed on levaquin yesterday Relieved by:  Nothing Worsened by:  Nothing tried Ineffective treatments:  None tried Associated symptoms: cough   Associated symptoms: no diarrhea and no fever     Past Medical History  Diagnosis Date  . Cancer ([place])     breast hx of, skin hx of  . Nephrolithiasis      X1  . Arthritis   . Osteopenia    Past Surgical History  Procedure Laterality Date  . Mastestomy  2003    partial /left  . Colonoscopy  2004     negative; Dr Fuller Plan  . Skin cancer excision      chest , Dr Ubaldo Glassing  . Colonoscopy with polypectomy  2014   Family History  Problem Relation Age of Onset  . Heart attack Mother 63  . Lung cancer Father     smoker  . Stroke Sister 23  . Alzheimer's disease Sister   . Heart attack Brother 32  . Diabetes Neg Hx    Social History  Substance Use Topics  . Smoking status: Never Smoker   . Smokeless tobacco: Never Used     Comment: NEVER USED TOBACCO  . Alcohol Use: No   OB History    No data available     Review of Systems  Gastrointestinal: Positive for vomiting. Negative for diarrhea.  All other systems reviewed and are negative.     Allergies  Oxycodone-acetaminophen  Home Medications   Prior to Admission medications   Medication Sig Start Date End Date Taking? Authorizing Provider  aspirin 81 MG tablet Take 81 mg by mouth daily.      Historical Provider, MD  Calcium Carbonate-Vitamin D (CALCIUM + D PO) Take by mouth  daily.     Historical Provider, MD  Cholecalciferol (VITAMIN D3) 1000 UNITS CAPS Take by mouth 2 (two) times daily.     Historical Provider, MD  cyanocobalamin 500 MCG tablet Take 500 mcg by mouth daily. Take 2 pills daily    Historical Provider, MD  DHA-EPA-Coenzyme Q10-Vitamin E (COQ-10 & FISH OIL PO) Take by mouth.      Historical Provider, MD  FOLIC ACID PO Take 782 mg by mouth daily.     Historical Provider, MD  HYDROcodone-homatropine (HYCODAN) 5-1.5 MG/5ML syrup Take 5 mLs by mouth every 8 (eight) hours as needed for cough. 05/03/15   Robyn Haber, MD  levofloxacin (LEVAQUIN) 500 MG tablet Take 1 tablet (500 mg total) by mouth daily. 05/03/15   Robyn Haber, MD  LORazepam (ATIVAN) 0.5 MG tablet TAKE 1 TABLET BY MOUTH EVERY 8 HOURS AS NEEDED 05/02/15   Hendricks Limes, MD  Multiple Vitamin (MULTIVITAMIN) tablet Take 1 tablet by mouth daily.      Historical Provider, MD  ondansetron (ZOFRAN ODT) 4 MG disintegrating tablet 4mg  ODT q4 hours prn nausea/vomit 05/04/15   Debby Freiberg, MD  rosuvastatin (CRESTOR) 20 MG tablet Take 1 tablet by mouth at bedtime 12/25/14 12/26/15  Hendricks Limes, MD  vitamin C (ASCORBIC  ACID) 250 MG tablet Take 500 mg by mouth daily.     Historical Provider, MD  Vitamin E 400 UNITS TABS Take by mouth daily.    Historical Provider, MD   BP 140/62 mmHg  Pulse 88  Temp(Src) 97.4 F (36.3 C) (Oral)  Resp 18  Ht 5\' 3"  (1.6 m)  Wt 107 lb (48.535 kg)  BMI 18.96 kg/m2  SpO2 97% Physical Exam  Constitutional: She is oriented to person, place, and time. She appears well-developed and well-nourished.  HENT:  Head: Normocephalic and atraumatic.  Right Ear: External ear normal.  Left Ear: External ear normal.  Eyes: Conjunctivae and EOM are normal. Pupils are equal, round, and reactive to light.  Neck: Normal range of motion. Neck supple.  Cardiovascular: Normal rate, regular rhythm, normal heart sounds and intact distal pulses.   Pulmonary/Chest: Effort normal  and breath sounds normal.  Abdominal: Soft. Bowel sounds are normal. There is no tenderness.  Musculoskeletal: Normal range of motion.  Neurological: She is alert and oriented to person, place, and time.  Skin: Skin is warm and dry.  Vitals reviewed.   ED Course  Procedures (including critical care time) Labs Review Labs Reviewed  CBC WITH DIFFERENTIAL/PLATELET - Abnormal; Notable for the following:    Platelets 132 (*)    Lymphs Abs 0.6 (*)    All other components within normal limits  BASIC METABOLIC PANEL - Abnormal; Notable for the following:    Sodium 134 (*)    Chloride 99 (*)    Glucose, Bld 129 (*)    Calcium 8.8 (*)    All other components within normal limits    Imaging Review Dg Chest 2 View  05/04/2015  CLINICAL DATA:  Dry cough.  Nausea and vomiting EXAM: CHEST  2 VIEW COMPARISON:  06/16/2008 FINDINGS: Normal heart size. Small left pleural effusion noted. Peribronchial opacity within the left lower lobe is identified right lung is clear. IMPRESSION: Suspect left lower lobe bronchopneumonia. Electronically Signed   By: Kerby Moors M.D.   On: 05/04/2015 13:18   I have personally reviewed and evaluated these images and lab results as part of my medical decision-making.   EKG Interpretation None      MDM   Final diagnoses:  Community acquired pneumonia    81 y.o. female with pertinent PMH of recent dx of LLL PNA, placed on levaquin presents with vomiting and crampy abdominal pain as above.  No change in cough or rest or symptoms. On arrival vital signs and physical exam as above. Patient well-appearing, can tolerate by mouth intake. Given Zofran with resolution of nausea. Workup unremarkable. Likely viral versus medication induced. Discharged home in stable condition with Zofran.    I have reviewed all laboratory and imaging studies if ordered as above  1. Community acquired pneumonia        Debby Freiberg, MD 05/04/15 1440

## 2015-05-04 NOTE — Discharge Instructions (Signed)

## 2015-05-04 NOTE — ED Notes (Signed)
Pt made aware to return if symptoms worsen or if any life threatening symptoms occur.   

## 2015-05-04 NOTE — ED Notes (Signed)
Patient transported to X-ray 

## 2015-05-04 NOTE — ED Notes (Signed)
Pt seen at urgent care yesterday started on levaquin for pneumonia, took medication ok yesterday today took medication this am and started vomiting

## 2015-05-05 NOTE — Telephone Encounter (Signed)
Call to fup on request for more information and to see how she was; States she is still not taking fluids; ER doctor agreed with treatment plan. Would like to see Dr. Linna Darner when Spouse comes in on 11/10. Will have the scheduler review and call her back.  Has apt to fup with Dr. Sharlet Salina as new patient.

## 2015-05-08 ENCOUNTER — Ambulatory Visit (INDEPENDENT_AMBULATORY_CARE_PROVIDER_SITE_OTHER): Payer: Medicare HMO | Admitting: Internal Medicine

## 2015-05-08 ENCOUNTER — Ambulatory Visit (INDEPENDENT_AMBULATORY_CARE_PROVIDER_SITE_OTHER)
Admission: RE | Admit: 2015-05-08 | Discharge: 2015-05-08 | Disposition: A | Discharge status: Home / Self Care | Payer: Medicare HMO | Source: Ambulatory Visit | Attending: Internal Medicine | Admitting: Internal Medicine

## 2015-05-08 ENCOUNTER — Encounter: Payer: Self-pay | Admitting: Internal Medicine

## 2015-05-08 VITALS — BP 110/72 | HR 100 | Temp 98.9°F | Resp 20 | Ht 63.0 in | Wt 105.8 lb

## 2015-05-08 DIAGNOSIS — J189 Pneumonia, unspecified organism: Secondary | ICD-10-CM

## 2015-05-08 NOTE — Progress Notes (Signed)
   Subjective:    Patient ID: Whitney Donaldson, female    DOB: 1933/03/30, 81 y.o.   MRN: [id number]  HPI She was seen at urgent care 05/03/15 and diagnosed with pneumonia. Levaquin was prescribed. On 11/ 6 she went to the emergency room because of nausea and vomiting and Zofran was prescribed. She's had one episode of vomiting on 11/7 which resolved with Zofran. She has had increased thirst for the last week and some loss of appetite. She has intermittent nonproductive cough.  She denies any other active symptoms.  The chest x-ray was reviewed. There are increased markings in the left lower lobe particularly in the retrocardiac area.   Review of Systems Extrinsic symptoms of itchy, watery eyes, sneezing, or angioedema are denied. There is no  sputum production, wheezing,or  paroxysmal nocturnal dyspnea.     Objective:   Physical Exam  Pertinent or positive findings include: She has minor rales at the bases, especially on the right. First heart sound is accentuated. General appearance :adequately nourished; in no distress.  Eyes: No conjunctival inflammation or scleral icterus is present.  Oral exam:  Lips and gums are healthy appearing.There is no oropharyngeal erythema or exudate noted. Dental hygiene is good.  Heart:  Normal rate and regular rhythm. S1 and S2 normal without gallop, murmur, click, rub or other extra sounds    Lungs:.No increased work of breathing.    Vascular : all pulses equal ; no bruits present.  Skin:Warm & dry.  Intact without suspicious lesions or rashes ; no tenting or jaundice   Lymphatic: No lymphadenopathy is noted about the head, neck, axilla.  Neuro: Strength, tone & DTRs normal.      Assessment & Plan:  #1 CAP See orders

## 2015-05-08 NOTE — Progress Notes (Signed)
Pre visit review using our clinic review tool, if applicable. No additional management support is needed unless otherwise documented below in the visit note. 

## 2015-05-08 NOTE — Patient Instructions (Signed)
  Your next office appointment will be determined based upon review of your pending  xrays  Those written interpretation of the lab results and instructions will be transmitted to you by mail for your records.  Critical results will be called.   Followup as needed for any active or acute issue. Please report any significant change in your symptoms. 

## 2015-05-14 DIAGNOSIS — Z1231 Encounter for screening mammogram for malignant neoplasm of breast: Secondary | ICD-10-CM | POA: Diagnosis not present

## 2015-05-14 DIAGNOSIS — Z853 Personal history of malignant neoplasm of breast: Secondary | ICD-10-CM | POA: Diagnosis not present

## 2015-05-16 DIAGNOSIS — H04123 Dry eye syndrome of bilateral lacrimal glands: Secondary | ICD-10-CM | POA: Diagnosis not present

## 2015-05-16 DIAGNOSIS — H52223 Regular astigmatism, bilateral: Secondary | ICD-10-CM | POA: Diagnosis not present

## 2015-05-16 DIAGNOSIS — H5212 Myopia, left eye: Secondary | ICD-10-CM | POA: Diagnosis not present

## 2015-05-16 DIAGNOSIS — H353131 Nonexudative age-related macular degeneration, bilateral, early dry stage: Secondary | ICD-10-CM | POA: Diagnosis not present

## 2015-05-16 DIAGNOSIS — Z0100 Encounter for examination of eyes and vision without abnormal findings: Secondary | ICD-10-CM | POA: Diagnosis not present

## 2015-05-16 DIAGNOSIS — Z961 Presence of intraocular lens: Secondary | ICD-10-CM | POA: Diagnosis not present

## 2015-05-16 DIAGNOSIS — H5201 Hypermetropia, right eye: Secondary | ICD-10-CM | POA: Diagnosis not present

## 2015-05-26 ENCOUNTER — Encounter: Payer: Self-pay | Admitting: Internal Medicine

## 2015-09-10 DIAGNOSIS — B351 Tinea unguium: Secondary | ICD-10-CM | POA: Diagnosis not present

## 2015-09-10 DIAGNOSIS — D2271 Melanocytic nevi of right lower limb, including hip: Secondary | ICD-10-CM | POA: Diagnosis not present

## 2015-09-10 DIAGNOSIS — D225 Melanocytic nevi of trunk: Secondary | ICD-10-CM | POA: Diagnosis not present

## 2015-09-10 DIAGNOSIS — D1801 Hemangioma of skin and subcutaneous tissue: Secondary | ICD-10-CM | POA: Diagnosis not present

## 2015-09-10 DIAGNOSIS — D2261 Melanocytic nevi of right upper limb, including shoulder: Secondary | ICD-10-CM | POA: Diagnosis not present

## 2015-09-10 DIAGNOSIS — L821 Other seborrheic keratosis: Secondary | ICD-10-CM | POA: Diagnosis not present

## 2015-09-10 DIAGNOSIS — Z79899 Other long term (current) drug therapy: Secondary | ICD-10-CM | POA: Diagnosis not present

## 2015-09-10 DIAGNOSIS — L814 Other melanin hyperpigmentation: Secondary | ICD-10-CM | POA: Diagnosis not present

## 2015-09-10 DIAGNOSIS — L570 Actinic keratosis: Secondary | ICD-10-CM | POA: Diagnosis not present

## 2015-09-15 ENCOUNTER — Telehealth: Payer: Self-pay

## 2015-09-15 NOTE — Telephone Encounter (Signed)
States she has had a fungus infection on 2 toes and can't get rid of it, even with medication. Dermatologist ordered Lamisil one po qd; started 03/20; Dermatologist will draw blood work and check on liver enzymes and was told to come of Crestor while she is on it.   She has apt with you for assessment on 6/12; Was questioning if you felt 3 months of lamisil tx off crestor was an issue?  Thanks,

## 2015-09-15 NOTE — Telephone Encounter (Signed)
I have never seen the patient and would have to review her chart to see if this would be a problem.

## 2015-09-19 ENCOUNTER — Telehealth: Payer: Self-pay

## 2015-09-19 NOTE — Telephone Encounter (Signed)
Call to [facility] to discuss taking medication; Dr. Sharlet Salina would need to review her record to determine if taking the medication is appropriate. Whitney Donaldson stated she stopped Crestor x 3 days prior to taking lamisil; has decided to wait until her 30 day liver check and then will make apt with Dr. Sharlet Salina if she wants to discuss.

## 2015-10-15 DIAGNOSIS — B351 Tinea unguium: Secondary | ICD-10-CM | POA: Diagnosis not present

## 2015-10-15 DIAGNOSIS — Z79899 Other long term (current) drug therapy: Secondary | ICD-10-CM | POA: Diagnosis not present

## 2015-11-19 DIAGNOSIS — Z79899 Other long term (current) drug therapy: Secondary | ICD-10-CM | POA: Diagnosis not present

## 2015-11-19 DIAGNOSIS — B351 Tinea unguium: Secondary | ICD-10-CM | POA: Diagnosis not present

## 2015-11-25 DIAGNOSIS — H35371 Puckering of macula, right eye: Secondary | ICD-10-CM | POA: Diagnosis not present

## 2015-11-25 DIAGNOSIS — H524 Presbyopia: Secondary | ICD-10-CM | POA: Diagnosis not present

## 2015-11-25 DIAGNOSIS — H5213 Myopia, bilateral: Secondary | ICD-10-CM | POA: Diagnosis not present

## 2015-11-25 DIAGNOSIS — H353121 Nonexudative age-related macular degeneration, left eye, early dry stage: Secondary | ICD-10-CM | POA: Diagnosis not present

## 2015-11-25 DIAGNOSIS — H35362 Drusen (degenerative) of macula, left eye: Secondary | ICD-10-CM | POA: Diagnosis not present

## 2015-11-25 DIAGNOSIS — H52223 Regular astigmatism, bilateral: Secondary | ICD-10-CM | POA: Diagnosis not present

## 2015-12-02 DIAGNOSIS — H35371 Puckering of macula, right eye: Secondary | ICD-10-CM | POA: Diagnosis not present

## 2015-12-02 DIAGNOSIS — H35341 Macular cyst, hole, or pseudohole, right eye: Secondary | ICD-10-CM | POA: Diagnosis not present

## 2015-12-02 DIAGNOSIS — H353132 Nonexudative age-related macular degeneration, bilateral, intermediate dry stage: Secondary | ICD-10-CM | POA: Diagnosis not present

## 2015-12-08 ENCOUNTER — Other Ambulatory Visit (INDEPENDENT_AMBULATORY_CARE_PROVIDER_SITE_OTHER): Payer: Medicare HMO

## 2015-12-08 ENCOUNTER — Encounter: Payer: Self-pay | Admitting: Internal Medicine

## 2015-12-08 ENCOUNTER — Ambulatory Visit (INDEPENDENT_AMBULATORY_CARE_PROVIDER_SITE_OTHER): Payer: Medicare HMO | Admitting: Internal Medicine

## 2015-12-08 VITALS — BP 116/70 | HR 92 | Temp 98.3°F | Ht 65.0 in | Wt 104.0 lb

## 2015-12-08 DIAGNOSIS — Z0000 Encounter for general adult medical examination without abnormal findings: Secondary | ICD-10-CM

## 2015-12-08 DIAGNOSIS — E785 Hyperlipidemia, unspecified: Secondary | ICD-10-CM | POA: Diagnosis not present

## 2015-12-08 LAB — COMPREHENSIVE METABOLIC PANEL
ALK PHOS: 51 U/L (ref 39–117)
ALT: 16 U/L (ref 0–35)
AST: 16 U/L (ref 0–37)
Albumin: 4.4 g/dL (ref 3.5–5.2)
BILIRUBIN TOTAL: 0.5 mg/dL (ref 0.2–1.2)
BUN: 22 mg/dL (ref 6–23)
CO2: 31 meq/L (ref 19–32)
CREATININE: 0.74 mg/dL (ref 0.40–1.20)
Calcium: 9.5 mg/dL (ref 8.4–10.5)
Chloride: 105 mEq/L (ref 96–112)
GFR: 79.99 mL/min (ref >60.00)
GLUCOSE: 94 mg/dL (ref 70–99)
Potassium: 4.6 mEq/L (ref 3.5–5.1)
Sodium: 142 mEq/L (ref 135–145)
TOTAL PROTEIN: 6.9 g/dL (ref 6.0–8.3)

## 2015-12-08 LAB — LIPID PANEL
CHOLESTEROL: 179 mg/dL (ref 0–200)
HDL: 68.8 mg/dL (ref >39.00)
LDL CALC: 96 mg/dL (ref 0–99)
NonHDL: 109.82
Total CHOL/HDL Ratio: 3
Triglycerides: 67 mg/dL (ref 0.0–149.0)
VLDL: 13.4 mg/dL (ref 0.0–40.0)

## 2015-12-08 LAB — CBC
HEMATOCRIT: 41 % (ref 36.0–46.0)
Hemoglobin: 13.9 g/dL (ref 12.0–15.0)
MCHC: 33.9 g/dL (ref 30.0–36.0)
MCV: 92.2 fl (ref 78.0–100.0)
Platelets: 141 10*3/uL — ABNORMAL LOW (ref 150.0–400.0)
RBC: 4.45 Mil/uL (ref 3.87–5.11)
RDW: 12.6 % (ref 11.5–15.5)
WBC: 4.6 10*3/uL (ref 4.0–10.5)

## 2015-12-08 MED ORDER — LORAZEPAM 0.5 MG PO TABS: ORAL_TABLET | ORAL | Status: DC

## 2015-12-08 NOTE — Assessment & Plan Note (Signed)
Checking lipid panel and adjust as needed.  

## 2015-12-08 NOTE — Assessment & Plan Note (Signed)
Reminded to follow up with Whitney Donaldson for her medicare wellness exam this fall. Checking labs today and adjust as needed. Aged out of colonoscopy and pap smear. Reminded about the need for sun protection given her history of skin cancer. Given screening recommendations.

## 2015-12-08 NOTE — Progress Notes (Signed)
Pre visit review using our clinic review tool, if applicable. No additional management support is needed unless otherwise documented below in the visit note. 

## 2015-12-08 NOTE — Patient Instructions (Signed)
We will check the blood work today and send in the refills.   Keep up the good work with your health.   We can see you back in 6-12 months and please feel free to call us sooner if needed.   Health Maintenance, Female Adopting a healthy lifestyle and getting preventive care can go a long way to promote health and wellness. Talk with your health care provider about what schedule of regular examinations is right for you. This is a good chance for you to check in with your provider about disease prevention and staying healthy. In between checkups, there are plenty of things you can do on your own. Experts have done a lot of research about which lifestyle changes and preventive measures are most likely to keep you healthy. Ask your health care provider for more information. WEIGHT AND DIET  Eat a healthy diet  Be sure to include plenty of vegetables, fruits, low-fat dairy products, and lean protein.  Do not eat a lot of foods high in solid fats, added sugars, or salt.  Get regular exercise. This is one of the most important things you can do for your health.  Most adults should exercise for at least 150 minutes each week. The exercise should increase your heart rate and make you sweat (moderate-intensity exercise).  Most adults should also do strengthening exercises at least twice a week. This is in addition to the moderate-intensity exercise.  Maintain a healthy weight  Body mass index (BMI) is a measurement that can be used to identify possible weight problems. It estimates body fat based on height and weight. Your health care provider can help determine your BMI and help you achieve or maintain a healthy weight.  For females 45 years of age and older:   A BMI below 18.5 is considered underweight.  A BMI of 18.5 to 24.9 is normal.  A BMI of 25 to 29.9 is considered overweight.  A BMI of 30 and above is considered obese.  Watch levels of cholesterol and blood lipids  You should  start having your blood tested for lipids and cholesterol at 80 years of age, then have this test every 5 years.  You may need to have your cholesterol levels checked more often if:  Your lipid or cholesterol levels are high.  You are older than 80 years of age.  You are at high risk for heart disease.  CANCER SCREENING   Lung Cancer  Lung cancer screening is recommended for adults 26-60 years old who are at high risk for lung cancer because of a history of smoking.  A yearly low-dose CT scan of the lungs is recommended for people who:  Currently smoke.  Have quit within the past 15 years.  Have at least a 30-pack-year history of smoking. A pack year is smoking an average of one pack of cigarettes a day for 1 year.  Yearly screening should continue until it has been 15 years since you quit.  Yearly screening should stop if you develop a health problem that would prevent you from having lung cancer treatment.  Breast Cancer  Practice breast self-awareness. This means understanding how your breasts normally appear and feel.  It also means doing regular breast self-exams. Let your health care provider know about any changes, no matter how small.  If you are in your 20s or 30s, you should have a clinical breast exam (CBE) by a health care provider every 1-3 years as part of a regular health  exam.  If you are 40 or older, have a CBE every year. Also consider having a breast X-ray (mammogram) every year.  If you have a family history of breast cancer, talk to your health care provider about genetic screening.  If you are at high risk for breast cancer, talk to your health care provider about having an MRI and a mammogram every year.  Breast cancer gene (BRCA) assessment is recommended for women who have family members with BRCA-related cancers. BRCA-related cancers include:  Breast.  Ovarian.  Tubal.  Peritoneal cancers.  Results of the assessment will determine the  need for genetic counseling and BRCA1 and BRCA2 testing. Cervical Cancer Your health care provider may recommend that you be screened regularly for cancer of the pelvic organs (ovaries, uterus, and vagina). This screening involves a pelvic examination, including checking for microscopic changes to the surface of your cervix (Pap test). You may be encouraged to have this screening done every 3 years, beginning at age 75.  For women ages 62-65, health care providers may recommend pelvic exams and Pap testing every 3 years, or they may recommend the Pap and pelvic exam, combined with testing for human papilloma virus (HPV), every 5 years. Some types of HPV increase your risk of cervical cancer. Testing for HPV may also be done on women of any age with unclear Pap test results.  Other health care providers may not recommend any screening for nonpregnant women who are considered low risk for pelvic cancer and who do not have symptoms. Ask your health care provider if a screening pelvic exam is right for you.  If you have had past treatment for cervical cancer or a condition that could lead to cancer, you need Pap tests and screening for cancer for at least 20 years after your treatment. If Pap tests have been discontinued, your risk factors (such as having a new sexual partner) need to be reassessed to determine if screening should resume. Some women have medical problems that increase the chance of getting cervical cancer. In these cases, your health care provider may recommend more frequent screening and Pap tests. Colorectal Cancer  This type of cancer can be detected and often prevented.  Routine colorectal cancer screening usually begins at 80 years of age and continues through 80 years of age.  Your health care provider may recommend screening at an earlier age if you have risk factors for colon cancer.  Your health care provider may also recommend using home test kits to check for hidden blood in  the stool.  A small camera at the end of a tube can be used to examine your colon directly (sigmoidoscopy or colonoscopy). This is done to check for the earliest forms of colorectal cancer.  Routine screening usually begins at age 44.  Direct examination of the colon should be repeated every 5-10 years through 80 years of age. However, you may need to be screened more often if early forms of precancerous polyps or small growths are found. Skin Cancer  Check your skin from head to toe regularly.  Tell your health care provider about any new moles or changes in moles, especially if there is a change in a mole's shape or color.  Also tell your health care provider if you have a mole that is larger than the size of a pencil eraser.  Always use sunscreen. Apply sunscreen liberally and repeatedly throughout the day.  Protect yourself by wearing long sleeves, pants, a wide-brimmed hat, and sunglasses  whenever you are outside. HEART DISEASE, DIABETES, AND HIGH BLOOD PRESSURE   High blood pressure causes heart disease and increases the risk of stroke. High blood pressure is more likely to develop in:  People who have blood pressure in the high end of the normal range (130-139/85-89 mm Hg).  People who are overweight or obese.  People who are African American.  If you are 58-2 years of age, have your blood pressure checked every 3-5 years. If you are 80 years of age or older, have your blood pressure checked every year. You should have your blood pressure measured twice--once when you are at a hospital or clinic, and once when you are not at a hospital or clinic. Record the average of the two measurements. To check your blood pressure when you are not at a hospital or clinic, you can use:  An automated blood pressure machine at a pharmacy.  A home blood pressure monitor.  If you are between 55 years and 55 years old, ask your health care provider if you should take aspirin to prevent  strokes.  Have regular diabetes screenings. This involves taking a blood sample to check your fasting blood sugar level.  If you are at a normal weight and have a low risk for diabetes, have this test once every three years after 80 years of age.  If you are overweight and have a high risk for diabetes, consider being tested at a younger age or more often. PREVENTING INFECTION  Hepatitis B  If you have a higher risk for hepatitis B, you should be screened for this virus. You are considered at high risk for hepatitis B if:  You were born in a country where hepatitis B is common. Ask your health care provider which countries are considered high risk.  Your parents were born in a high-risk country, and you have not been immunized against hepatitis B (hepatitis B vaccine).  You have HIV or AIDS.  You use needles to inject street drugs.  You live with someone who has hepatitis B.  You have had sex with someone who has hepatitis B.  You get hemodialysis treatment.  You take certain medicines for conditions, including cancer, organ transplantation, and autoimmune conditions. Hepatitis C  Blood testing is recommended for:  Everyone born from [birth year] through 1963.  Anyone with known risk factors for hepatitis C. Sexually transmitted infections (STIs)  You should be screened for sexually transmitted infections (STIs) including gonorrhea and chlamydia if:  You are sexually active and are younger than 80 years of age.  You are older than 80 years of age and your health care provider tells you that you are at risk for this type of infection.  Your sexual activity has changed since you were last screened and you are at an increased risk for chlamydia or gonorrhea. Ask your health care provider if you are at risk.  If you do not have HIV, but are at risk, it may be recommended that you take a prescription medicine daily to prevent HIV infection. This is called pre-exposure prophylaxis  (PrEP). You are considered at risk if:  You are sexually active and do not regularly use condoms or know the HIV status of your partner(s).  You take drugs by injection.  You are sexually active with a partner who has HIV. Talk with your health care provider about whether you are at high risk of being infected with HIV. If you choose to begin PrEP, you should first be  tested for HIV. You should then be tested every 3 months for as long as you are taking PrEP.  PREGNANCY   If you are premenopausal and you may become pregnant, ask your health care provider about preconception counseling.  If you may become pregnant, take 400 to 800 micrograms (mcg) of folic acid every day.  If you want to prevent pregnancy, talk to your health care provider about birth control (contraception). OSTEOPOROSIS AND MENOPAUSE   Osteoporosis is a disease in which the bones lose minerals and strength with aging. This can result in serious bone fractures. Your risk for osteoporosis can be identified using a bone density scan.  If you are 37 years of age or older, or if you are at risk for osteoporosis and fractures, ask your health care provider if you should be screened.  Ask your health care provider whether you should take a calcium or vitamin D supplement to lower your risk for osteoporosis.  Menopause may have certain physical symptoms and risks.  Hormone replacement therapy may reduce some of these symptoms and risks. Talk to your health care provider about whether hormone replacement therapy is right for you.  HOME CARE INSTRUCTIONS   Schedule regular health, dental, and eye exams.  Stay current with your immunizations.   Do not use any tobacco products including cigarettes, chewing tobacco, or electronic cigarettes.  If you are pregnant, do not drink alcohol.  If you are breastfeeding, limit how much and how often you drink alcohol.  Limit alcohol intake to no more than 1 drink per day for  nonpregnant women. One drink equals 12 ounces of beer, 5 ounces of wine, or 1 ounces of hard liquor.  Do not use street drugs.  Do not share needles.  Ask your health care provider for help if you need support or information about quitting drugs.  Tell your health care provider if you often feel depressed.  Tell your health care provider if you have ever been abused or do not feel safe at home.   This information is not intended to replace advice given to you by your health care provider. Make sure you discuss any questions you have with your health care provider.   Document Released: 12/28/2010 Document Revised: 07/05/2014 Document Reviewed: 05/16/2013 Elsevier Interactive Patient Education [employer].

## 2015-12-08 NOTE — Progress Notes (Signed)
   Subjective:    Patient ID: Whitney Donaldson, female    DOB: 1932-09-17, 82 y.o.   MRN: [id number]  HPI The patient is an 82 YO female coming in for wellness. Some minor complaints which are chronic (see A/P for details).   PMH, [facility], social history reviewed and updated.   Review of Systems  Constitutional: Negative for fever, activity change, appetite change, fatigue and unexpected weight change.  HENT: Negative.   Eyes: Negative.   Respiratory: Negative for cough, chest tightness and shortness of breath.   Cardiovascular: Negative for chest pain, palpitations and leg swelling.  Gastrointestinal: Negative for abdominal pain, diarrhea, constipation and abdominal distention.  Musculoskeletal: Negative.   Skin: Negative.   Neurological: Negative.   Psychiatric/Behavioral: Negative.       Objective:   Physical Exam  Constitutional: She is oriented to person, place, and time. She appears well-developed and well-nourished.  HENT:  Head: Normocephalic and atraumatic.  Eyes: EOM are normal.  Neck: Normal range of motion.  Cardiovascular: Normal rate and regular rhythm.   Negative for carotid bruit  Pulmonary/Chest: Effort normal and breath sounds normal. No respiratory distress. She has no wheezes.  Abdominal: Soft. Bowel sounds are normal. She exhibits no distension. There is no tenderness. There is no rebound.  Musculoskeletal: She exhibits no edema.  Neurological: She is alert and oriented to person, place, and time. Coordination normal.  Skin: Skin is warm and dry.  Psychiatric: She has a normal mood and affect.   Filed Vitals:   12/08/15 1005  BP: 116/70  Pulse: 92  Temp: 98.3 F (36.8 C)  TempSrc: Oral  Height: 5\' 5"  (1.651 m)  Weight: 104 lb (47.174 kg)  SpO2: 98%      Assessment & Plan:

## 2015-12-18 ENCOUNTER — Ambulatory Visit (HOSPITAL_BASED_OUTPATIENT_CLINIC_OR_DEPARTMENT_OTHER): Payer: Medicare HMO | Admitting: Hematology & Oncology

## 2015-12-18 ENCOUNTER — Other Ambulatory Visit (HOSPITAL_BASED_OUTPATIENT_CLINIC_OR_DEPARTMENT_OTHER): Payer: Medicare HMO

## 2015-12-18 VITALS — BP 111/51 | HR 87 | Temp 97.7°F | Resp 18 | Wt 104.0 lb

## 2015-12-18 DIAGNOSIS — Z853 Personal history of malignant neoplasm of breast: Secondary | ICD-10-CM

## 2015-12-18 DIAGNOSIS — M8580 Other specified disorders of bone density and structure, unspecified site: Secondary | ICD-10-CM

## 2015-12-18 DIAGNOSIS — E559 Vitamin D deficiency, unspecified: Secondary | ICD-10-CM | POA: Diagnosis not present

## 2015-12-18 DIAGNOSIS — C50912 Malignant neoplasm of unspecified site of left female breast: Secondary | ICD-10-CM

## 2015-12-18 LAB — CBC WITH DIFFERENTIAL (CANCER CENTER ONLY)
BASO#: 0 10*3/uL (ref 0.0–0.2)
BASO%: 0.5 % (ref 0.0–2.0)
EOS ABS: 0 10*3/uL (ref 0.0–0.5)
EOS%: 0 % (ref 0.0–7.0)
HCT: 40.9 % (ref 34.8–46.6)
HEMOGLOBIN: 13.7 g/dL (ref 11.6–15.9)
LYMPH#: 0.8 10*3/uL — ABNORMAL LOW (ref 0.9–3.3)
LYMPH%: 19 % (ref 14.0–48.0)
MCH: 31.9 pg (ref 26.0–34.0)
MCHC: 33.5 g/dL (ref 32.0–36.0)
MCV: 95 fL (ref 81–101)
MONO#: 0.5 10*3/uL (ref 0.1–0.9)
MONO%: 11.5 % (ref 0.0–13.0)
NEUT%: 69 % (ref 39.6–80.0)
NEUTROS ABS: 3.1 10*3/uL (ref 1.5–6.5)
Platelets: 154 10*3/uL (ref 145–400)
RBC: 4.29 10*6/uL (ref 3.70–5.32)
RDW: 12 % (ref 11.1–15.7)
WBC: 4.4 10*3/uL (ref 3.9–10.0)

## 2015-12-18 LAB — COMPREHENSIVE METABOLIC PANEL
ALBUMIN: 4.1 g/dL (ref 3.5–5.0)
ALK PHOS: 56 U/L (ref 40–150)
ALT: 22 U/L (ref 0–55)
ANION GAP: 8 meq/L (ref 3–11)
AST: 18 U/L (ref 5–34)
BILIRUBIN TOTAL: 0.6 mg/dL (ref 0.20–1.20)
BUN: 20.9 mg/dL (ref 7.0–26.0)
CALCIUM: 9.3 mg/dL (ref 8.4–10.4)
CO2: 29 mEq/L (ref 22–29)
Chloride: 106 mEq/L (ref 98–109)
Creatinine: 0.8 mg/dL (ref 0.6–1.1)
EGFR: 69 mL/min/{1.73_m2} — AB (ref >90)
GLUCOSE: 88 mg/dL (ref 70–140)
Potassium: 4 mEq/L (ref 3.5–5.1)
Sodium: 142 mEq/L (ref 136–145)
TOTAL PROTEIN: 7 g/dL (ref 6.4–8.3)
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
## 2015-12-19 LAB — VITAMIN D 25 HYDROXY (VIT D DEFICIENCY, FRACTURES): VIT D 25 HYDROXY: 47.4 ng/mL (ref 30.0–100.0)

## 2016-01-19 ENCOUNTER — Other Ambulatory Visit (INDEPENDENT_AMBULATORY_CARE_PROVIDER_SITE_OTHER): Payer: Medicare HMO

## 2016-01-19 ENCOUNTER — Encounter: Payer: Self-pay | Admitting: Internal Medicine

## 2016-01-19 ENCOUNTER — Ambulatory Visit (INDEPENDENT_AMBULATORY_CARE_PROVIDER_SITE_OTHER): Payer: Medicare HMO | Admitting: Internal Medicine

## 2016-01-19 VITALS — BP 132/78 | HR 78 | Temp 97.9°F | Resp 12 | Ht 65.0 in | Wt 104.0 lb

## 2016-01-19 DIAGNOSIS — IMO0001 Reserved for inherently not codable concepts without codable children: Secondary | ICD-10-CM

## 2016-01-19 DIAGNOSIS — R350 Frequency of micturition: Secondary | ICD-10-CM

## 2016-01-19 DIAGNOSIS — R109 Unspecified abdominal pain: Secondary | ICD-10-CM | POA: Insufficient documentation

## 2016-01-19 DIAGNOSIS — K30 Functional dyspepsia: Secondary | ICD-10-CM

## 2016-01-19 LAB — URINALYSIS, ROUTINE W REFLEX MICROSCOPIC
BILIRUBIN URINE: NEGATIVE
Hgb urine dipstick: NEGATIVE
Ketones, ur: NEGATIVE
LEUKOCYTES UA: NEGATIVE
NITRITE: NEGATIVE
RBC / HPF: NONE SEEN (ref 0–?)
Specific Gravity, Urine: 1.025 (ref 1.000–1.030)
URINE GLUCOSE: NEGATIVE
UROBILINOGEN UA: 0.2 (ref 0.0–1.0)
pH: 6 (ref 5.0–8.0)

## 2016-01-19 MED ORDER — ROSUVASTATIN CALCIUM 20 MG PO TABS: ORAL_TABLET | ORAL | 3 refills | Status: DC

## 2016-01-19 NOTE — Assessment & Plan Note (Signed)
She is likely having some GERD based on her symptoms and relief with tums. She will use tums prn and given information about food that will help avoid symptoms. Checking U/A to rule out infection.

## 2016-01-19 NOTE — Progress Notes (Signed)
   Subjective:    Patient ID: Whitney Donaldson, female    DOB: 16-Apr-1933, 82 y.o.   MRN: [id number]  HPI The patient is an 82 YO female coming in for stomach pain which is new in the last 2 weeks. She is not able to describe it well. She is having discomfort in the top middle of the stomach. Not associated with eating or not eating. No diarrhea or constipation. Some frequency of urination which is new in the last 2 weeks. Overall the symptoms are stable. She does take ibuprofen rarely for her back which did not help with the pain. No fevers or chills. No sick contacts. Admits to starting back to drinking coke prior to the symptoms as well as increasing stress at home. Denies blood in her stool. No gas or bloating with the symptoms. Not relieved with defecation.   Review of Systems  Constitutional: Negative for activity change, appetite change, fatigue, fever and unexpected weight change.  HENT: Negative.   Eyes: Negative.   Respiratory: Negative for cough, chest tightness and shortness of breath.   Cardiovascular: Negative for chest pain, palpitations and leg swelling.  Gastrointestinal: Positive for abdominal pain. Negative for abdominal distention, blood in stool, constipation, diarrhea, nausea and vomiting.  Musculoskeletal: Negative.   Skin: Negative.   Neurological: Negative.   Psychiatric/Behavioral: Negative.       Objective:   Physical Exam  Constitutional: She is oriented to person, place, and time. She appears well-developed and well-nourished.  HENT:  Head: Normocephalic and atraumatic.  Eyes: EOM are normal.  Neck: Normal range of motion.  Cardiovascular: Normal rate and regular rhythm.   Pulmonary/Chest: Effort normal and breath sounds normal. No respiratory distress. She has no wheezes.  Abdominal: Soft. Bowel sounds are normal. She exhibits no distension and no mass. There is no tenderness. There is no rebound and no guarding.  Musculoskeletal: She exhibits no edema.    Neurological: She is alert and oriented to person, place, and time. Coordination normal.  Skin: Skin is warm and dry.  Psychiatric: She has a normal mood and affect.   Vitals:   01/19/16 1028  BP: 132/78  Pulse: 78  Resp: 12  Temp: 97.9 F (36.6 C)  TempSrc: Oral  SpO2: 97%  Weight: 104 lb (47.2 kg)  Height: 5\' 5"  (1.651 m)      Assessment & Plan:

## 2016-01-19 NOTE — Progress Notes (Signed)
Pre visit review using our clinic review tool, if applicable. No additional management support is needed unless otherwise documented below in the visit note. 

## 2016-01-19 NOTE — Patient Instructions (Addendum)
It is okay to use the tums for the pain in the stomach. I have given you information about foods that are better or worse for the acid in the stomach.   Food Choices for Gastroesophageal Reflux Disease, Adult When you have gastroesophageal reflux disease (GERD), the foods you eat and your eating habits are very important. Choosing the right foods can help ease the discomfort of GERD. WHAT GENERAL GUIDELINES DO I NEED TO FOLLOW?  Choose fruits, vegetables, whole grains, low-fat dairy products, and low-fat meat, fish, and poultry.  Limit fats such as oils, salad dressings, butter, nuts, and avocado.  Keep a food diary to identify foods that cause symptoms.  Avoid foods that cause reflux. These may be different for different people.  Eat frequent small meals instead of three large meals each day.  Eat your meals slowly, in a relaxed setting.  Limit fried foods.  Cook foods using methods other than frying.  Avoid drinking alcohol.  Avoid drinking large amounts of liquids with your meals.  Avoid bending over or lying down until 2-3 hours after eating. WHAT FOODS ARE NOT RECOMMENDED? The following are some foods and drinks that may worsen your symptoms: Vegetables Tomatoes. Tomato juice. Tomato and spaghetti sauce. Chili peppers. Onion and garlic. Horseradish. Fruits Oranges, grapefruit, and lemon (fruit and juice). Meats High-fat meats, fish, and poultry. This includes hot dogs, ribs, ham, sausage, salami, and bacon. Dairy Whole milk and chocolate milk. Sour cream. Cream. Butter. Ice cream. Cream cheese.  Beverages Coffee and tea, with or without caffeine. Carbonated beverages or energy drinks. Condiments Hot sauce. Barbecue sauce.  Sweets/Desserts Chocolate and cocoa. Donuts. Peppermint and spearmint. Fats and Oils High-fat foods, including [place] fries and potato chips. Other Vinegar. Strong spices, such as black pepper, white pepper, red pepper, cayenne, curry powder,  cloves, ginger, and chili powder. The items listed above may not be a complete list of foods and beverages to avoid. Contact your dietitian for more information.   This information is not intended to replace advice given to you by your health care provider. Make sure you discuss any questions you have with your health care provider.   Document Released: 06/14/2005 Document Revised: 07/05/2014 Document Reviewed: 04/18/2013 Elsevier Interactive Patient Education [employer].

## 2016-01-29 ENCOUNTER — Telehealth: Payer: Self-pay | Admitting: *Deleted

## 2016-01-29 ENCOUNTER — Ambulatory Visit (INDEPENDENT_AMBULATORY_CARE_PROVIDER_SITE_OTHER): Payer: Medicare HMO

## 2016-01-29 ENCOUNTER — Ambulatory Visit (INDEPENDENT_AMBULATORY_CARE_PROVIDER_SITE_OTHER): Payer: Medicare HMO | Admitting: Emergency Medicine

## 2016-01-29 ENCOUNTER — Ambulatory Visit (HOSPITAL_COMMUNITY)
Admission: RE | Admit: 2016-01-29 | Discharge: 2016-01-29 | Disposition: A | Discharge status: Home / Self Care | Payer: Medicare HMO | Source: Ambulatory Visit | Attending: Emergency Medicine | Admitting: Emergency Medicine

## 2016-01-29 ENCOUNTER — Other Ambulatory Visit: Payer: Self-pay | Admitting: *Deleted

## 2016-01-29 DIAGNOSIS — R1030 Lower abdominal pain, unspecified: Secondary | ICD-10-CM | POA: Diagnosis not present

## 2016-01-29 DIAGNOSIS — R1032 Left lower quadrant pain: Secondary | ICD-10-CM | POA: Diagnosis not present

## 2016-01-29 DIAGNOSIS — N200 Calculus of kidney: Secondary | ICD-10-CM | POA: Insufficient documentation

## 2016-01-29 DIAGNOSIS — N135 Crossing vessel and stricture of ureter without hydronephrosis: Secondary | ICD-10-CM

## 2016-01-29 DIAGNOSIS — I700 Atherosclerosis of aorta: Secondary | ICD-10-CM | POA: Diagnosis not present

## 2016-01-29 LAB — POCT CBC
GRANULOCYTE PERCENT: 69.9 % (ref 37–80)
HEMATOCRIT: 41.1 % (ref 37.7–47.9)
HEMOGLOBIN: 14.1 g/dL (ref 12.2–16.2)
Lymph, poc: 1.2 (ref 0.6–3.4)
MCH: 31.9 pg — AB (ref 27–31.2)
MCHC: 34.4 g/dL (ref 31.8–35.4)
MCV: 92.8 fL (ref 80–97)
MID (cbc): 0.5 (ref 0–0.9)
MPV: 8.3 fL (ref 0–99.8)
POC GRANULOCYTE: 3.9 (ref 2–6.9)
POC LYMPH PERCENT: 22 %L (ref 10–50)
POC MID %: 8.1 % (ref 0–12)
Platelet Count, POC: 152 10*3/uL (ref 142–424)
RBC: 4.43 M/uL (ref 4.04–5.48)
RDW, POC: 13 %
WBC: 5.6 10*3/uL (ref 4.6–10.2)

## 2016-01-29 LAB — POCT URINALYSIS DIP (MANUAL ENTRY)
BILIRUBIN UA: NEGATIVE
Glucose, UA: NEGATIVE
Leukocytes, UA: NEGATIVE
Nitrite, UA: NEGATIVE
Protein Ur, POC: 30 — AB
RBC UA: NEGATIVE
SPEC GRAV UA: 1.025
Urobilinogen, UA: 0.2
pH, UA: 5.5

## 2016-01-29 LAB — COMPLETE METABOLIC PANEL WITH GFR
ALT: 19 U/L (ref 6–29)
AST: 19 U/L (ref 10–35)
Albumin: 4.4 g/dL (ref 3.6–5.1)
Alkaline Phosphatase: 55 U/L (ref 33–130)
BUN: 25 mg/dL (ref 7–25)
CHLORIDE: 102 mmol/L (ref 98–110)
CO2: 30 mmol/L (ref 20–31)
Calcium: 9.8 mg/dL (ref 8.6–10.4)
Creat: 0.8 mg/dL (ref 0.60–0.88)
GFR, EST AFRICAN AMERICAN: 80 mL/min (ref >=60)
GFR, Est Non African American: 69 mL/min (ref >=60)
Glucose, Bld: 97 mg/dL (ref 65–99)
POTASSIUM: 4.4 mmol/L (ref 3.5–5.3)
SODIUM: 140 mmol/L (ref 135–146)
Total Bilirubin: 0.5 mg/dL (ref 0.2–1.2)
Total Protein: 6.9 g/dL (ref 6.1–8.1)

## 2016-01-29 LAB — POC MICROSCOPIC URINALYSIS (UMFC): Mucus: ABSENT

## 2016-01-29 LAB — LIPASE: Lipase: 16 U/L (ref 7–60)

## 2016-01-29 MED ORDER — IOPAMIDOL (ISOVUE-300) INJECTION 61%
100.0000 mL | Freq: Once | INTRAVENOUS | Status: AC | PRN
  Administered 2016-01-29: 80 mL via INTRAVENOUS

## 2016-01-29 NOTE — Telephone Encounter (Signed)
Pt.notified

## 2016-01-29 NOTE — Progress Notes (Signed)
Patient ID: Whitney Donaldson, female   DOB: 08/20/1932, 82 y.o.   MRN: [id number]    By signing my name below I, Tereasa Coop, attest that this documentation has been prepared under the direction and in the presence of Arlyss Queen, MD. Electonically Signed. Tereasa Coop, Scribe 01/29/2016 at 10:25 AM   Chief Complaint:  Chief Complaint  Patient presents with  . Abdominal Pain    HPI: Whitney Donaldson is a 82 y.o. female who reports to [facility] today complaining of lower abd pain for the past 3 weeks. Pt see and evaluated by Dr Sharlet Salina on (01/19/16) for abd pain and told the pt that the pain is likely due to GERD. Pt was not started on any medication. Pain is intermittent and is described as "stomach knots". Pt reports having on average 3 episodes of abd pain a day. Episodes last 10 minutes to an hour. Pt reports regular BMs everyday. Pt states her BMs are small. Pt denies any N/V/D, bloating, or blood in stool.   Pt had her last physical in June 2017 and had blood work done and was told everything was normal. Pt also had urinalysis done a week ago with Dr Sharlet Salina that was negative.   Pt denies any abd surgeries. Pt has been keeping regular colonoscopies.  Pt has history of breast cancer in 2002.   Past Medical History:  Diagnosis Date  . Arthritis   . Cancer ([place])    breast hx of, skin hx of  . Nephrolithiasis     X1  . Osteopenia    Past Surgical History:  Procedure Laterality Date  . COLONOSCOPY  2004    negative; Dr Fuller Plan  . colonoscopy with polypectomy  2014  . mastestomy  2003   partial /left  . SKIN CANCER EXCISION     chest , Dr Ubaldo Glassing   Social History   Social History  . Marital status: Married    Spouse name: N/A  . Number of children: N/A  . Years of education: N/A   Social History Main Topics  . Smoking status: Never Smoker  . Smokeless tobacco: Never Used     Comment: NEVER USED TOBACCO  . Alcohol use No  . Drug use: No  . Sexual activity: Not Asked    Other Topics Concern  . None   Social History Narrative  . None   Family History  Problem Relation Age of Onset  . Heart attack Mother 46  . Lung cancer Father     smoker  . Stroke Sister 20  . Alzheimer's disease Sister   . Heart attack Brother 22  . Diabetes Neg Hx    Allergies  Allergen Reactions  . Oxycodone-Acetaminophen     REACTION: nausea   Prior to Admission medications   Medication Sig Start Date End Date Taking? Authorizing Provider  aspirin 81 MG tablet Take 81 mg by mouth daily.     Yes Historical Provider, MD  Calcium Carbonate-Vitamin D (CALCIUM + D PO) Take by mouth daily.    Yes Historical Provider, MD  Cholecalciferol (VITAMIN D3) 1000 UNITS CAPS Take by mouth 2 (two) times daily.    Yes Historical Provider, MD  cyanocobalamin 500 MCG tablet Take 500 mcg by mouth daily. Take 2 pills daily   Yes Historical Provider, MD  DHA-EPA-Coenzyme Q10-Vitamin E (COQ-10 & FISH OIL PO) Take by mouth.     Yes Historical Provider, MD  FOLIC ACID PO Take [id number] mg by mouth daily.  Yes Historical Provider, MD  LORazepam (ATIVAN) 0.5 MG tablet TAKE 1 TABLET BY MOUTH EVERY 8 HOURS AS NEEDED 12/08/15  Yes Hoyt Koch, MD  Multiple Vitamin (MULTIVITAMIN) tablet Take 1 tablet by mouth daily.     Yes Historical Provider, MD  rosuvastatin (CRESTOR) 20 MG tablet Take 1 tablet by mouth at bedtime 01/19/16 02/12/17 Yes Hoyt Koch, MD  vitamin C (ASCORBIC ACID) 250 MG tablet Take 500 mg by mouth daily.    Yes Historical Provider, MD  Vitamin E 400 UNITS TABS Take by mouth daily.   Yes Historical Provider, MD     ROS: The patient denies fevers, chills, night sweats, unintentional weight loss, chest pain, palpitations, wheezing, dyspnea on exertion, nausea, vomiting, diarrhea, blood in stool, dysuria, hematuria, melena, numbness, weakness, or tingling. Pt is positive for abd pain.  All other systems have been reviewed and were otherwise negative with the exception of  those mentioned in the HPI and as above.    PHYSICAL EXAM: Vitals:   01/29/16 0923  BP: 110/68  Pulse: 88  Resp: 18  Temp: 97.7 F (36.5 C)   Body mass index is 17.14 kg/m.   General: Alert, no acute distress HEENT:  Normocephalic, atraumatic, oropharynx patent. Eye: Juliette Mangle [facility] Cardiovascular:  Regular rate and rhythm, no rubs murmurs or gallops.  No Carotid bruits, radial pulse intact. No pedal edema.  Respiratory: Clear to auscultation bilaterally.  No wheezes, rales, or rhonchi.  No cyanosis, no use of accessory musculature Abdominal: No organomegaly, abdomen is soft. Pt has hyperactive bowel sounds. No masses. Pt has tenderness in her left middle abd. No Guarding no rebound. Musculoskeletal: Gait intact. No edema, tenderness Skin: No rashes. Neurologic: Facial musculature symmetric. Psychiatric: Patient acts appropriately throughout our interaction. Lymphatic: No cervical or submandibular lymphadenopathy    LABS: Results for orders placed or performed in visit on 01/29/16  POCT CBC  Result Value Ref Range   WBC 5.6 4.6 - 10.2 K/uL   Lymph, poc 1.2 0.6 - 3.4   POC LYMPH PERCENT 22.0 10 - 50 %L   MID (cbc) 0.5 0 - 0.9   POC MID % 8.1 0 - 12 %M   POC Granulocyte 3.9 2 - 6.9   Granulocyte percent 69.9 37 - 80 %G   RBC 4.43 4.04 - 5.48 M/uL   Hemoglobin 14.1 12.2 - 16.2 g/dL   HCT, POC 41.1 37.7 - 47.9 %   MCV 92.8 80 - 97 fL   MCH, POC 31.9 (A) 27 - 31.2 pg   MCHC 34.4 31.8 - 35.4 g/dL   RDW, POC 13.0 %   Platelet Count, POC 152 142 - 424 K/uL   MPV 8.3 0 - 99.8 fL  POCT urinalysis dipstick  Result Value Ref Range   Color, UA yellow yellow   Clarity, UA clear clear   Glucose, UA negative negative   Bilirubin, UA negative negative   Ketones, POC UA trace (5) (A) negative   Spec Grav, UA 1.025    Blood, UA negative negative   pH, UA 5.5    Protein Ur, POC =30 (A) negative   Urobilinogen, UA 0.2    Nitrite, UA Negative Negative   Leukocytes, UA Negative  Negative      EKG/XRAY:   Primary read interpreted by Dr. Everlene Farrier at [facility]. Dg Abd Acute W/chest  Result Date: 01/29/2016 CLINICAL DATA:  Lower abdominal pain for 3 weeks. EXAM: DG ABDOMEN ACUTE W/ 1V CHEST COMPARISON:  Radiographs of May 08, 2015. FINDINGS: There is no evidence of dilated bowel loops or free intraperitoneal air. No radiopaque calculi or other significant radiographic abnormality is seen. Heart size and mediastinal contours are within normal limits. Stable left basilar scarring or atelectasis is noted. Otherwise lungs are clear. IMPRESSION: There is no evidence of bowel obstruction or ileus. Stable left basilar scarring or subsegmental atelectasis. Electronically Signed   By: Marijo Conception, M.D.   On: 01/29/2016 10:15     ASSESSMENT/PLAN: Patient having colicky type abdominal pain. We'll proceed with a CT abdomen pelvis.we'll see if we can get this done.I personally performed the services described in this documentation, which was scribed in my presence. The recorded information has been reviewed and is accurate.CT scan was unremarkable except for mild dilatation of the right ureter which appears to be chronic. Referral made to urology for this. She will treat her GI symptoms with  Metamucil. There was no stone seen at th  On CT scanning.     Gross sideeffects, risk and benefits, and alternatives of medications d/w patient. Patient is aware that all medications have potential sideeffects and we are unable to predict every sideeffect or drug-drug interaction that may occur.  Arlyss Queen MD 01/29/2016 9:32 AM

## 2016-01-29 NOTE — Patient Instructions (Addendum)
     IF you received an x-ray today, you will receive an invoice from [facility] Radiology. Please contact [facility] Radiology at [phone] with questions or concerns regarding your invoice.   IF you received labwork today, you will receive an invoice from [employer]. Please contact [facility] at [phone] with questions or concerns regarding your invoice.   Our billing staff will not be able to assist you with questions regarding bills from these companies.  You will be contacted with the lab results as soon as they are available. The fastest way to get your results is to activate your My Chart account. Instructions are located on the last page of this paperwork. If you have not heard from [place] regarding the results in 2 weeks, please contact this office.   You are scheduled at [place] for CT scan @ 12pm You will drink contrast upon arrival

## 2016-01-29 NOTE — Telephone Encounter (Signed)
Per Dr. Everlene Farrier CT scan show partial obstruction of right kidney, which could possibly be causing the pain.  Would recommend to see a urologist.  He also recommend she get some Metamucil or Citrucil.  LM with husband to call back.

## 2016-02-03 DIAGNOSIS — N200 Calculus of kidney: Secondary | ICD-10-CM | POA: Diagnosis not present

## 2016-02-26 DIAGNOSIS — N200 Calculus of kidney: Secondary | ICD-10-CM | POA: Diagnosis not present

## 2016-03-18 ENCOUNTER — Ambulatory Visit (INDEPENDENT_AMBULATORY_CARE_PROVIDER_SITE_OTHER): Payer: Medicare HMO | Admitting: Family Medicine

## 2016-03-18 ENCOUNTER — Ambulatory Visit (INDEPENDENT_AMBULATORY_CARE_PROVIDER_SITE_OTHER): Payer: Medicare HMO

## 2016-03-18 DIAGNOSIS — N200 Calculus of kidney: Secondary | ICD-10-CM

## 2016-03-18 DIAGNOSIS — R109 Unspecified abdominal pain: Secondary | ICD-10-CM

## 2016-03-18 DIAGNOSIS — N135 Crossing vessel and stricture of ureter without hydronephrosis: Secondary | ICD-10-CM | POA: Diagnosis not present

## 2016-03-18 DIAGNOSIS — N2889 Other specified disorders of kidney and ureter: Secondary | ICD-10-CM | POA: Diagnosis not present

## 2016-03-18 NOTE — Patient Instructions (Signed)
I am referring you to [facility].  They will contact you for an appointment and discuss about your kidney stone.  I think the back pain is from a muscle spasm.  Do the exercises as we discussed.

## 2016-03-18 NOTE — Progress Notes (Signed)
Whitney Donaldson is a 82 y.o. female who presents to [facility] today for left-sided flank pain. The patient presented today with her husband. Initially she was just accompanying her husband but after I finished my visit with him she asked if she could also be seen. She was having some flank pain and was concerned that this was another episode of nephrolithiasis or that her current nephrolithiasis is causing her kidney pain.  1.  Slight pain: Patient has had left flank pain for the past weeks.. She states this is colicky pain which comes and goes every few hours. She has history of nephrolithiasis on the right. She sees a Dealer in [facility]. There undergoing a trial of watchful waiting to see she can pass this. Based on her description it sounds like the stone is at the ureteropelvic junction on the right. Basilar artery of her records she does have a CT abdomen which revealed uteropelvic junction obstruction on the right-hand side back in the end of August.  Currently she is in 4-5 out of 10 of pain. She occasionally takes ibuprofen for relief. She's had no fevers or chills. She is able to eat and drink well. Sitting also helps relieve her pain. Worse with prolonged standing.No nausea vomiting. No lower extremity pain or numbness. No bladder or bowel incontinence. No saddle anesthesia.  ROS: as above.    PMH reviewed. Patient is a nonsmoker.   Past Medical History:  Diagnosis Date  . Arthritis   . Cancer ([place])    breast hx of, skin hx of  . Nephrolithiasis     X1  . Osteopenia    Past Surgical History:  Procedure Laterality Date  . COLONOSCOPY  2004    negative; Dr Fuller Plan  . colonoscopy with polypectomy  2014  . mastestomy  2003   partial /left  . SKIN CANCER EXCISION     chest , Dr Ubaldo Glassing    Medications reviewed. Current Outpatient Prescriptions  Medication Sig Dispense Refill  . aspirin 81 MG tablet Take 81 mg by mouth daily.      . Calcium  Carbonate-Vitamin D (CALCIUM + D PO) Take by mouth daily.     . Cholecalciferol (VITAMIN D3) 1000 UNITS CAPS Take by mouth 2 (two) times daily.     . cyanocobalamin 500 MCG tablet Take 500 mcg by mouth daily. Take 2 pills daily    . DHA-EPA-Coenzyme Q10-Vitamin E (COQ-10 & FISH OIL PO) Take by mouth.      . FOLIC ACID PO Take [id number] mg by mouth daily.     Marland Kitchen LORazepam (ATIVAN) 0.5 MG tablet TAKE 1 TABLET BY MOUTH EVERY 8 HOURS AS NEEDED 30 tablet 0  . Multiple Vitamin (MULTIVITAMIN) tablet Take 1 tablet by mouth daily.      . rosuvastatin (CRESTOR) 20 MG tablet Take 1 tablet by mouth at bedtime 90 tablet 3  . vitamin C (ASCORBIC ACID) 250 MG tablet Take 500 mg by mouth daily.     . Vitamin E 400 UNITS TABS Take by mouth daily.     No current facility-administered medications for this visit.      Physical Exam:  There were no vitals taken for this visit. Gen:  Alert, cooperative patient who appears stated age in no acute distress.  Vital signs reviewed. HEENT: EOMI,  MMM Pulm:  Clear to auscultation bilaterally with good air movement.  No wheezes or rales noted.   Cardiac:  Regular rate and rhythm  without murmur auscultated.  Good S1/S2. Abd:  Soft/NT Back:  Normal skin, Spine with normal alignment and no deformity.  No tenderness to vertebral process palpation.  Paraspinous muscles are somewhat tender with noticeable spasm on the Right lumbar region.   Range of motion is full at neck and decreased forward flexion umbar sacral regions.  Straight leg raise is minimally positive on Left.   Neuro:  Sensation and motor function 5/5 bilateral lower extremities.  Patellar and Achilles  DTR's +2 patellar BL.      Assessment and Plan:  1.  Musculoskeletal back pain: -Secondary to muscle spasm on the left. -I reviewed her KUB in clinic. This shows at least a mild degree of scoliosis. Unclear if this is congenital or if this is developed over time secondary to osteoporosis. -I think this is the cause  of her left-sided lumbago/muscle pain. -I gave her some physical therapy exercises to try for relief. Also heat alternating with ice. Continue over-the-counter analgesics as needed. - Reassured her that her current right-sided nephrolithiasis is not causing this left-sided back pain.  #2. Ongoing nephrolithiasis: -She is undergoing. Of watchful waiting with urologist in [facility]. -She has not seen passage of any stone. -She does not really have any pain on the right-hand side. However she would like to be evaluated for second opinion by a urologist here in [place] as all of her records are here in [place]. -There was some degree of obstruction on CT abdomen done at the end of August. At this point I'll refer her for second opinion as to management of her nephrolithiasis to [facility].

## 2016-03-31 DIAGNOSIS — H353132 Nonexudative age-related macular degeneration, bilateral, intermediate dry stage: Secondary | ICD-10-CM | POA: Diagnosis not present

## 2016-03-31 DIAGNOSIS — H35341 Macular cyst, hole, or pseudohole, right eye: Secondary | ICD-10-CM | POA: Diagnosis not present

## 2016-04-02 DIAGNOSIS — N200 Calculus of kidney: Secondary | ICD-10-CM | POA: Diagnosis not present

## 2016-04-14 DIAGNOSIS — R69 Illness, unspecified: Secondary | ICD-10-CM | POA: Diagnosis not present

## 2016-04-22 ENCOUNTER — Encounter: Payer: Self-pay | Admitting: Geriatric Medicine

## 2016-04-22 ENCOUNTER — Ambulatory Visit (INDEPENDENT_AMBULATORY_CARE_PROVIDER_SITE_OTHER): Payer: Medicare HMO | Admitting: Family Medicine

## 2016-04-22 VITALS — BP 104/60 | HR 94 | Temp 98.2°F | Resp 16 | Ht 62.5 in | Wt 104.6 lb

## 2016-04-22 DIAGNOSIS — J069 Acute upper respiratory infection, unspecified: Secondary | ICD-10-CM

## 2016-04-22 MED ORDER — AZITHROMYCIN 250 MG PO TABS: ORAL_TABLET | ORAL | 0 refills | Status: DC

## 2016-04-22 NOTE — Patient Instructions (Addendum)
It was good to see you today!  I have sent in your antibiotics today.  I will also place the referral.  Feel better, it may take a few days to shake the cough.      IF you received an x-ray today, you will receive an invoice from [facility]. Please contact [facility]iology at [phone] with questions or concerns regarding your invoice.   IF you received labwork today, you will receive an invoice from [employer]. Please contact [facility] at [phone] with questions or concerns regarding your invoice.   Our billing staff will not be able to assist you with questions regarding bills from these companies.  You will be contacted with the lab results as soon as they are available. The fastest way to get your results is to activate your My Chart account. Instructions are located on the last page of this paperwork. If you have not heard from [place] regarding the results in 2 weeks, please contact this office.

## 2016-04-22 NOTE — Progress Notes (Signed)
   Whitney Donaldson is a 82 y.o. female who presents to [facility] today for URI symptoms plus cough:  1.  URI symptoms plus cough:  Started about 3 weeks ago.  Initially lasted for about 5 days and then improved for several days.  Now worsening.  With fairly constant cough.  URI symptoms have still persisted.  Subjective fevers and chills at home, hasn't taken tempreature.  Eating and drinking well. No palpitations.   ROS as above.    PMH reviewed. Patient is a nonsmoker.   Past Medical History:  Diagnosis Date  . Arthritis   . Cancer ([place])    breast hx of, skin hx of  . Nephrolithiasis     X1  . Osteopenia    Past Surgical History:  Procedure Laterality Date  . COLONOSCOPY  2004    negative; Dr Fuller Plan  . colonoscopy with polypectomy  2014  . mastestomy  2003   partial /left  . SKIN CANCER EXCISION     chest , Dr Ubaldo Glassing    Medications reviewed. Current Outpatient Prescriptions  Medication Sig Dispense Refill  . aspirin 81 MG tablet Take 81 mg by mouth daily.      . Calcium Carbonate-Vitamin D (CALCIUM + D PO) Take by mouth daily.     . Cholecalciferol (VITAMIN D3) 1000 UNITS CAPS Take by mouth 2 (two) times daily.     . cyanocobalamin 500 MCG tablet Take 500 mcg by mouth daily. Take 2 pills daily    . DHA-EPA-Coenzyme Q10-Vitamin E (COQ-10 & FISH OIL PO) Take by mouth.      . FOLIC ACID PO Take [id number] mg by mouth daily.     Marland Kitchen LORazepam (ATIVAN) 0.5 MG tablet TAKE 1 TABLET BY MOUTH EVERY 8 HOURS AS NEEDED 30 tablet 0  . Multiple Vitamin (MULTIVITAMIN) tablet Take 1 tablet by mouth daily.      . rosuvastatin (CRESTOR) 20 MG tablet Take 1 tablet by mouth at bedtime 90 tablet 3  . vitamin C (ASCORBIC ACID) 250 MG tablet Take 500 mg by mouth daily.     . Vitamin E 400 UNITS TABS Take by mouth daily.     No current facility-administered medications for this visit.      Physical Exam:  BP 104/60 (BP Location: Right Arm, Patient Position: Sitting, Cuff Size:  Normal)   Pulse 94   Temp 98.2 F (36.8 C) (Oral)   Resp 16   Ht 5' 2.5" (1.588 m)   Wt 104 lb 9.6 oz (47.4 kg)   SpO2 99%   BMI 18.83 kg/m  Gen:  Patient sitting on exam table, appears stated age in no acute distress Head: Normocephalic atraumatic Eyes: EOMI, PERRL, sclera and conjunctiva non-erythematous Ears:  Canals clear bilaterally.  TMs pearly gray bilaterally without erythema or bulging.   Nose:  Nasal turbinates grossly enlarged bilaterally. Some exudates noted. Tender to palpation of maxillary sinus  Mouth: Mucosa membranes moist. Tonsils +2, nonenlarged, non-erythematous. Neck: No cervical lymphadenopathy noted Heart:  RRR, no murmurs auscultated. Pulm:  Clear to auscultation bilaterally with good air movement.  No wheezes or rales noted. No focal findings.     Assessment and Plan:  1.  URI: With second sickening.  Concern for possibility of developing bronchitis.  Plan treat with azithromycin x 5 days.  . Return if worsening or no improvement in 1 week.

## 2016-05-19 DIAGNOSIS — Z1231 Encounter for screening mammogram for malignant neoplasm of breast: Secondary | ICD-10-CM | POA: Diagnosis not present

## 2016-05-19 DIAGNOSIS — Z853 Personal history of malignant neoplasm of breast: Secondary | ICD-10-CM | POA: Diagnosis not present

## 2016-06-15 ENCOUNTER — Encounter: Payer: Self-pay | Admitting: Internal Medicine

## 2016-10-11 ENCOUNTER — Ambulatory Visit (INDEPENDENT_AMBULATORY_CARE_PROVIDER_SITE_OTHER): Payer: Medicare HMO | Admitting: Physician Assistant

## 2016-10-11 ENCOUNTER — Encounter: Payer: Self-pay | Admitting: Physician Assistant

## 2016-10-11 VITALS — BP 128/67 | HR 95 | Temp 97.8°F | Ht 62.5 in | Wt 106.4 lb

## 2016-10-11 DIAGNOSIS — J069 Acute upper respiratory infection, unspecified: Secondary | ICD-10-CM | POA: Diagnosis not present

## 2016-10-11 LAB — POCT CBC
Granulocyte percent: 76.7 %G (ref 37–80)
HCT, POC: 38.5 % (ref 37.7–47.9)
Hemoglobin: 13 g/dL (ref 12.2–16.2)
Lymph, poc: 1.1 (ref 0.6–3.4)
MCH: 31.6 pg — AB (ref 27–31.2)
MCHC: 33.9 g/dL (ref 31.8–35.4)
MCV: 93.4 fL (ref 80–97)
MID (cbc): 0.3 (ref 0–0.9)
MPV: 8.6 fL (ref 0–99.8)
PLATELET COUNT, POC: 229 10*3/uL (ref 142–424)
POC Granulocyte: 4.7 (ref 2–6.9)
POC LYMPH %: 17.7 % (ref 10–50)
POC MID %: 5.6 %M (ref 0–12)
RBC: 4.12 M/uL (ref 4.04–5.48)
RDW, POC: 11.7 %
WBC: 6.1 10*3/uL (ref 4.6–10.2)

## 2016-10-11 NOTE — Progress Notes (Signed)
10/11/2016 11:21 AM   DOB: [birth year] / MRN: [id number]  SUBJECTIVE:  Whitney Donaldson is a 83 y.o. female presenting for nasal and head congestion that started 5 days ago. Reports a cough and fatigue that started shortly thereafter.  Denies HA, sore throat, ear pain, nasal pain, fever, chills. She has not been taking any medications. No history of asthma and she is a never smoker.   She is allergic to oxycodone-acetaminophen.   She  has a past medical history of Arthritis; Cancer ([place]); Nephrolithiasis; and Osteopenia.    She  reports that she has never smoked. She has never used smokeless tobacco. She reports that she does not drink alcohol or use drugs. She  has no sexual activity history on file. The patient  has a past surgical history that includes mastestomy (2003); Colonoscopy (2004 ); Skin cancer excision; and colonoscopy with polypectomy (2014).  Her family history includes Alzheimer's disease in her sister; Heart attack (age of onset: 91) in her mother; Heart attack (age of onset: 50) in her brother; Lung cancer in her father; Stroke (age of onset: 28) in her sister.  Review of Systems  Constitutional: Negative for chills, diaphoresis and fever.  Eyes: Negative.   Respiratory: Negative for cough, hemoptysis, sputum production, shortness of breath and wheezing.   Cardiovascular: Negative for chest pain, orthopnea and leg swelling.  Gastrointestinal: Negative for nausea.  Skin: Negative for rash.  Neurological: Negative for dizziness, sensory change, speech change, focal weakness and headaches.    The problem list and medications were reviewed and updated by myself where necessary and exist elsewhere in the encounter.   OBJECTIVE:  BP 128/67 (BP Location: Right Arm, Patient Position: Sitting, Cuff Size: Small)   Pulse 95   Temp 97.8 F (36.6 C) (Oral)   Ht 5' 2.5" (1.588 m)   Wt 106 lb 6.4 oz (48.3 kg)   SpO2 98%   BMI 19.15 kg/m   Pulse Readings from Last 3 Encounters:   10/11/16 95  04/22/16 94  01/29/16 88     Physical Exam  Results for orders placed or performed in visit on 10/11/16 (from the past 72 hour(s))  POCT CBC     Status: Abnormal   Collection Time: 10/11/16 11:07 AM  Result Value Ref Range   WBC 6.1 4.6 - 10.2 K/uL   Lymph, poc 1.1 0.6 - 3.4   POC LYMPH PERCENT 17.7 10 - 50 %L   MID (cbc) 0.3 0 - 0.9   POC MID % 5.6 0 - 12 %M   POC Granulocyte 4.7 2 - 6.9   Granulocyte percent 76.7 37 - 80 %G   RBC 4.12 4.04 - 5.48 M/uL   Hemoglobin 13.0 12.2 - 16.2 g/dL   HCT, POC 38.5 37.7 - 47.9 %   MCV 93.4 80 - 97 fL   MCH, POC 31.6 (A) 27 - 31.2 pg   MCHC 33.9 31.8 - 35.4 g/dL   RDW, POC 11.7 %   Platelet Count, POC 229 142 - 424 K/uL   MPV 8.6 0 - 99.8 fL    No results found.  ASSESSMENT AND PLAN:  Sireen was seen today for cough.  Diagnoses and all orders for this visit:  Acute URI: Anticipatory guidance per AVS.  Will try Amox 875 bid x 10 days if she calls back without improvement in the next few days. -     POCT CBC    The patient is advised to call or return to clinic  if she does not see an improvement in symptoms, or to seek the care of the closest emergency department if she worsens with the above plan.   Philis Fendt, MHS, PA-C [facility] 10/11/2016 11:21 AM

## 2016-10-11 NOTE — Patient Instructions (Addendum)
    Take tylenol 1000 mg every 8 hours as needed for pain, fever, and fatigue.    Try Claritin 5-10 mg daily as needed for cough.   Try OTC dextromethoraphan for cough.   If you are not feeling better in the next 4-5 days then please contact me so we can try an antibiotic.   Please drink lots of water.      IF you received an x-ray today, you will receive an invoice from [facility] Radiology. Please contact [facility] Radiology at [phone] with questions or concerns regarding your invoice.   IF you received labwork today, you will receive an invoice from [place]. Please contact [facility] at [phone] with questions or concerns regarding your invoice.   Our billing staff will not be able to assist you with questions regarding bills from these companies.  You will be contacted with the lab results as soon as they are available. The fastest way to get your results is to activate your My Chart account. Instructions are located on the last page of this paperwork. If you have not heard from [place] regarding the results in 2 weeks, please contact this office.

## 2016-10-13 ENCOUNTER — Telehealth: Payer: Self-pay | Admitting: Physician Assistant

## 2016-10-13 NOTE — Telephone Encounter (Signed)
Pt came in saying that she is not getting better except for the cough that she had prev visit. She wanted to see if she can get perscribed an antibiotic w/o having to be seen again. Please Advise.

## 2016-10-14 ENCOUNTER — Other Ambulatory Visit: Payer: Self-pay | Admitting: Physician Assistant

## 2016-10-14 MED ORDER — AMOXICILLIN 875 MG PO TABS: 875.0000 mg | ORAL_TABLET | Freq: Two times a day (BID) | ORAL | 0 refills | Status: DC

## 2016-10-14 NOTE — Telephone Encounter (Signed)
I'm sending to the pharmacy in the system now.  Please let her know. Thank you Angie. Philis Fendt, MS, PA-C 9:36 AM, 10/14/2016

## 2016-10-14 NOTE — Telephone Encounter (Signed)
PT CALLING AGAIN ABOUT RX THIS WAS IN [facility] CLARKS NOTE   Acute URI: Anticipatory guidance per AVS.  Will try Amox 875 bid x 10 days if she calls back without improvement in the next few days

## 2016-11-10 ENCOUNTER — Encounter: Payer: Self-pay | Admitting: Family Medicine

## 2016-11-10 ENCOUNTER — Ambulatory Visit (INDEPENDENT_AMBULATORY_CARE_PROVIDER_SITE_OTHER): Payer: Medicare HMO

## 2016-11-10 ENCOUNTER — Ambulatory Visit (INDEPENDENT_AMBULATORY_CARE_PROVIDER_SITE_OTHER): Payer: Medicare HMO | Admitting: Family Medicine

## 2016-11-10 VITALS — BP 150/74 | HR 88 | Temp 97.9°F | Resp 18 | Ht 62.8 in | Wt 104.8 lb

## 2016-11-10 DIAGNOSIS — M7918 Myalgia, other site: Secondary | ICD-10-CM

## 2016-11-10 DIAGNOSIS — M545 Low back pain: Secondary | ICD-10-CM | POA: Diagnosis not present

## 2016-11-10 DIAGNOSIS — M25552 Pain in left hip: Secondary | ICD-10-CM | POA: Diagnosis not present

## 2016-11-10 DIAGNOSIS — M791 Myalgia: Secondary | ICD-10-CM | POA: Diagnosis not present

## 2016-11-10 DIAGNOSIS — M5432 Sciatica, left side: Secondary | ICD-10-CM | POA: Diagnosis not present

## 2016-11-10 DIAGNOSIS — M62830 Muscle spasm of back: Secondary | ICD-10-CM

## 2016-11-10 DIAGNOSIS — S3992XA Unspecified injury of lower back, initial encounter: Secondary | ICD-10-CM | POA: Diagnosis not present

## 2016-11-10 DIAGNOSIS — S79912A Unspecified injury of left hip, initial encounter: Secondary | ICD-10-CM | POA: Diagnosis not present

## 2016-11-10 MED ORDER — BACLOFEN 10 MG PO TABS: 5.0000 mg | ORAL_TABLET | Freq: Two times a day (BID) | ORAL | 0 refills | Status: DC | PRN

## 2016-11-10 NOTE — Progress Notes (Signed)
Whitney Donaldson is a 83 y.o. female who presents to Primary Care at [facility] today for left buttock pain:  1.  Left buttock pain:  Patient was working in her garden about 1 week ago when she lost her balance and fell onto her buttocks.  Her husband came and helped her up.  She did not have any immediate pain and was able to get up and walk around easily.  Started noticing sharp, stabbing pain in Left buttock area about 2 days later.  She has been using ice and one 200 mg ibuprofen daily for pain relief, but states pain has persisted.  Describes alternating "dull aching" with sharp pain.  Pain can radiate length of Left thigh.  Denies any paresthesias.  No foot pain.  No ankle pain.   Pain worse when climbing stairs or trying to lift something.  Also hurts with prolonged sitting.  States 5/10 in severity at its worst.  Denies any back pain.  Has never injured back that she knows of.  No bladder or bowel incontinence.  No dysuria or other urinary symptoms.   ROS as above.    PMH reviewed. Patient is a nonsmoker.   Past Medical History:  Diagnosis Date  . Arthritis   . Cancer ([place])    breast hx of, skin hx of  . Nephrolithiasis     X1  . Osteopenia    Past Surgical History:  Procedure Laterality Date  . COLONOSCOPY  2004    negative; Dr Fuller Plan  . colonoscopy with polypectomy  2014  . mastestomy  2003   partial /left  . SKIN CANCER EXCISION     chest , Dr Ubaldo Glassing    Medications reviewed. Current Outpatient Prescriptions  Medication Sig Dispense Refill  . aspirin 81 MG tablet Take 81 mg by mouth daily.      Marland Kitchen azithromycin (ZITHROMAX) 250 MG tablet Take 2 tabs today followed by 1 tab po daily after that 6 tablet 0  . Calcium Carbonate-Vitamin D (CALCIUM + D PO) Take by mouth daily.     . Cholecalciferol (VITAMIN D3) 1000 UNITS CAPS Take by mouth 2 (two) times daily.     . cyanocobalamin 500 MCG tablet Take 500 mcg by mouth daily. Take 2 pills daily    . DHA-EPA-Coenzyme Q10-Vitamin E  (COQ-10 & FISH OIL PO) Take by mouth.      . FOLIC ACID PO Take 354 mg by mouth daily.     Marland Kitchen LORazepam (ATIVAN) 0.5 MG tablet TAKE 1 TABLET BY MOUTH EVERY 8 HOURS AS NEEDED 30 tablet 0  . Multiple Vitamin (MULTIVITAMIN) tablet Take 1 tablet by mouth daily.      . rosuvastatin (CRESTOR) 20 MG tablet Take 1 tablet by mouth at bedtime 90 tablet 3  . vitamin C (ASCORBIC ACID) 250 MG tablet Take 500 mg by mouth daily.     . Vitamin E 400 UNITS TABS Take by mouth daily.    Marland Kitchen amoxicillin (AMOXIL) 875 MG tablet Take 1 tablet (875 mg total) by mouth 2 (two) times daily. (Patient not taking: Reported on 11/10/2016) 20 tablet 0   No current facility-administered medications for this visit.      Physical Exam:  BP (!) 162/69 (BP Location: Right Arm, Patient Position: Sitting, Cuff Size: Small)   Pulse 88   Temp 97.9 F (36.6 C) (Oral)   Resp 18   Ht 5' 2.8" (1.595 m)   Wt 104 lb 12.8 oz (47.5 kg)  SpO2 100%   BMI 18.69 kg/m  Gen:  Alert, cooperative patient who appears stated age in no acute distress.  Vital signs reviewed. HEENT: EOMI,  MMM Back:  Normal skin, Spine with normal alignment and no deformity.  No tenderness to vertebral process palpation.  Paraspinous muscles are not tender but with noticeable spasm Left lumbar reiong.   Range of motion is full at neck and decreased forward flexion in lumbar sacral regions.  Straight leg raise is positive on Left.   Hip:  Some pain with external rotation of hip at very end of testing.  No pain Right hip.   Skin:  No bruising noted over areas of tenderness. Some mild varicose veins scattered BL LE's Neuro:  Sensation and motor function 5/5 bilateral lower extremities.  Patellar and Achilles  DTR's +2 patellar BL.   Exts: Non edematous BL  LE, warm and well perfused.   Assessment and Plan:  1.  Back spasm with sciatica: - Impressive spasm on Left side of back.   - Pain radiating to buttocks -- occasionally to leg - Radiographs negative for any  hip fracture or lumbar spinal issues except for known scoliosis to the Left, making spasm more likely - Plan to treat as such.  Due to age and fact she wants to maintain her mobility, plan to refer to physical therapy  - Continue conservative treatments.  FU if no improvement in 2 weeks. Sooner if worsening.

## 2016-11-10 NOTE — Patient Instructions (Addendum)
  It was good to see you again today as always.    You have a bad muscle spasm in your lower back.  This is causing the pain you are feeling.  Take the Ibuprofen 400 mg twice a day if you need it for pain.  Take the Balcofen as a muscle relaxer also twice a day.  This may make you drowsy and if it does do not take it during the day.  Heat and massage are also great to help relieve the pain.  This can last for the next 7-10 days. If you're still having issues in the next 2 weeks come back and see [place]. I am referring you to physical therapy.  They will contact you with an appointment.    ------------------------------------------  For your son, try one of these 2 groups: - [facility]  Address:  [address] [place], [place]  [address]  Phone [phone]  [place]: Address: [address] [place], [place] [address] Phone: [phone]    IF you received an x-ray today, you will receive an invoice from [facility] Radiology. Please contact [facility] Radiology at [phone] with questions or concerns regarding your invoice.   IF you received labwork today, you will receive an invoice from [place]. Please contact [facility] at [phone] with questions or concerns regarding your invoice.   Our billing staff will not be able to assist you with questions regarding bills from these companies.  You will be contacted with the lab results as soon as they are available. The fastest way to get your results is to activate your My Chart account. Instructions are located on the last page of this paperwork. If you have not heard from [place] regarding the results in 2 weeks, please contact this office.

## 2016-11-15 ENCOUNTER — Telehealth: Payer: Self-pay | Admitting: Family Medicine

## 2016-11-15 NOTE — Telephone Encounter (Signed)
Pt advised   Is seeing dr Carlota Raspberry tomorrow

## 2016-11-15 NOTE — Telephone Encounter (Signed)
Pt calling for a RX for muscle spasms

## 2016-11-15 NOTE — Telephone Encounter (Signed)
Is there anything else you can give for spasms?

## 2016-11-15 NOTE — Telephone Encounter (Signed)
PT IS STILL HAVING MUSCLE SPASMS AND THE IBUPROPHEN IS NOT HELPING AND NEEDS TO KNOW WHAT CAN BE DONE NEXT   BEST NUMBER [phone]

## 2016-11-15 NOTE — Telephone Encounter (Signed)
I prescribed her Baclofen at the last appointment for her to take for muscle spasms with the following instructions:  Take 0.5 tablets (5 mg total) by mouth 2 (two) times daily as needed for muscle spasms.  If this isn't helping, she can try a full tablet (10 mg) BID PRN.    Did she pick these up or try taking them?  Thanks!  JW

## 2016-11-16 ENCOUNTER — Encounter: Payer: Self-pay | Admitting: Family Medicine

## 2016-11-16 ENCOUNTER — Ambulatory Visit (HOSPITAL_COMMUNITY)
Admission: RE | Admit: 2016-11-16 | Discharge: 2016-11-16 | Disposition: A | Discharge status: Home / Self Care | Payer: Medicare HMO | Source: Ambulatory Visit | Attending: Family Medicine | Admitting: Family Medicine

## 2016-11-16 ENCOUNTER — Ambulatory Visit (INDEPENDENT_AMBULATORY_CARE_PROVIDER_SITE_OTHER): Payer: Medicare HMO | Admitting: Family Medicine

## 2016-11-16 VITALS — BP 132/77 | HR 90 | Temp 97.8°F | Resp 16 | Ht 62.0 in | Wt 103.0 lb

## 2016-11-16 DIAGNOSIS — W19XXXA Unspecified fall, initial encounter: Secondary | ICD-10-CM | POA: Insufficient documentation

## 2016-11-16 DIAGNOSIS — S3219XA Other fracture of sacrum, initial encounter for closed fracture: Secondary | ICD-10-CM | POA: Diagnosis not present

## 2016-11-16 DIAGNOSIS — S3210XA Unspecified fracture of sacrum, initial encounter for closed fracture: Secondary | ICD-10-CM | POA: Diagnosis not present

## 2016-11-16 DIAGNOSIS — M5441 Lumbago with sciatica, right side: Secondary | ICD-10-CM | POA: Diagnosis not present

## 2016-11-16 DIAGNOSIS — M791 Myalgia: Secondary | ICD-10-CM

## 2016-11-16 DIAGNOSIS — M7918 Myalgia, other site: Secondary | ICD-10-CM

## 2016-11-16 DIAGNOSIS — M5442 Lumbago with sciatica, left side: Secondary | ICD-10-CM | POA: Insufficient documentation

## 2016-11-16 DIAGNOSIS — R69 Illness, unspecified: Secondary | ICD-10-CM | POA: Diagnosis not present

## 2016-11-16 MED ORDER — TRAMADOL HCL 50 MG PO TABS: 50.0000 mg | ORAL_TABLET | Freq: Four times a day (QID) | ORAL | 0 refills | Status: DC | PRN

## 2016-11-16 NOTE — Patient Instructions (Addendum)
Please go to [facility] main entrance for your scheduled CT.  Please arrive at 2:15pm. Please remain there after your scan for further instructions.     I will check a CT scan of the pelvis/bones around the buttock area today to make sure there is not a fracture that was not seen on initial x-rays. For pain, can take tramadol 1 pill every 6 hours as needed. If still not able to achieve pain relief at that dose, let me know and we can change dose or change medication. Be very careful taking tramadol as that can cause sedation or dizziness.   IF you received an x-ray today, you will receive an invoice from [facility] Radiology. Please contact [facility] Radiology at [phone] with questions or concerns regarding your invoice.   IF you received labwork today, you will receive an invoice from [place]. Please contact [facility] at [phone] with questions or concerns regarding your invoice.   Our billing staff will not be able to assist you with questions regarding bills from these companies.  You will be contacted with the lab results as soon as they are available. The fastest way to get your results is to activate your My Chart account. Instructions are located on the last page of this paperwork. If you have not heard from [place] regarding the results in 2 weeks, please contact this office.

## 2016-11-16 NOTE — Progress Notes (Addendum)
By signing my name below, I, Mesha Guinyard, attest that this documentation has been prepared under the direction and in the presence of Merri Ray, MD.  Electronically Signed: Verlee Monte, Medical Scribe. 11/16/16. 12:14 PM.  Subjective:    Patient ID: Whitney Donaldson, female    DOB: [birth year], 83 y.o.   MRN: [id number]  HPI Chief Complaint  Patient presents with  . Spasms    per patient, began appx 2 weeks ago; taking medication, no relief    HPI Comments: Whitney Donaldson is a 83 y.o. female who presents to Primary Care at [facility] complaining of spasms. Was seen by Dr. Mingo Amber on 11/10/16 for pain lumbar spasms after fall on her buttock 1 week prior. She did have some pain from the buttock to her left thigh. Did not complain of any back pain at that time, but on exam she did have spasm of left lower lumbar muscles, positive straight leg raise on the left, and some discomfort at the left hip at terminal external rotation. She had a x-ray of her lumbar spine indicating degenerative without acute abnormality. Left hip x-ray no acute abnormality.  Reports difficulty walking up the stairs due to radiating buttock L>R muscle pain that's described as a pulling in both buttock muscles with radiation down both legs. Associated sxs of sleep disturbance from pain and pain worsens with walking. Pt has to pull herself up the steps often due to pain in her legs - legs occasionally "give away", but no persistent weakness.. Pt has tried blacofen, and ibuprofen every 8 hours (not taken today) without relief of her sxs. She has been taking baclofen 5 mg without relief, and at 4am this morning she took a full pill (10 mg) without relief of her sxs. Pt has not been to PT since her last office visit. Denies bowel/urinary incontinence, saddle anesthesia, and bruising.  Pt is no longer taking lorazepam.  Fall Risk  11/16/2016 11/10/2016 10/11/2016 04/22/2016 01/29/2016  Falls in the past year? Yes Yes No No No   Number falls in past yr: 1 1 - - -  Injury with Fall? Yes (No Data) - - -   Unrelated: Pt reports frequent urination.  Patient Active Problem List   Diagnosis Date Noted  . Stomach pain 01/19/2016  . Routine general medical examination at a health care facility 12/08/2015  . Nonspecific abnormal electrocardiogram (ECG) (EKG) 12/05/2014  . Osteopenia 07/31/2009  . ANXIETY STATE, UNSPECIFIED 07/04/2008  . Hyperlipemia 04/04/2008  . Nocturia 04/04/2008   Past Medical History:  Diagnosis Date  . Arthritis   . Cancer ([place])    breast hx of, skin hx of  . Nephrolithiasis     X1  . Osteopenia    Past Surgical History:  Procedure Laterality Date  . COLONOSCOPY  2004    negative; Dr Fuller Plan  . colonoscopy with polypectomy  2014  . mastestomy  2003   partial /left  . SKIN CANCER EXCISION     chest , Dr Ubaldo Glassing   Allergies  Allergen Reactions  . Oxycodone-Acetaminophen     REACTION: nausea   Prior to Admission medications   Medication Sig Start Date End Date Taking? Authorizing Provider  amoxicillin (AMOXIL) 875 MG tablet Take 1 tablet (875 mg total) by mouth 2 (two) times daily. 10/14/16  Yes Tereasa Coop, PA-C  aspirin 81 MG tablet Take 81 mg by mouth daily.     Yes [provider]  azithromycin (ZITHROMAX) 250 MG tablet Take  2 tabs today followed by 1 tab po daily after that 04/22/16  Yes Alveda Reasons, MD  baclofen (LIORESAL) 10 MG tablet Take 0.5 tablets (5 mg total) by mouth 2 (two) times daily as needed for muscle spasms. 11/10/16  Yes Alveda Reasons, MD  Calcium Carbonate-Vitamin D (CALCIUM + D PO) Take by mouth daily.    Yes [provider]  Cholecalciferol (VITAMIN D3) 1000 UNITS CAPS Take by mouth 2 (two) times daily.    Yes [provider]  cyanocobalamin 500 MCG tablet Take 500 mcg by mouth daily. Take 2 pills daily   Yes [provider]  DHA-EPA-Coenzyme Q10-Vitamin E (COQ-10 & FISH OIL PO) Take by mouth.     Yes [provider]  FOLIC ACID PO Take 287 mg by mouth daily.    Yes [provider]  LORazepam (ATIVAN) 0.5 MG tablet TAKE 1 TABLET BY MOUTH EVERY 8 HOURS AS NEEDED 12/08/15  Yes Hoyt Koch, MD  Multiple Vitamin (MULTIVITAMIN) tablet Take 1 tablet by mouth daily.     Yes [provider]  rosuvastatin (CRESTOR) 20 MG tablet Take 1 tablet by mouth at bedtime 01/19/16 02/12/17 Yes Hoyt Koch, MD  vitamin C (ASCORBIC ACID) 250 MG tablet Take 500 mg by mouth daily.    Yes [provider]  Vitamin E 400 UNITS TABS Take by mouth daily.   Yes [provider]   Social History   Social History  . Marital status: Married    Spouse name: N/A  . Number of children: N/A  . Years of education: N/A   Occupational History  . Not on file.   Social History Main Topics  . Smoking status: Never Smoker  . Smokeless tobacco: Never Used     Comment: NEVER USED TOBACCO  . Alcohol use No  . Drug use: No  . Sexual activity: Not on file   Other Topics Concern  . Not on file   Social History Narrative  . No narrative on file   Review of Systems  Endocrine: Positive for polyuria.  Musculoskeletal: Positive for myalgias.  Skin: Negative for color change.  Neurological: Positive for weakness. Negative for numbness.  Psychiatric/Behavioral: Positive for sleep disturbance.   Objective:  Physical Exam  Constitutional: She appears well-developed and well-nourished. No distress.  HENT:  Head: Normocephalic and atraumatic.  Eyes: Conjunctivae are normal.  Neck: Neck supple.  Cardiovascular: Normal rate.   Pulmonary/Chest: Effort normal.  Musculoskeletal:  Lumbar spine is non tender Tender along the sciatic notch and upper buttock bilaterally No pain with internal or external rotation of left hip Negative seated straight leg raise  Neurological: She is alert. She displays no Babinski's sign on the right side. She displays no Babinski's sign on the  left side.  Reflex Scores:      Patellar reflexes are 2+ on the right side and 2+ on the left side.      Achilles reflexes are 2+ on the right side and 2+ on the left side. Skin: Skin is warm and dry. No bruising noted.  No bruising on the skin of the buttock  Psychiatric: She has a normal mood and affect. Her behavior is normal.  Nursing note and vitals reviewed.   Vitals:   11/16/16 1140 11/16/16 1236  BP: (!) 145/69 132/77  Pulse: 90   Resp: 16   Temp: 97.8 F (36.6 C)   TempSrc: Oral   SpO2: 98%   Weight: 103 lb (  46.7 kg)   Height: 5\' 2"  (1.575 m)    Body mass index is 18.84 kg/m.   Fall Risk  11/16/2016 11/10/2016 10/11/2016 04/22/2016 01/29/2016  Falls in the past year? Yes Yes No No No  Number falls in past yr: 1 1 - - -  Injury with Fall? Yes (No Data) - - -   Assessment & Plan:   Whitney Donaldson is a 83 y.o. female Acute bilateral low back pain with bilateral sciatica - Plan: traMADol (ULTRAM) 50 MG tablet, CT PELVIS WO CONTRAST, CANCELED: CT Abdomen Pelvis W Contrast, CANCELED: CT PELVIS WO CONTRAST  Buttock pain - Plan: traMADol (ULTRAM) 50 MG tablet, CT PELVIS WO CONTRAST, CANCELED: CT Abdomen Pelvis Wo Contrast  Persistent/worsening buttock pain after fall approximately 11/03/16. Initial imaging of lumbar spine and left hip on plain films did not indicate fracture. CT scan was obtained indicating nondisplaced fractures of bilateral sacral ala and S2 vertebral body.  - Discussed with orthopedist on call. These are typically nonoperative fractures, but pain management and function can be sometimes difficult. Will discuss options with patient, and follow-up with orthopedics in office versus emergency room evaluation for possible hospitalist admission as may need transition to skilled nursing facility temporarily.  Called patient, she did sleep better last night, less pain, but did have vomiting once today after taking tramadol. Still feels sick. Will prescribe Zofran 4 mg  every 8 hours when necessary, vomiting/nausea has resolved, can try one half tablet of tramadol. If unable to control pain with half tablet, or persistent nausea vomiting, advised to go to the emergency room.  Plan to follow-up with Dr. Fredonia Highland  in 2 days to discuss options and treatment . ER precautions discussed, understanding expressed. Meds ordered this encounter  Medications  . traMADol (ULTRAM) 50 MG tablet    Sig: Take 1 tablet (50 mg total) by mouth every 6 (six) hours as needed.    Dispense:  30 tablet    Refill:  0   Patient Instructions   Please go to [facility] main entrance for your scheduled CT.  Please arrive at 2:15pm. Please remain there after your scan for further instructions.     I will check a CT scan of the pelvis/bones around the buttock area today to make sure there is not a fracture that was not seen on initial x-rays. For pain, can take tramadol 1 pill every 6 hours as needed. If still not able to achieve pain relief at that dose, let me know and we can change dose or change medication. Be very careful taking tramadol as that can cause sedation or dizziness.   IF you received an x-ray today, you will receive an invoice from [facility] Radiology. Please contact [facility] Radiology at [phone] with questions or concerns regarding your invoice.   IF you received labwork today, you will receive an invoice from [place]. Please contact [facility] at [phone] with questions or concerns regarding your invoice.   Our billing staff will not be able to assist you with questions regarding bills from these companies.  You will be contacted with the lab results as soon as they are available. The fastest way to get your results is to activate your My Chart account. Instructions are located on the last page of this paperwork. If you have not heard from [place] regarding the results in 2 weeks, please contact this office.      I personally performed the services  described in this documentation, which was  scribed in my presence. The recorded information has been reviewed and considered for accuracy and completeness, addended by me as needed, and agree with information above.  Signed,   Merri Ray, MD Primary Care at [place].  11/17/16 12:03 PM

## 2016-11-17 ENCOUNTER — Telehealth: Payer: Self-pay | Admitting: Family Medicine

## 2016-11-17 MED ORDER — ONDANSETRON 4 MG PO TBDP: 4.0000 mg | ORAL_TABLET | Freq: Three times a day (TID) | ORAL | 0 refills | Status: DC | PRN

## 2016-11-17 NOTE — Telephone Encounter (Signed)
Called provided number for peer to peer with additional information on visit yesterday.  17 minute phone call. Status was changed to approved.  #[id number]

## 2016-11-17 NOTE — Telephone Encounter (Signed)
Pt was sent for STAT CT on 5/22. I called to get prior auth and Josem Kaufmann was not approved due to needing more clinical information. I spoke with clinical TL to give number to call for more clinical info and was told pt said she was not sure she was going to go for CT and she would call us to let us know if she ended up going. I never heard if pt called back. [employer] called and said a peer-to-peer can be done for this case before they close it. This will have to be a retro-authorization as well since procedure was performed. Number for peer to peer is [phone] option 4 and then option 2. Case number is [id number].

## 2016-11-17 NOTE — Addendum Note (Signed)
Addended by: Merri Ray R on: 11/17/2016 12:23 PM   Modules accepted: Orders

## 2016-11-19 DIAGNOSIS — M545 Low back pain: Secondary | ICD-10-CM | POA: Diagnosis not present

## 2016-12-01 DIAGNOSIS — D2271 Melanocytic nevi of right lower limb, including hip: Secondary | ICD-10-CM | POA: Diagnosis not present

## 2016-12-01 DIAGNOSIS — D225 Melanocytic nevi of trunk: Secondary | ICD-10-CM | POA: Diagnosis not present

## 2016-12-01 DIAGNOSIS — D1801 Hemangioma of skin and subcutaneous tissue: Secondary | ICD-10-CM | POA: Diagnosis not present

## 2016-12-01 DIAGNOSIS — D2272 Melanocytic nevi of left lower limb, including hip: Secondary | ICD-10-CM | POA: Diagnosis not present

## 2016-12-01 DIAGNOSIS — D2261 Melanocytic nevi of right upper limb, including shoulder: Secondary | ICD-10-CM | POA: Diagnosis not present

## 2016-12-01 DIAGNOSIS — L918 Other hypertrophic disorders of the skin: Secondary | ICD-10-CM | POA: Diagnosis not present

## 2016-12-01 DIAGNOSIS — L814 Other melanin hyperpigmentation: Secondary | ICD-10-CM | POA: Diagnosis not present

## 2016-12-01 DIAGNOSIS — D2262 Melanocytic nevi of left upper limb, including shoulder: Secondary | ICD-10-CM | POA: Diagnosis not present

## 2016-12-01 DIAGNOSIS — L821 Other seborrheic keratosis: Secondary | ICD-10-CM | POA: Diagnosis not present

## 2016-12-02 DIAGNOSIS — Z961 Presence of intraocular lens: Secondary | ICD-10-CM | POA: Diagnosis not present

## 2016-12-02 DIAGNOSIS — Z0100 Encounter for examination of eyes and vision without abnormal findings: Secondary | ICD-10-CM | POA: Diagnosis not present

## 2016-12-02 DIAGNOSIS — H353121 Nonexudative age-related macular degeneration, left eye, early dry stage: Secondary | ICD-10-CM | POA: Diagnosis not present

## 2016-12-02 DIAGNOSIS — H52223 Regular astigmatism, bilateral: Secondary | ICD-10-CM | POA: Diagnosis not present

## 2016-12-02 DIAGNOSIS — H5212 Myopia, left eye: Secondary | ICD-10-CM | POA: Diagnosis not present

## 2016-12-02 DIAGNOSIS — H353111 Nonexudative age-related macular degeneration, right eye, early dry stage: Secondary | ICD-10-CM | POA: Diagnosis not present

## 2016-12-15 DIAGNOSIS — M545 Low back pain: Secondary | ICD-10-CM | POA: Diagnosis not present

## 2016-12-16 ENCOUNTER — Other Ambulatory Visit: Payer: Medicare HMO

## 2016-12-16 ENCOUNTER — Ambulatory Visit: Payer: Medicare HMO | Admitting: Hematology & Oncology

## 2016-12-16 DIAGNOSIS — R69 Illness, unspecified: Secondary | ICD-10-CM | POA: Diagnosis not present

## 2016-12-17 ENCOUNTER — Other Ambulatory Visit (HOSPITAL_BASED_OUTPATIENT_CLINIC_OR_DEPARTMENT_OTHER): Payer: Medicare HMO

## 2016-12-17 ENCOUNTER — Ambulatory Visit (HOSPITAL_BASED_OUTPATIENT_CLINIC_OR_DEPARTMENT_OTHER): Payer: Medicare HMO | Admitting: Hematology & Oncology

## 2016-12-17 VITALS — BP 142/44 | HR 82 | Temp 98.3°F | Resp 16 | Wt 101.0 lb

## 2016-12-17 DIAGNOSIS — M8580 Other specified disorders of bone density and structure, unspecified site: Secondary | ICD-10-CM

## 2016-12-17 DIAGNOSIS — Z853 Personal history of malignant neoplasm of breast: Secondary | ICD-10-CM | POA: Diagnosis not present

## 2016-12-17 DIAGNOSIS — C50912 Malignant neoplasm of unspecified site of left female breast: Secondary | ICD-10-CM

## 2016-12-17 LAB — CBC WITH DIFFERENTIAL (CANCER CENTER ONLY)
BASO#: 0 10*3/uL (ref 0.0–0.2)
BASO%: 0.4 % (ref 0.0–2.0)
EOS%: 0.2 % (ref 0.0–7.0)
Eosinophils Absolute: 0 10*3/uL (ref 0.0–0.5)
HCT: 41.5 % (ref 34.8–46.6)
HGB: 13.6 g/dL (ref 11.6–15.9)
LYMPH#: 0.9 10*3/uL (ref 0.9–3.3)
LYMPH%: 16.2 % (ref 14.0–48.0)
MCH: 31.1 pg (ref 26.0–34.0)
MCHC: 32.8 g/dL (ref 32.0–36.0)
MCV: 95 fL (ref 81–101)
MONO#: 0.6 10*3/uL (ref 0.1–0.9)
MONO%: 10.7 % (ref 0.0–13.0)
NEUT#: 4 10*3/uL (ref 1.5–6.5)
NEUT%: 72.5 % (ref 39.6–80.0)
Platelets: 191 10*3/uL (ref 145–400)
RBC: 4.37 10*6/uL (ref 3.70–5.32)
RDW: 12.4 % (ref 11.1–15.7)
WBC: 5.5 10*3/uL (ref 3.9–10.0)

## 2016-12-17 LAB — CMP (CANCER CENTER ONLY)
ALBUMIN: 3.9 g/dL (ref 3.3–5.5)
ALK PHOS: 116 U/L — AB (ref 26–84)
ALT: 30 U/L (ref 10–47)
AST: 27 U/L (ref 11–38)
BUN: 14 mg/dL (ref 7–22)
CO2: 29 mEq/L (ref 18–33)
Calcium: 9.6 mg/dL (ref 8.0–10.3)
Chloride: 103 mEq/L (ref 98–108)
Creat: 0.9 mg/dl (ref 0.6–1.2)
Glucose, Bld: 97 mg/dL (ref 73–118)
POTASSIUM: 4.3 meq/L (ref 3.3–4.7)
Sodium: 142 mEq/L (ref 128–145)
Total Bilirubin: 0.9 mg/dl (ref 0.20–1.60)
Total Protein: 7.4 g/dL (ref 6.4–8.1)

## 2016-12-17 NOTE — Progress Notes (Signed)
Hematology and Oncology Follow Up Visit  Whitney Donaldson [id number] 16-Nov-1932 83 y.o. 12/17/2016   Principle Diagnosis:  Stage I (T1 N0 M0) ductal carcinoma of the left breast.  Current Therapy:    Observation     Interim History:  [name] is back for followup. We see her yearly.   Unfortunately, she fell a month ago. She just lost her bowels. She fell onto her back and fractured her sacral ala. She did not require any surgery. She is still having a lot of pain. She has been taking some Motrin.  She has not had a bone density test done.  She had a CT scan which showed the fracture.  She also is quite upset that her son is having a lot of issues. He is seeing a psychologist. It sounds like he may need a psychiatrist.  She's had no fever. She's had no change in bowel or bladder habits. She's had no cough or shortness of breath.  Overall, her performance status is ECOG 1.   Medications:  Current Outpatient Prescriptions:  .  aspirin 81 MG tablet, Take 81 mg by mouth daily.  , Disp: , Rfl:  .  baclofen (LIORESAL) 10 MG tablet, Take 0.5 tablets (5 mg total) by mouth 2 (two) times daily as needed for muscle spasms., Disp: 30 each, Rfl: 0 .  Calcium Carbonate-Vitamin D (CALCIUM + D PO), Take by mouth daily. , Disp: , Rfl:  .  Cholecalciferol (VITAMIN D3) 1000 UNITS CAPS, Take by mouth 2 (two) times daily. , Disp: , Rfl:  .  cyanocobalamin 500 MCG tablet, Take 500 mcg by mouth daily. Take 2 pills daily, Disp: , Rfl:  .  FOLIC ACID PO, Take 037 mg by mouth daily. , Disp: , Rfl:  .  LORazepam (ATIVAN) 0.5 MG tablet, TAKE 1 TABLET BY MOUTH EVERY 8 HOURS AS NEEDED, Disp: 30 tablet, Rfl: 0 .  Multiple Vitamin (MULTIVITAMIN) tablet, Take 1 tablet by mouth daily.  , Disp: , Rfl:  .  rosuvastatin (CRESTOR) 20 MG tablet, Take 1 tablet by mouth at bedtime, Disp: 90 tablet, Rfl: 3 .  vitamin C (ASCORBIC ACID) 250 MG tablet, Take 500 mg by mouth daily. , Disp: , Rfl:  .  Vitamin E 400 UNITS  TABS, Take by mouth daily., Disp: , Rfl:   Allergies:  Allergies  Allergen Reactions  . Oxycodone-Acetaminophen     REACTION: nausea    Past Medical History, Surgical history, Social history, and Family History were reviewed and updated.  Review of Systems: As above  Physical Exam:  weight is 101 lb (45.8 kg). Her oral temperature is 98.3 F (36.8 C). Her blood pressure is 142/44 (abnormal) and her pulse is 82. Her respiration is 16 and oxygen saturation is 100%.   Petite white female. Her head and neck exam shows no ocular or oral lesions. There are no palpable cervical or supraclavicular lymph nodes. Lungs are clear bilateral. Cardiac exam regular rate and rhythm with no murmurs rubs or bruits. Abdomen is soft. She has good bowel sounds. There is no fluid wave. There is no palpable liver or spleen tip. This exam right breast with no masses or edema or erythema. There is no right axillary adenopathy. Left breast shows well-healed lumpectomy. The lumpectomy is at the 2:00 position. No masses noted in the left breast. There is no left axillary adenopathy. Neck exam shows no kyphosis. There is no tenderness over the spine ribs or hips. Extremities shows no  clubbing cyanosis or edema. Neurological exam shows no focal neurological deficits. Skin exam no rashes.  Lab Results  Component Value Date   WBC 5.5 12/17/2016   HGB 13.6 12/17/2016   HCT 41.5 12/17/2016   MCV 95 12/17/2016   PLT 191 12/17/2016     Chemistry      Component Value Date/Time   NA 140 01/29/2016 1007   NA 142 12/18/2015 0838   K 4.4 01/29/2016 1007   K 4.0 12/18/2015 0838   CL 102 01/29/2016 1007   CO2 30 01/29/2016 1007   CO2 29 12/18/2015 0838   BUN 25 01/29/2016 1007   BUN 20.9 12/18/2015 0838   CREATININE 0.80 01/29/2016 1007   CREATININE 0.8 12/18/2015 0838      Component Value Date/Time   CALCIUM 9.8 01/29/2016 1007   CALCIUM 9.3 12/18/2015 0838   ALKPHOS 55 01/29/2016 1007   ALKPHOS 56 12/18/2015  0838   AST 19 01/29/2016 1007   AST 18 12/18/2015 0838   ALT 19 01/29/2016 1007   ALT 22 12/18/2015 0838   BILITOT 0.5 01/29/2016 1007   BILITOT 0.60 12/18/2015 0838       Impression and Plan: Whitney Donaldson is a 83 year old white female with end-stage 1 ductal carcinoma the left breast. She's doing well. She is 16 years out now. I see no evidence of recurrent disease.  It sounds like she does have osteoporosis. Her last bone density test was 4 years ago. I believe that she needs to have a bone density test done. She may need to be placed onto bisphosphonate therapy or Prolia.  I also feel that her son is having a hard time. I gave her the name of a psychiatrist that she can can contact for her son.   We will plan to go back in one more year.   Volanda Napoleon, MD 6/22/20188:47 AM

## 2016-12-23 ENCOUNTER — Encounter: Payer: Self-pay | Admitting: Physical Therapy

## 2016-12-23 ENCOUNTER — Ambulatory Visit: Payer: Medicare HMO | Attending: Orthopedic Surgery | Admitting: Physical Therapy

## 2016-12-23 DIAGNOSIS — R262 Difficulty in walking, not elsewhere classified: Secondary | ICD-10-CM | POA: Diagnosis present

## 2016-12-23 DIAGNOSIS — M545 Low back pain, unspecified: Secondary | ICD-10-CM

## 2016-12-23 DIAGNOSIS — M25552 Pain in left hip: Secondary | ICD-10-CM

## 2016-12-23 NOTE — Therapy (Signed)
[place] [address] [place], [place], [address] Phone: [phone]   Fax:  [phone]  Physical Therapy Evaluation  Patient Details  Name: CARROLL RANNEY MRN: [id number] Date of Birth: 1932/08/02 Referring Provider: Edmonia Lynch  Encounter Date: 12/23/2016      PT End of Session - 12/23/16 1518    Visit Number 1   Date for PT Re-Evaluation 02/22/17   PT Start Time 1447   PT Stop Time 1537   PT Time Calculation (min) 50 min   Activity Tolerance Patient tolerated treatment well   Behavior During Therapy [facility] for tasks assessed/performed      Past Medical History:  Diagnosis Date  . Arthritis   . Cancer ([place])    breast hx of, skin hx of  . Nephrolithiasis     X1  . Osteopenia     Past Surgical History:  Procedure Laterality Date  . COLONOSCOPY  2004    negative; Dr Fuller Plan  . colonoscopy with polypectomy  2014  . mastestomy  2003   partial /left  . SKIN CANCER EXCISION     chest , Dr Ubaldo Glassing    There were no vitals filed for this visit.       Subjective Assessment - 12/23/16 1451    Subjective Mid may she reports that she fell and landed on her backside.  Reports just some soreness at first but reports a few days later much more pain and was having difficulty with any activity.  An x-ray of the pelvis showed some non displaced fractures of the sacral area.  She reports that over the last week she has started feeling better.   Limitations Walking;House hold activities   Patient Stated Goals do as much as I used to do without pain   Currently in Pain? Yes   Pain Score 2    Pain Location Back   Pain Orientation Lower   Pain Descriptors / Indicators Aching;Sore;Sharp   Pain Type Acute pain   Pain Radiating Towards patient does have pain in the groin at night pain a 6/10   Pain Onset More than a month ago   Pain Frequency Constant   Aggravating Factors  doing activtiy, and at night pain can be  up to 8/10   Pain Relieving Factors reports that a warm bath helps at best pain a 2/10   Effect of Pain on Daily Activities limits all activities            [facility] PT Assessment - 12/23/16 0001      Assessment   Medical Diagnosis LBP, sacral fracture   Referring Provider Edmonia Lynch   Onset Date/Surgical Date 11/04/16   Prior Therapy no     Precautions   Precautions None     Balance Screen   Has the patient fallen in the past 6 months Yes   How many times? 1   Has the patient had a decrease in activity level because of a fear of falling?  No   Is the patient reluctant to leave their home because of a fear of falling?  No     Home Environment   Additional Comments stairs at home, does yardwork and housework     Prior Function   Level of Independence Independent   Vocation Retired   Leisure a Scientist, forensic tests Other     Other:   Other/ Comments unable  to do a single leg bridge on the left due to pain     Posture/Postural Control   Posture Comments decreased lordosis, fwd head, rounded shoulders     ROM / Strength   AROM / PROM / Strength AROM;Strength     AROM   Overall AROM Comments Lumbar ROM was decreased 50% for extension and side bending, WNL;s for flexion     Strength   Overall Strength Comments Left adduction 3+/5 with left groin pain, flexion of the hips 3+/5     Flexibility   Soft Tissue Assessment /Muscle Length yes   Hamstrings very tight   Piriformis very tight and adductors increase pain in the left groin     Ambulation/Gait   Gait Comments no device, mild antalgic on the left            Objective measurements completed on examination: See above findings.          [facility] Adult PT Treatment/Exercise - 12/23/16 0001      Modalities   Modalities Electrical Stimulation;Moist Heat     Moist Heat Therapy   Number Minutes Moist Heat 15 Minutes   Moist Heat Location Lumbar Spine;Hip      Electrical Stimulation   Electrical Stimulation Location left SI and groin   Electrical Stimulation Action IFC   Electrical Stimulation Parameters supine   Electrical Stimulation Goals Pain                  PT Short Term Goals - 12/23/16 1522      PT SHORT TERM GOAL #1   Title independent iwth initial HEP   Time 2   Period Weeks   Status New           PT Long Term Goals - 12/23/16 1522      PT LONG TERM GOAL #1   Title decrease pain 50%   Time 8   Period Weeks   Status New     PT LONG TERM GOAL #2   Title increase left hip strength to 4+/5   Time 8   Period Weeks   Status New     PT LONG TERM GOAL #3   Title no difficulty with stairs reciprocally   Time 8   Period Weeks   Status New     PT LONG TERM GOAL #4   Title reports able to do yardwork and have 50% less pain at night   Time 8   Period Weeks   Status New                Plan - 12/23/16 1519    Clinical Impression Statement Patient had a fall landing on the backside on or about 11/04/16.  She reports some significant pain over the next fewdays that limited her locomotion, an x-ray of the pelvis showed a fracture of the sacrum.  She reports that she has been able to do more over the past few weeks but is having a lot of pain and reports that it limits her ability, she is weak with left hip adduction due to pain.  She has tightness of the left HS, piriformis and adductors   Clinical Presentation Evolving   Clinical Decision Making Low   Rehab Potential Good   PT Frequency 2x / week   PT Duration 8 weeks   PT Treatment/Interventions ADLs/Self Care Home Management;Cryotherapy;Electrical Stimulation;Moist Heat;Ultrasound;Gait training;Patient/family education;Balance training;Therapeutic exercise;Therapeutic activities;Functional mobility training;Stair training;Manual techniques   PT Next Visit Plan slowly start  HEP and gym exercises   Consulted and Agree with Plan of Care Patient       Patient will benefit from skilled therapeutic intervention in order to improve the following deficits and impairments:  Abnormal gait, Decreased activity tolerance, Decreased balance, Decreased mobility, Decreased strength, Improper body mechanics, Impaired flexibility, Pain, Decreased range of motion, Difficulty walking  Visit Diagnosis: Acute bilateral low back pain without sciatica - Plan: PT plan of care cert/re-cert  Pain in left hip - Plan: PT plan of care cert/re-cert  Difficulty in walking, not elsewhere classified - Plan: PT plan of care cert/re-cert      G-Codes - 53/97/67 1528    Functional Assessment Tool Used (Outpatient Only) foto 53% limitation   Functional Limitation Mobility: Walking and moving around   Mobility: Walking and Moving Around Current Status [phone]) At least 40 percent but less than 60 percent impaired, limited or restricted   Mobility: Walking and Moving Around Goal Status [phone]) At least 20 percent but less than 40 percent impaired, limited or restricted       Problem List Patient Active Problem List   Diagnosis Date Noted  . Stomach pain 01/19/2016  . Routine general medical examination at a health care facility 12/08/2015  . Nonspecific abnormal electrocardiogram (ECG) (EKG) 12/05/2014  . Osteopenia 07/31/2009  . ANXIETY STATE, UNSPECIFIED 07/04/2008  . Hyperlipemia 04/04/2008  . Nocturia 04/04/2008    Sumner Boast., PT 12/23/2016, 3:33 PM  [place] [address] [place], [place], [address] Phone: [phone]   Fax:  [phone]  Name: MAUDINE KLUESNER MRN: [id number] Date of Birth: 05-19-1933

## 2016-12-31 DIAGNOSIS — M25552 Pain in left hip: Secondary | ICD-10-CM | POA: Diagnosis not present

## 2017-01-03 ENCOUNTER — Encounter: Payer: Medicare HMO | Admitting: Physical Therapy

## 2017-01-03 ENCOUNTER — Ambulatory Visit: Payer: Medicare HMO | Attending: Orthopedic Surgery | Admitting: Physical Therapy

## 2017-01-03 ENCOUNTER — Encounter: Payer: Self-pay | Admitting: Physical Therapy

## 2017-01-03 DIAGNOSIS — R262 Difficulty in walking, not elsewhere classified: Secondary | ICD-10-CM | POA: Diagnosis present

## 2017-01-03 DIAGNOSIS — M545 Low back pain, unspecified: Secondary | ICD-10-CM

## 2017-01-03 DIAGNOSIS — M25552 Pain in left hip: Secondary | ICD-10-CM | POA: Diagnosis present

## 2017-01-03 NOTE — Therapy (Signed)
[place] [address], [place], [address] Phone: [phone]   Fax:  [phone]  Physical Therapy Treatment  Patient Details  Name: Whitney Donaldson MRN: [id number] Date of Birth: 11/03/1932 Referring Provider: Edmonia Lynch  Encounter Date: 01/03/2017      PT End of Session - 01/03/17 1537    Visit Number 2   Date for PT Re-Evaluation 02/22/17   PT Start Time 1455   PT Stop Time 1553   PT Time Calculation (min) 58 min   Activity Tolerance Patient tolerated treatment well   Behavior During Therapy [facility] for tasks assessed/performed      Past Medical History:  Diagnosis Date  . Arthritis   . Cancer ([place])    breast hx of, skin hx of  . Nephrolithiasis     X1  . Osteopenia     Past Surgical History:  Procedure Laterality Date  . COLONOSCOPY  2004    negative; Dr Fuller Plan  . colonoscopy with polypectomy  2014  . mastestomy  2003   partial /left  . SKIN CANCER EXCISION     chest , Dr Ubaldo Glassing    There were no vitals filed for this visit.      Subjective Assessment - 01/03/17 1459    Subjective Patient reports some increase of pain after the evaluation, she reports that she saw her MD on Friday, x-rays negative.  Continues to have pain in the left groin area with standing and "especially stairs"   Currently in Pain? Yes   Pain Score 4    Pain Location Groin   Pain Orientation Left;Anterior   Aggravating Factors  standing and stairs                         [facility] Adult PT Treatment/Exercise - 01/03/17 0001      Exercises   Exercises Lumbar     Lumbar Exercises: Stretches   Passive Hamstring Stretch 4 reps;20 seconds   Hip Flexor Stretch 4 reps;20 seconds   Quad Stretch 4 reps;20 seconds   Piriformis Stretch 4 reps;20 seconds   Piriformis Stretch Limitations also did an adductor stretch     Lumbar Exercises: Aerobic   Stationary Bike Nustep Level 4 x 6 minutes     Lumbar  Exercises: Supine   Other Supine Lumbar Exercises feet on ball K2C, trunk rotation, small bridge     Modalities   Modalities Electrical Stimulation;Moist Heat     Moist Heat Therapy   Number Minutes Moist Heat 15 Minutes   Moist Heat Location Lumbar Spine;Hip     Electrical Stimulation   Electrical Stimulation Location left SI and groin   Electrical Stimulation Action IFC   Electrical Stimulation Parameters supine   Electrical Stimulation Goals Pain     Manual Therapy   Manual Therapy Soft tissue mobilization   Soft tissue mobilization to the left groing/adductor and some of the left ITB                  PT Short Term Goals - 12/23/16 1522      PT SHORT TERM GOAL #1   Title independent iwth initial HEP   Time 2   Period Weeks   Status New           PT Long Term Goals - 12/23/16 1522      PT LONG TERM GOAL #1   Title decrease pain 50%  Time 8   Period Weeks   Status New     PT LONG TERM GOAL #2   Title increase left hip strength to 4+/5   Time 8   Period Weeks   Status New     PT LONG TERM GOAL #3   Title no difficulty with stairs reciprocally   Time 8   Period Weeks   Status New     PT LONG TERM GOAL #4   Title reports able to do yardwork and have 50% less pain at night   Time 8   Period Weeks   Status New               Plan - 01/03/17 1537    Clinical Impression Statement The exercises that we did today did not cause any pain.  She is very tight in the left adductor, the hip flexor and the quad.  I could replicate her pain to some extent with adductor stretch and adductor STM.  She also was very tender in the ITB but this was not her pain.   PT Next Visit Plan See how today's treatment went with pain, she reports that she started prednisone on Saturday   Consulted and Agree with Plan of Care Patient      Patient will benefit from skilled therapeutic intervention in order to improve the following deficits and impairments:   Abnormal gait, Decreased activity tolerance, Decreased balance, Decreased mobility, Decreased strength, Improper body mechanics, Impaired flexibility, Pain, Decreased range of motion, Difficulty walking  Visit Diagnosis: Acute bilateral low back pain without sciatica  Pain in left hip  Difficulty in walking, not elsewhere classified     Problem List Patient Active Problem List   Diagnosis Date Noted  . Stomach pain 01/19/2016  . Routine general medical examination at a health care facility 12/08/2015  . Nonspecific abnormal electrocardiogram (ECG) (EKG) 12/05/2014  . Osteopenia 07/31/2009  . ANXIETY STATE, UNSPECIFIED 07/04/2008  . Hyperlipemia 04/04/2008  . Nocturia 04/04/2008    Sumner Boast., PT 01/03/2017, 3:40 PM  [address] [place], [place], [address] Phone: [phone]   Fax:  [phone]  Name: SHYLIN KEIZER MRN: [id number] Date of Birth: [birth year]

## 2017-01-05 ENCOUNTER — Encounter: Payer: Medicare HMO | Admitting: Physical Therapy

## 2017-01-05 ENCOUNTER — Ambulatory Visit: Payer: Medicare HMO | Admitting: Physical Therapy

## 2017-01-05 DIAGNOSIS — M545 Low back pain, unspecified: Secondary | ICD-10-CM

## 2017-01-05 DIAGNOSIS — R262 Difficulty in walking, not elsewhere classified: Secondary | ICD-10-CM

## 2017-01-05 DIAGNOSIS — M25552 Pain in left hip: Secondary | ICD-10-CM

## 2017-01-05 NOTE — Therapy (Signed)
[place], [place], [address] Phone: [phone]   Fax:  [phone]  Physical Therapy Treatment  Patient Details  Name: Whitney Donaldson MRN: [id number] Date of Birth: 12-18-1932 Referring Provider: Edmonia Lynch  Encounter Date: 01/05/2017      PT End of Session - 01/05/17 1753    Visit Number 3   Date for PT Re-Evaluation 02/22/17   PT Start Time 1534   PT Stop Time 1624   PT Time Calculation (min) 50 min   Activity Tolerance Patient tolerated treatment well   Behavior During Therapy [facility] for tasks assessed/performed      Past Medical History:  Diagnosis Date  . Arthritis   . Cancer ([place])    breast hx of, skin hx of  . Nephrolithiasis     X1  . Osteopenia     Past Surgical History:  Procedure Laterality Date  . COLONOSCOPY  2004    negative; Dr Fuller Plan  . colonoscopy with polypectomy  2014  . mastestomy  2003   partial /left  . SKIN CANCER EXCISION     chest , Dr Ubaldo Glassing    There were no vitals filed for this visit.      Subjective Assessment - 01/05/17 1619    Subjective Pt reports that she feels much better today. She is still very tight in her adductor muscles but said it doesn't hurt as much has it has. She has been taking Prednisone since Monday.    Patient Stated Goals do as much as I used to do without pain   Currently in Pain? No/denies   Pain Score 0-No pain   Aggravating Factors  Pt reports 4/10 pain with activity and "certain movements" but cannot describe the specific movements that aggrivate it.                          [facility] Adult PT Treatment/Exercise - 01/05/17 0001      Lumbar Exercises: Stretches   Quad Stretch 4 reps;20 seconds   ITB Stretch 4 reps;20 seconds   Piriformis Stretch Limitations also did an adductor stretch     Modalities   Modalities Electrical Stimulation;Moist Heat     Moist Heat Therapy   Number Minutes Moist Heat  15 Minutes   Moist Heat Location --  Left adductor muscles and quad region     Electrical Stimulation   Electrical Stimulation Location Left groin   Electrical Stimulation Action IFC   Electrical Stimulation Parameters supine   Electrical Stimulation Goals Pain     Manual Therapy   Manual Therapy Soft tissue mobilization   Soft tissue mobilization to the left groin/adductor and some of the left ITB                  PT Short Term Goals - 01/05/17 1801      PT SHORT TERM GOAL #1   Title independent iwth initial HEP   Time 2   Period Weeks   Status Achieved           PT Long Term Goals - 01/05/17 1801      PT LONG TERM GOAL #1   Title decrease pain 50%   Time 8   Period Weeks   Status Achieved     PT LONG TERM GOAL #2   Title increase left hip strength to 4+/5   Time 8   Period Weeks  Status Partially Met     PT LONG TERM GOAL #3   Title no difficulty with stairs reciprocally   Time 8   Period Weeks   Status On-going     PT LONG TERM GOAL #4   Title reports able to do yardwork and have 50% less pain at night   Time 8   Period Weeks   Status Achieved               Plan - 01/05/17 1754    Clinical Impression Statement Pt is still very tight in her left adductor muscles and ITB, but she reports decreased pain overall. Adductor stretch was not painful today, but ITB was still tender.    PT Treatment/Interventions ADLs/Self Care Home Management;Cryotherapy;Electrical Stimulation;Moist Heat;Ultrasound;Gait training;Patient/family education;Balance training;Therapeutic exercise;Therapeutic activities;Functional mobility training;Stair training;Manual techniques   PT Next Visit Plan Pt will be on vacation for a week. She will continue with HEP and let us know if she needs to come back for more appointments.    Consulted and Agree with Plan of Care Patient      Patient will benefit from skilled therapeutic intervention in order to improve the  following deficits and impairments:  Abnormal gait, Decreased activity tolerance, Decreased balance, Decreased mobility, Decreased strength, Improper body mechanics, Impaired flexibility, Pain, Decreased range of motion, Difficulty walking  Visit Diagnosis: Acute bilateral low back pain without sciatica  Difficulty in walking, not elsewhere classified  Pain in left hip     Problem List Patient Active Problem List   Diagnosis Date Noted  . Stomach pain 01/19/2016  . Routine general medical examination at a health care facility 12/08/2015  . Nonspecific abnormal electrocardiogram (ECG) (EKG) 12/05/2014  . Osteopenia 07/31/2009  . ANXIETY STATE, UNSPECIFIED 07/04/2008  . Hyperlipemia 04/04/2008  . Nocturia 04/04/2008    Lennart Pall, SPT 01/05/2017, 6:05 PM  [place] [address] [place], [place], [address] Phone: [phone]   Fax:  [phone]  Name: SHARILYNN CASSITY MRN: [id number] Date of Birth: 25-Feb-1933

## 2017-01-25 ENCOUNTER — Ambulatory Visit (INDEPENDENT_AMBULATORY_CARE_PROVIDER_SITE_OTHER): Payer: Medicare HMO | Admitting: Nurse Practitioner

## 2017-01-25 ENCOUNTER — Encounter: Payer: Self-pay | Admitting: Nurse Practitioner

## 2017-01-25 VITALS — BP 132/76 | HR 84 | Temp 98.2°F | Resp 17 | Ht 62.0 in | Wt 100.4 lb

## 2017-01-25 DIAGNOSIS — R69 Illness, unspecified: Secondary | ICD-10-CM | POA: Diagnosis not present

## 2017-01-25 DIAGNOSIS — F411 Generalized anxiety disorder: Secondary | ICD-10-CM | POA: Diagnosis not present

## 2017-01-25 DIAGNOSIS — S3210XD Unspecified fracture of sacrum, subsequent encounter for fracture with routine healing: Secondary | ICD-10-CM | POA: Diagnosis not present

## 2017-01-25 DIAGNOSIS — N3281 Overactive bladder: Secondary | ICD-10-CM | POA: Diagnosis not present

## 2017-01-25 DIAGNOSIS — R634 Abnormal weight loss: Secondary | ICD-10-CM

## 2017-01-25 DIAGNOSIS — E2839 Other primary ovarian failure: Secondary | ICD-10-CM | POA: Diagnosis not present

## 2017-01-25 DIAGNOSIS — E782 Mixed hyperlipidemia: Secondary | ICD-10-CM

## 2017-01-25 LAB — CBC WITH DIFFERENTIAL/PLATELET
BASOS ABS: 0 {cells}/uL (ref 0–200)
Basophils Relative: 0 %
Eosinophils Absolute: 0 cells/uL — ABNORMAL LOW (ref 15–500)
Eosinophils Relative: 0 %
HEMATOCRIT: 40.3 % (ref 35.0–45.0)
HEMOGLOBIN: 13.2 g/dL (ref 11.7–15.5)
LYMPHS ABS: 880 {cells}/uL (ref 850–3900)
Lymphocytes Relative: 20 %
MCH: 30.9 pg (ref 27.0–33.0)
MCHC: 32.8 g/dL (ref 32.0–36.0)
MCV: 94.4 fL (ref 80.0–100.0)
MONO ABS: 396 {cells}/uL (ref 200–950)
MPV: 11 fL (ref 7.5–12.5)
Monocytes Relative: 9 %
NEUTROS PCT: 71 %
Neutro Abs: 3124 cells/uL (ref 1500–7800)
Platelets: 178 10*3/uL (ref 140–400)
RBC: 4.27 MIL/uL (ref 3.80–5.10)
RDW: 13.8 % (ref 11.0–15.0)
WBC: 4.4 10*3/uL (ref 3.8–10.8)

## 2017-01-25 LAB — TSH: TSH: 1.42 mIU/L

## 2017-01-25 MED ORDER — ROSUVASTATIN CALCIUM 20 MG PO TABS: ORAL_TABLET | ORAL | 1 refills | Status: DC

## 2017-01-25 MED ORDER — ZOSTER VAC RECOMB ADJUVANTED 50 MCG/0.5ML IM SUSR: 0.5000 mL | Freq: Once | INTRAMUSCULAR | 1 refills | Status: AC

## 2017-01-25 NOTE — Patient Instructions (Addendum)
Push fluids throughout the day.  6-8 8 oz glasses Stop fluids 2 hours before bed.   For pain can use tylenol 650 mg by mouth every 6 hours as needed  Take ensure after lunch

## 2017-01-25 NOTE — Progress Notes (Signed)
Careteam: Patient Care Team: Lauree Chandler, NP as PCP - General (Geriatric Medicine) Renette Butters, MD as Consulting Physician (Orthopedic Surgery) Rolm Bookbinder, MD as Consulting Physician (Dermatology) Kathie Rhodes, MD as Consulting Physician (Urology) Marin Olp Rudell Cobb, MD as Consulting Physician (Oncology)  Advanced Directive information Does Patient Have a Medical Advance Directive?: Yes, Type of Advance Directive: [place];Living will  Allergies  Allergen Reactions  . Oxycodone-Acetaminophen     REACTION: nausea    Chief Complaint  Patient presents with  . Medical Management of Chronic Issues    Pt is being seen to establish care for management of chronic conditions. Pt previous PCP was Pricilla Holm  . Other    Pt would like a referral for bone density     HPI: Patient is a 83 y.o. female seen in the office today to establish care.  Hx of breast cancer s/p mastectomy, hyperlipidemia.   Breast cancer in 2005- following with Dr Marin Olp yearly, mammogram yearly  Skin Cancer, hx- dermatology yearly for full body exam  In May she fell and went to urgent care, found to have nondisplaced fractures of bilateral sacral and S2 vertebral body. Followed with orthopedic, non-operative fracture therefore was followed for pain management. In the last 2 weeks the pain is gone. Did not tolerate oxycodone or tramadol due to nausea.  Following with therapy. Which has helped with pain.  Hx of nephrolithiasis, following with urologist.  .  Gets up to go to the bathroom 3 times at night.  Drinks a lot of water late in the evening.  Urologist Dr Jeffie Pollock did not want her taking medications for OAB due to side effects. This was over 3 years and symptoms have been persistent for over 3 years, has been chronic and stable.  Reports she is gradually losing weight. Last year she was 105 lb and today 100 lbs. States she has good appetite. Low energy most  days occasional nausea, does not effect appetite or intake. worse in the morning. been going on for ~4 weeks. Happens several days a week.  Feels stressed. Increase stress due to husband with dementia. He stresses her.  Sometimes feels better when she eats.  Feels like she may vomit but then never does.  Last episode 2 days ago.    Review of Systems:  Review of Systems  Constitutional: Positive for malaise/fatigue and weight loss. Negative for chills and fever.  Respiratory: Negative for cough, sputum production and shortness of breath.   Cardiovascular: Negative for chest pain, palpitations and leg swelling.  Gastrointestinal: Positive for nausea. Negative for abdominal pain, constipation, diarrhea and heartburn.  Genitourinary: Positive for frequency. Negative for dysuria and urgency.  Musculoskeletal: Positive for falls, joint pain and myalgias. Negative for back pain.  Skin: Negative.   Neurological: Positive for weakness. Negative for dizziness and headaches.  Psychiatric/Behavioral: Negative for depression and memory loss. The patient does not have insomnia.     Past Medical History:  Diagnosis Date  . Arthritis   . Cancer ([place])    breast hx of, skin hx of  . Hyperlipidemia   . Nephrolithiasis     X1  . Osteopenia    Past Surgical History:  Procedure Laterality Date  . COLONOSCOPY  2004    negative; Dr Fuller Plan  . colonoscopy with polypectomy  2014  . mastestomy  2003   partial /left  . SKIN CANCER EXCISION     chest , Dr Ubaldo Glassing   Social History:  reports that she has never smoked. She has never used smokeless tobacco. She reports that she does not drink alcohol or use drugs.  Family History  Problem Relation Age of Onset  . Heart attack Mother 31  . Lung cancer Father        smoker  . Stroke Sister 27  . Alzheimer's disease Sister   . Heart attack Brother 13  . Diabetes Neg Hx     Medications: Patient's Medications  New Prescriptions   No medications on  file  Previous Medications   ASPIRIN 81 MG TABLET    Take 81 mg by mouth daily.     BETA CAROTENE W/MINERALS (OCUVITE) TABLET    Take 1 tablet by mouth daily.   CALCIUM CARBONATE-VITAMIN D (CALCIUM + D PO)    Take by mouth daily.    CHOLECALCIFEROL (VITAMIN D3) 1000 UNITS CAPS    Take by mouth 2 (two) times daily.    COENZYME Q10 (COQ10 PO)    Take 1 tablet by mouth daily.   CYANOCOBALAMIN 500 MCG TABLET    Take 500 mcg by mouth daily. Take 2 pills daily   FOLIC ACID PO    Take 053 mg by mouth daily.    MULTIPLE VITAMIN (MULTIVITAMIN) TABLET    Take 1 tablet by mouth daily.     ROSUVASTATIN (CRESTOR) 20 MG TABLET    Take 1 tablet by mouth at bedtime   VITAMIN C (ASCORBIC ACID) 250 MG TABLET    Take 500 mg by mouth daily.    VITAMIN E 400 UNITS TABS    Take by mouth daily.   ZOSTER VAC RECOMB ADJUVANTED (SHINGRIX) INJECTION    Inject 0.5 mLs into the muscle once.  Modified Medications   No medications on file  Discontinued Medications   BACLOFEN (LIORESAL) 10 MG TABLET    Take 0.5 tablets (5 mg total) by mouth 2 (two) times daily as needed for muscle spasms.   LORAZEPAM (ATIVAN) 0.5 MG TABLET    TAKE 1 TABLET BY MOUTH EVERY 8 HOURS AS NEEDED     Physical Exam:  Vitals:   01/25/17 0900  BP: 132/76  Pulse: 84  Resp: 17  Temp: 98.2 F (36.8 C)  TempSrc: Oral  SpO2: 98%  Weight: 100 lb 6.4 oz (45.5 kg)  Height: '5\' 2"'$  (1.575 m)   Body mass index is 18.36 kg/m.  Physical Exam  Constitutional: She is oriented to person, place, and time. She appears well-developed and well-nourished. No distress.  Thin female, NAD  HENT:  Head: Normocephalic and atraumatic.  Mouth/Throat: Oropharynx is clear and moist. No oropharyngeal exudate.  Eyes: Pupils are equal, round, and reactive to light. Conjunctivae are normal.  Neck: Normal range of motion. Neck supple.  Cardiovascular: Normal rate, regular rhythm and normal heart sounds.   Pulmonary/Chest: Effort normal and breath sounds normal.   Abdominal: Soft. Bowel sounds are normal. She exhibits no distension. There is no tenderness. There is no guarding.  Musculoskeletal: She exhibits no edema or tenderness.  Neurological: She is alert and oriented to person, place, and time.  Skin: Skin is warm and dry. She is not diaphoretic.  Psychiatric: She has a normal mood and affect.   Labs reviewed: Basic Metabolic Panel:  Recent Labs  01/29/16 1007 12/17/16 0819  NA 140 142  K 4.4 4.3  CL 102 103  CO2 30 29  GLUCOSE 97 97  BUN 25 14  CREATININE 0.80 0.9  CALCIUM 9.8 9.6  Liver Function Tests:  Recent Labs  01/29/16 1007 12/17/16 0819  AST 19 27  ALT 19 30  ALKPHOS 55 116*  BILITOT 0.5 0.90  PROT 6.9 7.4  ALBUMIN 4.4 3.9    Recent Labs  01/29/16 1007  LIPASE 16   No results for input(s): AMMONIA in the last 8760 hours. CBC:  Recent Labs  01/29/16 1016 10/11/16 1107 12/17/16 0819  WBC 5.6 6.1 5.5  NEUTROABS  --   --  4.0  HGB 14.1 13.0 13.6  HCT 41.1 38.5 41.5  MCV 92.8 93.4 95  PLT  --   --  191   Lipid Panel: No results for input(s): CHOL, HDL, LDLCALC, TRIG, CHOLHDL, LDLDIRECT in the last 8760 hours. TSH: No results for input(s): TSH in the last 8760 hours. A1C: Lab Results  Component Value Date   HGBA1C 6.2 11/29/2013     Assessment/Plan 1. Estrogen deficiency Osteopenia noted on last dexa scan, Recommended to take caltrate with D 600/400 twice a day.  Weight bearing exercises recommended -will follow up bone density  - DG Bone Density; Future  2. Mixed hyperlipidemia - rosuvastatin (CRESTOR) 20 MG tablet; Take 1 tablet by mouth at bedtime  Dispense: 90 tablet; Refill: 1 - Lipid Panel  3. Weight loss Gradual 5 lb weight loss since last year.  -reports small meal at lunch, to add ensure after this meal -will get lab work at this time, also having some nausea that comes and goes. Reports this does not effect appetite or intake. Some relief with tums, after reviewing chart  appears she had similar episode last year.  - TSH - CMP with eGFR - CBC with Differential/Platelets - Amylase - Lipase - H. pylori breath test  4. Anxiety state -improved, does not use lorazepam, removed from medication list.   5. Overactive bladder Did not want to start on medication in the past due to side effects however myrbetriq would most likely be beneficial. Will consider start at next visit.   6. Closed fracture of sacrum with routine healing, unspecified portion of sacrum, subsequent encounter Improved, currently pain free.   Follow up in 4 weeks for AWV with physical  Regie Bunner K. Harle Battiest  [facility] [phone] 8 am - 5 pm) [phone] (after hours)

## 2017-01-26 LAB — LIPID PANEL
CHOLESTEROL: 198 mg/dL (ref <200)
HDL: 68 mg/dL (ref >50)
LDL Cholesterol: 114 mg/dL — ABNORMAL HIGH (ref <100)
TRIGLYCERIDES: 79 mg/dL (ref <150)
Total CHOL/HDL Ratio: 2.9 Ratio (ref <5.0)
VLDL: 16 mg/dL (ref <30)

## 2017-01-26 LAB — COMPLETE METABOLIC PANEL WITH GFR
ALBUMIN: 4.1 g/dL (ref 3.6–5.1)
ALK PHOS: 82 U/L (ref 33–130)
ALT: 15 U/L (ref 6–29)
AST: 18 U/L (ref 10–35)
BUN: 22 mg/dL (ref 7–25)
CALCIUM: 9.2 mg/dL (ref 8.6–10.4)
CO2: 25 mmol/L (ref 20–31)
Chloride: 104 mmol/L (ref 98–110)
Creat: 0.66 mg/dL (ref 0.60–0.88)
GFR, Est African American: >89 mL/min (ref >=60)
GFR, Est Non African American: 83 mL/min (ref >=60)
Glucose, Bld: 86 mg/dL (ref 65–99)
Potassium: 4.5 mmol/L (ref 3.5–5.3)
Sodium: 141 mmol/L (ref 135–146)
TOTAL PROTEIN: 6.4 g/dL (ref 6.1–8.1)
Total Bilirubin: 0.4 mg/dL (ref 0.2–1.2)

## 2017-01-26 LAB — LIPASE: LIPASE: 17 U/L (ref 7–60)

## 2017-01-26 LAB — AMYLASE: Amylase: 53 U/L (ref 21–101)

## 2017-01-26 LAB — H. PYLORI BREATH TEST: H. pylori Breath Test: DETECTED — AB

## 2017-01-27 ENCOUNTER — Telehealth: Payer: Self-pay

## 2017-01-27 ENCOUNTER — Other Ambulatory Visit: Payer: Self-pay | Admitting: Nurse Practitioner

## 2017-01-27 DIAGNOSIS — A048 Other specified bacterial intestinal infections: Secondary | ICD-10-CM

## 2017-01-27 HISTORY — DX: Other specified bacterial intestinal infections: A04.8

## 2017-01-27 MED ORDER — OMEPRAZOLE 20 MG PO CPDR: 20.0000 mg | DELAYED_RELEASE_CAPSULE | Freq: Two times a day (BID) | ORAL | 0 refills | Status: DC

## 2017-01-27 MED ORDER — AMOXICILL-CLARITHRO-LANSOPRAZ PO MISC: Freq: Two times a day (BID) | ORAL | 0 refills | Status: DC

## 2017-01-27 MED ORDER — BIS SUBCIT-METRONID-TETRACYC 140-125-125 MG PO CAPS: 3.0000 | ORAL_CAPSULE | Freq: Three times a day (TID) | ORAL | 0 refills | Status: DC

## 2017-01-27 NOTE — Telephone Encounter (Signed)
Received fax from [facility] needed prior auth on Pylera and Omeprazole. Called [facility] [phone], [name] spoke with the clinical pharmacist at [facility], he suggested [facility]. Jessica faxed new Rx to [facility]. Removed pylera and omeprazole off medication list.

## 2017-02-21 ENCOUNTER — Ambulatory Visit
Admission: RE | Admit: 2017-02-21 | Discharge: 2017-02-21 | Disposition: A | Discharge status: Home / Self Care | Payer: Medicare HMO | Source: Ambulatory Visit | Attending: Nurse Practitioner | Admitting: Nurse Practitioner

## 2017-02-21 DIAGNOSIS — E2839 Other primary ovarian failure: Secondary | ICD-10-CM

## 2017-02-21 DIAGNOSIS — Z780 Asymptomatic menopausal state: Secondary | ICD-10-CM | POA: Diagnosis not present

## 2017-02-21 DIAGNOSIS — M810 Age-related osteoporosis without current pathological fracture: Secondary | ICD-10-CM | POA: Diagnosis not present

## 2017-03-14 ENCOUNTER — Encounter: Payer: Self-pay | Admitting: Nurse Practitioner

## 2017-03-14 ENCOUNTER — Ambulatory Visit (INDEPENDENT_AMBULATORY_CARE_PROVIDER_SITE_OTHER): Payer: Medicare HMO | Admitting: Nurse Practitioner

## 2017-03-14 ENCOUNTER — Ambulatory Visit (INDEPENDENT_AMBULATORY_CARE_PROVIDER_SITE_OTHER): Payer: Medicare HMO

## 2017-03-14 VITALS — BP 132/72 | HR 84 | Temp 98.0°F | Resp 18 | Ht 62.0 in | Wt 101.0 lb

## 2017-03-14 VITALS — BP 138/50 | HR 84 | Temp 98.2°F | Ht 62.0 in | Wt 101.0 lb

## 2017-03-14 DIAGNOSIS — Z23 Encounter for immunization: Secondary | ICD-10-CM

## 2017-03-14 DIAGNOSIS — M8580 Other specified disorders of bone density and structure, unspecified site: Secondary | ICD-10-CM | POA: Diagnosis not present

## 2017-03-14 DIAGNOSIS — A048 Other specified bacterial intestinal infections: Secondary | ICD-10-CM

## 2017-03-14 DIAGNOSIS — Z8249 Family history of ischemic heart disease and other diseases of the circulatory system: Secondary | ICD-10-CM | POA: Diagnosis not present

## 2017-03-14 DIAGNOSIS — E78019 Familial hypercholesterolemia, unspecified: Secondary | ICD-10-CM

## 2017-03-14 DIAGNOSIS — E7801 Familial hypercholesterolemia: Secondary | ICD-10-CM

## 2017-03-14 DIAGNOSIS — Z0000 Encounter for general adult medical examination without abnormal findings: Secondary | ICD-10-CM

## 2017-03-14 NOTE — Patient Instructions (Signed)

## 2017-03-14 NOTE — Progress Notes (Signed)
Subjective:   Whitney Donaldson is a 83 y.o. female who presents for Medicare Annual (Subsequent) preventive examination.  Last AWV-03/27/15    Objective:     Vitals: BP (!) 138/50 (BP Location: Right Arm, Patient Position: Sitting)   Pulse 84   Temp 98.2 F (36.8 C) (Oral)   Ht 5\' 2"  (1.575 m)   Wt 101 lb (45.8 kg)   SpO2 97%   BMI 18.47 kg/m   Body mass index is 18.47 kg/m.   Tobacco History  Smoking Status  . Never Smoker  Smokeless Tobacco  . Never Used    Comment: NEVER USED TOBACCO     Counseling given: Not Answered   Past Medical History:  Diagnosis Date  . Arthritis    Bilateral hands  . Cancer ([place])    breast hx of, skin hx of  . Hyperlipidemia   . Nephrolithiasis     X1  . Osteopenia   . Positive H. pylori test 01/27/2017   Past Surgical History:  Procedure Laterality Date  . COLONOSCOPY  2004    negative; Dr Fuller Plan  . colonoscopy with polypectomy  2014  . mastestomy  2003   partial /left  . SKIN CANCER EXCISION     chest , Dr Ubaldo Glassing   Family History  Problem Relation Age of Onset  . Heart attack Mother 48  . Heart disease Mother   . Lung cancer Father        smoker  . COPD Father   . Cancer Father   . Stroke Sister 24  . Alzheimer's disease Sister   . Heart attack Brother 77  . Heart disease Brother   . Diabetes Neg Hx    History  Sexual Activity  . Sexual activity: Not Currently    Outpatient Encounter Prescriptions as of 03/14/2017  Medication Sig  . aspirin 81 MG tablet Take 81 mg by mouth daily.    . beta carotene w/minerals (OCUVITE) tablet Take 1 tablet by mouth daily.  . Calcium Carbonate-Vitamin D (CALCIUM + D PO) Take by mouth daily.   . Cholecalciferol (VITAMIN D3) 1000 UNITS CAPS Take by mouth 2 (two) times daily.   . Coenzyme Q10 (COQ10 PO) Take 1 tablet by mouth daily.  . cyanocobalamin 500 MCG tablet Take 500 mcg by mouth daily. Take 2 pills daily  . FOLIC ACID PO Take 694 mg by mouth daily.   . Multiple Vitamin  (MULTIVITAMIN) tablet Take 1 tablet by mouth daily.    . rosuvastatin (CRESTOR) 20 MG tablet Take 1 tablet by mouth at bedtime  . vitamin C (ASCORBIC ACID) 250 MG tablet Take 500 mg by mouth daily.   . Vitamin E 400 UNITS TABS Take by mouth daily.  . [DISCONTINUED] amoxicillin-clarithromycin-lansoprazole (PREVPAC) combo pack Take by mouth 2 (two) times daily. Follow package directions.   No facility-administered encounter medications on file as of 03/14/2017.     Activities of Daily Living In your present state of health, do you have any difficulty performing the following activities: 03/14/2017 11/10/2016  Hearing? N N  Vision? N N  Difficulty concentrating or making decisions? N N  Walking or climbing stairs? N N  Dressing or bathing? N N  Doing errands, shopping? N N  Preparing Food and eating ? N -  Using the Toilet? N -  In the past six months, have you accidently leaked urine? N -  Do you have problems with loss of bowel control? N -  Managing your  Medications? N -  Managing your Finances? N -  Housekeeping or managing your Housekeeping? N -  Some recent data might be hidden    Patient Care Team: Lauree Chandler, NP as PCP - General (Geriatric Medicine) Renette Butters, MD as Consulting Physician (Orthopedic Surgery) Rolm Bookbinder, MD as Consulting Physician (Dermatology) Kathie Rhodes, MD as Consulting Physician (Urology) Marin Olp Rudell Cobb, MD as Consulting Physician (Oncology)    Assessment:     Exercise Activities and Dietary recommendations Current Exercise Habits: The patient does not participate in regular exercise at present, Exercise limited by: None identified  Goals      Patient Stated   . patient (pt-stated)          Reduce stress by getting out more      Other   . Exercise 3x per week (30 min per time)          Patient will do more outside work and go to the gym using silver sneaker      Fall Risk Fall Risk  03/14/2017 01/25/2017 11/16/2016  11/10/2016 10/11/2016  Falls in the past year? Yes Yes Yes Yes No  Comment - - - - -  Number falls in past yr: 2 or more 1 1 1  -  Comment - May 2018 per patient, appx 2 weeks ago she "sat down hard" - -  Injury with Fall? Yes Yes Yes (No Data) -  Comment broken pelvic bones pelvic fracture per patient, muscle spasms began Not sure -   Depression Screen PHQ 2/9 Scores 03/14/2017 01/25/2017 11/16/2016 11/10/2016  PHQ - 2 Score 0 0 0 0     Cognitive Function MMSE - Mini Mental State Exam 03/14/2017  Orientation to time 5  Orientation to Place 5  Registration 3  Attention/ Calculation 5  Recall 3  Language- name 2 objects 2  Language- repeat 1  Language- follow 3 step command 3  Language- read & follow direction 1  Write a sentence 1  Copy design 1  Total score 30        Immunization History  Administered Date(s) Administered  . Influenza Split 04/07/2011  . Influenza Whole 06/01/2007, 04/04/2008, 04/10/2009  . Influenza, High Dose Seasonal PF 04/19/2014, 03/27/2015, 04/14/2016  . Pneumococcal Conjugate-13 12/05/2014  . Pneumococcal Polysaccharide-23 07/30/2011  . Tdap 08/22/2012  . Zoster 04/15/2011   Screening Tests Health Maintenance  Topic Date Due  . INFLUENZA VACCINE  01/26/2017  . TETANUS/TDAP  08/22/2022  . DEXA SCAN  Completed  . PNA vac Low Risk Adult  Completed      Plan:    I have personally reviewed and addressed the Medicare Annual Wellness questionnaire and have noted the following in the patient's chart:  A. Medical and social history B. Use of alcohol, tobacco or illicit drugs  C. Current medications and supplements D. Functional ability and status E.  Nutritional status F.  Physical activity G. Advance directives H. List of other physicians I.  Hospitalizations, surgeries, and ER visits in previous 12 months J.  [place] to include hearing, vision, cognitive, depression L. Referrals and appointments - none  In addition, I have  reviewed and discussed with patient certain preventive protocols, quality metrics, and best practice recommendations. A written personalized care plan for preventive services as well as general preventive health recommendations were provided to patient.  See attached scanned questionnaire for additional information.   Signed,   Rich Reining, RN Nurse Health Advisor   Quick Notes  Health Maintenance: Flu vaccine given.     Abnormal Screen: MMSE 30/30. Passed clock drawing     Patient Concerns: None     Nurse Concerns: None

## 2017-03-14 NOTE — Progress Notes (Signed)
Provider: Lauree Chandler, NP  Patient Care Team: Lauree Chandler, NP as PCP - General (Geriatric Medicine) Renette Butters, MD as Consulting Physician (Orthopedic Surgery) Rolm Bookbinder, MD as Consulting Physician (Dermatology) Kathie Rhodes, MD as Consulting Physician (Urology) Marin Olp Rudell Cobb, MD as Consulting Physician (Oncology)  Extended Emergency Contact Information Primary Emergency Contact: Dibert,Loyd Address: [address]          [place] [address] Johnnette Litter of [place] Phone: [phone] Relation: Spouse Allergies  Allergen Reactions  . Oxycodone-Acetaminophen     REACTION: nausea  . Tramadol     Nausea    Code Status: DNR Goals of Care: Advanced Directive information Advanced Directives 03/14/2017  Does Patient Have a Medical Advance Directive? No  Type of Advance Directive -  Does patient want to make changes to medical advance directive? -  Copy of [place] in Chart? -  Would patient like information on creating a medical advance directive? -     Chief Complaint  Patient presents with  . Medical Management of Chronic Issues    Pt is being for a complete physical and management of chronic conditions.     HPI: Patient is a 83 y.o. female seen in today for an annual wellness exam.   Major illnesses or hospitalization in the last year: none Finished treatment for  H. pylori which no recurrent symptoms.    Depression screen [facility] 2/9 03/14/2017 01/25/2017 11/16/2016 11/10/2016 10/11/2016  Decreased Interest 0 0 0 0 0  Down, Depressed, Hopeless 0 0 0 0 0  PHQ - 2 Score 0 0 0 0 0    Fall Risk  03/14/2017 01/25/2017 11/16/2016 11/10/2016 10/11/2016  Falls in the past year? Yes Yes Yes Yes No  Comment - - - - -  Number falls in past yr: 2 or more 1 1 1  -  Comment - May 2018 per patient, appx 2 weeks ago she "sat down hard" - -  Injury with Fall? Yes Yes Yes (No Data) -  Comment broken pelvic bones pelvic fracture per  patient, muscle spasms began Not sure -   dexa scan revealed osteopenia. Recommended to take caltrate with D 600/400 twice a day and weight bearing activity.  Hx of pelvic fracture after a fall.     MMSE - Mini Mental State Exam 03/14/2017  Orientation to time 5  Orientation to Place 5  Registration 3  Attention/ Calculation 5  Recall 3  Language- name 2 objects 2  Language- repeat 1  Language- follow 3 step command 3  Language- read & follow direction 1  Write a sentence 1  Copy design 1  Total score 30     Health Maintenance  Topic Date Due  . TETANUS/TDAP  08/22/2022  . INFLUENZA VACCINE  Completed  . DEXA SCAN  Completed  . PNA vac Low Risk Adult  Completed    Urologist- overactive bladder and kidney stone Hx of breast cancer- Dr Marin Olp does yearly breast exam and checks.  Does yearly skin assessment at dermatologist Eye exam yearly- Dr Marica Otter.  Dentist- twice yearly  Husband with dementia- disease is progressing quickly and she reports a lot of changes in personality as well.   Past Medical History:  Diagnosis Date  . Arthritis    Bilateral hands  . Cancer ([place])    breast hx of, skin hx of  . Hyperlipidemia   . Nephrolithiasis     X1  . Osteopenia   .  Positive H. pylori test 01/27/2017    Past Surgical History:  Procedure Laterality Date  . COLONOSCOPY  2004    negative; Dr Fuller Plan  . colonoscopy with polypectomy  2014  . mastestomy  2003   partial /left  . SKIN CANCER EXCISION     chest , Dr Ubaldo Glassing    Social History   Social History  . Marital status: Married    Spouse name: N/A  . Number of children: N/A  . Years of education: N/A   Social History Main Topics  . Smoking status: Never Smoker  . Smokeless tobacco: Never Used     Comment: NEVER USED TOBACCO  . Alcohol use No  . Drug use: No  . Sexual activity: Not Currently   Other Topics Concern  . None   Social History Narrative   Social History      Diet? Healthy diet       Do you drink/eat things with caffeine? Very little      Marital status?            married                        What year were you married? 1957      Do you live in a house, apartment, assisted living, condo, trailer, etc.? house      Is it one or more stories? 2      How many persons live in your home? 2      Do you have any pets in your home? (please list) dog and cat      Highest level of education completed? High school      Current or past profession: Architect      Advanced Directives      Do you exercise?         Very little                             Type & how often? walk      Do you have a living will? yes      Do you have a DNR form?       no                           If not, do you want to discuss one? no      Do you have signed POA/HPOA for forms? yes      Functional Status      Do you have difficulty bathing or dressing yourself? no      Do you have difficulty preparing food or eating? no      Do you have difficulty managing your medications? no      Do you have difficulty managing your finances? no      Do you have difficulty affording your medications? no       Family History  Problem Relation Age of Onset  . Heart attack Mother 71  . Heart disease Mother   . Lung cancer Father        smoker  . COPD Father   . Cancer Father   . Stroke Sister 37  . Alzheimer's disease Sister   . Heart attack Brother 74  . Heart disease Brother   . Diabetes Neg Hx     Review of Systems:  Review of Systems  Constitutional:  Negative for activity change, appetite change, fatigue and unexpected weight change.  HENT: Negative for congestion and hearing loss.   Eyes: Negative.   Respiratory: Negative for cough and shortness of breath.   Cardiovascular: Negative for chest pain, palpitations and leg swelling.  Gastrointestinal: Negative for abdominal pain, constipation and diarrhea.  Genitourinary: Negative for difficulty urinating and  dysuria.  Musculoskeletal: Negative for arthralgias and myalgias.  Skin: Negative for color change and wound.  Neurological: Negative for dizziness, weakness, light-headedness and headaches.  Psychiatric/Behavioral: Negative for agitation, behavioral problems and confusion.     Allergies as of 03/14/2017      Reactions   Oxycodone-acetaminophen    REACTION: nausea   Tramadol    Nausea       Medication List       Accurate as of 03/14/17  1:39 PM. Always use your most recent med list.          aspirin 81 MG tablet Take 81 mg by mouth daily.   beta carotene w/minerals tablet Take 1 tablet by mouth daily.   CALCIUM + D PO Take by mouth daily.   COQ10 PO Take 1 tablet by mouth daily.   cyanocobalamin 500 MCG tablet Take 500 mcg by mouth daily. Take 2 pills daily   FOLIC ACID PO Take 578 mg by mouth daily.   multivitamin tablet Take 1 tablet by mouth daily.   rosuvastatin 20 MG tablet Commonly known as:  CRESTOR Take 1 tablet by mouth at bedtime   vitamin C 250 MG tablet Commonly known as:  ASCORBIC ACID Take 500 mg by mouth daily.   Vitamin D3 1000 units Caps Take by mouth 2 (two) times daily.   Vitamin E 400 units Tabs Take by mouth daily.         Physical Exam: Vitals:   03/14/17 1331  BP: 132/72  Pulse: 84  Resp: 18  Temp: 98 F (36.7 C)  TempSrc: Oral  SpO2: 97%  Weight: 101 lb (45.8 kg)  Height: 5\' 2"  (1.575 m)   Body mass index is 18.47 kg/m. Physical Exam  Constitutional: She is oriented to person, place, and time. She appears well-developed and well-nourished. No distress.  Thin female, NAD  HENT:  Head: Normocephalic and atraumatic.  Mouth/Throat: Oropharynx is clear and moist. No oropharyngeal exudate.  Eyes: Pupils are equal, round, and reactive to light. Conjunctivae are normal.  Neck: Normal range of motion. Neck supple.  Cardiovascular: Normal rate, regular rhythm and normal heart sounds.   Pulmonary/Chest: Effort normal  and breath sounds normal.  Abdominal: Soft. Bowel sounds are normal. She exhibits no distension. There is no tenderness. There is no guarding.  Musculoskeletal: She exhibits no edema or tenderness.  Neurological: She is alert and oriented to person, place, and time.  Skin: Skin is warm and dry. She is not diaphoretic.  Psychiatric: She has a normal mood and affect.    Labs reviewed: Basic Metabolic Panel:  Recent Labs  12/17/16 0819 01/25/17 1020  NA 142 141  K 4.3 4.5  CL 103 104  CO2 29 25  GLUCOSE 97 86  BUN 14 22  CREATININE 0.9 0.66  CALCIUM 9.6 9.2  TSH  --  1.42   Liver Function Tests:  Recent Labs  12/17/16 0819 01/25/17 1020  AST 27 18  ALT 30 15  ALKPHOS 116* 82  BILITOT 0.90 0.4  PROT 7.4 6.4  ALBUMIN 3.9 4.1    Recent Labs  01/25/17 1020  LIPASE 17  AMYLASE 53   No results for input(s): AMMONIA in the last 8760 hours. CBC:  Recent Labs  10/11/16 1107 12/17/16 0819 01/25/17 1020  WBC 6.1 5.5 4.4  NEUTROABS  --  4.0 3,124  HGB 13.0 13.6 13.2  HCT 38.5 41.5 40.3  MCV 93.4 95 94.4  PLT  --  191 178   Lipid Panel:  Recent Labs  01/25/17 1020  CHOL 198  HDL 68  LDLCALC 114*  TRIG 79  CHOLHDL 2.9   Lab Results  Component Value Date   HGBA1C 6.2 11/29/2013    Procedures: Dg Bone Density  Result Date: 02/21/2017 EXAM: DUAL X-RAY ABSORPTIOMETRY (DXA) FOR BONE MINERAL DENSITY IMPRESSION: Referring Physician:  Lauree Chandler PATIENT: Name: Avia, Merkley Patient ID: [id number] Birth Date: 17-Sep-1932 Height: 62.0 in. Sex: Female Measured: 02/21/2017 Weight: 101.1 lbs. Indications: Advanced Age, Breast Cancer History, Caucasian, Estrogen Deficient, Postmenopausal Fractures: Pelvis Treatments: Calcium (E943.0), Vitamin D (E933.5) ASSESSMENT: The BMD measured at Femur Total Left is 0.787 g/cm2 with a T-score of -1.8. This patient is considered osteopenic according to [facility]) criteria. L-3 and L-4 were excluded  due to degenerative changes. Per the official positions of the ISCD, it is not possible to quantitatively compare BMD or calculate an [facility] between exams done at different facilities. Site Region Measured Date Measured Age YA BMD Significant CHANGE T-score DualFemur Total Left 02/21/2017    82.5         -1.8    0.787 g/cm2 AP Spine  L1-L2      4/21/2017    [age]         -1.6    0.981 g/cm2 World Health Organization [facility]) criteria for post-menopausal, Caucasian Women: Normal       T-score at or above -1 SD Osteopenia   T-score between -1 and -2.5 SD Osteoporosis T-score at or below -2.5 SD RECOMMENDATION: [place] recommends that FDA-approved medical therapies be considered in postmenopausal women and men age 91 or older with a: 1. Hip or vertebral (clinical or morphometric) fracture. 2. T-score of <-2.5 at the spine or hip. 3. Ten-year fracture probability by FRAX of 3% or greater for hip fracture or 20% or greater for major osteoporotic fracture. All treatment decisions require clinical judgment and consideration of individual patient factors, including patient preferences, co-morbidities, previous drug use, risk factors not captured in the FRAX model (e.g. falls, vitamin D deficiency, increased bone turnover, interval significant decline in bone density) and possible under - or over-estimation of fracture risk by FRAX. All patients should ensure an adequate intake of dietary calcium (1200 mg/d) and vitamin D (800 IU daily) unless contraindicated. FOLLOW-UP: People with diagnosed cases of osteoporosis or at high risk for fracture should have regular bone mineral density tests. For patients eligible for Medicare, routine testing is allowed once every 2 years. The testing frequency can be increased to one year for patients who have rapidly progressing disease, those who are receiving or discontinuing medical therapy to restore bone mass, or have additional risk factors. I have reviewed this  report, and agree with the above findings. [facility] FRAX* 10-year Probability of Fracture Based on femoral neck BMD: DualFemur (Right) Major Osteoporotic Fracture: 9.3% Hip Fracture:                2.3% Population:                  [place] (Caucasian) Risk Factors:  None ASSESSMENT: The probability of a major osteoporotic fracture is 9.3 % within the next ten years. The probability of a hip fracture is 2.3 % within the next ten years. Electronically Signed   By: Franki Cabot M.D.   On: 02/21/2017 10:42    Assessment/Plan 1. Wellness examination The patient was counseled regarding regular self-examination of the breasts on a monthly basis (following with oncology due to hx of breast cancer), prevention of dental and periodontal disease, diet, regular sustained exercise for at least 30 minutes 5 times per week also to work on core strength for balance and going to silver sneakers. routine screening interval for mammogram as recommended by the [place] and ACOG, and recommended schedule for GI hemoccult testing, colonoscopy, cholesterol, thyroid and diabetes screening.  2. Family history of heart attack -EKG- normal sinus rhythm rate 77  3.  Osteopenia, unspecified location -with fracture of sacrum. Recommended to take caltrate with D 600/400 twice a day with weight bearing activities. Recommend to take prolia every 6 months to prevent future fracture.   4. H. pylori infection Completed treatment and without further symptoms. Has actually gained a lb since treatment.   5. Familial hypercholesterolemia -conts on Crestor 20 mg daily with diet modifications, LDL 114.    Next appt: 6 months, sooner if needed  Roberta Angell K. Harle Battiest  [facility] [phone] 8 am - 5 pm) [phone] (after hours)

## 2017-03-14 NOTE — Patient Instructions (Addendum)
[name] , Thank you for taking time to come for your Medicare Wellness Visit. I appreciate your ongoing commitment to your health goals. Please review the following plan we discussed and let me know if I can assist you in the future.   Screening recommendations/referrals: Colonoscopy up to date, you are over age 81 Mammogram up to date, you are over age 43 Bone Density up to date Recommended yearly ophthalmology/optometry visit for glaucoma screening and checkup Recommended yearly dental visit for hygiene and checkup  Vaccinations: Influenza vaccine given today Pneumococcal vaccine up to date Tdap vaccine up to date. Due 08/22/2022 Shingles vaccine due, you have the prescription at home.  Advanced directives: Advance directive discussed with you today. Please bring a copy in to our office so we can scan it into your chart.   Conditions/risks identified: None  Next appointment: [name], NP 03/14/17 @ 1:30pm   Preventive Care 65 Years and Older, Female Preventive care refers to lifestyle choices and visits with your health care provider that can promote health and wellness. What does preventive care include?  A yearly physical exam. This is also called an annual well check.  Dental exams once or twice a year.  Routine eye exams. Ask your health care provider how often you should have your eyes checked.  Personal lifestyle choices, including:  Daily care of your teeth and gums.  Regular physical activity.  Eating a healthy diet.  Avoiding tobacco and drug use.  Limiting alcohol use.  Practicing safe sex.  Taking low-dose aspirin every day.  Taking vitamin and mineral supplements as recommended by your health care provider. What happens during an annual well check? The services and screenings done by your health care provider during your annual well check will depend on your age, overall health, lifestyle risk factors, and family history of  disease. Counseling  Your health care provider may ask you questions about your:  Alcohol use.  Tobacco use.  Drug use.  Emotional well-being.  Home and relationship well-being.  Sexual activity.  Eating habits.  History of falls.  Memory and ability to understand (cognition).  Work and work Statistician.  Reproductive health. Screening  You may have the following tests or measurements:  Height, weight, and BMI.  Blood pressure.  Lipid and cholesterol levels. These may be checked every 5 years, or more frequently if you are over 67 years old.  Skin check.  Lung cancer screening. You may have this screening every year starting at age 74 if you have a 30-pack-year history of smoking and currently smoke or have quit within the past 15 years.  Fecal occult blood test (FOBT) of the stool. You may have this test every year starting at age 10.  Flexible sigmoidoscopy or colonoscopy. You may have a sigmoidoscopy every 5 years or a colonoscopy every 10 years starting at age 30.  Hepatitis C blood test.  Hepatitis B blood test.  Sexually transmitted disease (STD) testing.  Diabetes screening. This is done by checking your blood sugar (glucose) after you have not eaten for a while (fasting). You may have this done every 1-3 years.  Bone density scan. This is done to screen for osteoporosis. You may have this done starting at age 42.  Mammogram. This may be done every 1-2 years. Talk to your health care provider about how often you should have regular mammograms. Talk with your health care provider about your test results, treatment options, and if necessary, the need for more tests. Vaccines  Your health care provider may recommend certain vaccines, such as:  Influenza vaccine. This is recommended every year.  Tetanus, diphtheria, and acellular pertussis (Tdap, Td) vaccine. You may need a Td booster every 10 years.  Zoster vaccine. You may need this after age  74.  Pneumococcal 13-valent conjugate (PCV13) vaccine. One dose is recommended after age 19.  Pneumococcal polysaccharide (PPSV23) vaccine. One dose is recommended after age 2. Talk to your health care provider about which screenings and vaccines you need and how often you need them. This information is not intended to replace advice given to you by your health care provider. Make sure you discuss any questions you have with your health care provider. Document Released: 07/11/2015 Document Revised: 03/03/2016 Document Reviewed: 04/15/2015 Elsevier Interactive Patient Education  2017 [place] Prevention in the Home Falls can cause injuries. They can happen to people of all ages. There are many things you can do to make your home safe and to help prevent falls. What can I do on the outside of my home?  Regularly fix the edges of walkways and driveways and fix any cracks.  Remove anything that might make you trip as you walk through a door, such as a raised step or threshold.  Trim any bushes or trees on the path to your home.  Use bright outdoor lighting.  Clear any walking paths of anything that might make someone trip, such as rocks or tools.  Regularly check to see if handrails are loose or broken. Make sure that both sides of any steps have handrails.  Any raised decks and porches should have guardrails on the edges.  Have any leaves, snow, or ice cleared regularly.  Use sand or salt on walking paths during winter.  Clean up any spills in your garage right away. This includes oil or grease spills. What can I do in the bathroom?  Use night lights.  Install grab bars by the toilet and in the tub and shower. Do not use towel bars as grab bars.  Use non-skid mats or decals in the tub or shower.  If you need to sit down in the shower, use a plastic, non-slip stool.  Keep the floor dry. Clean up any water that spills on the floor as soon as it happens.  Remove  soap buildup in the tub or shower regularly.  Attach bath mats securely with double-sided non-slip rug tape.  Do not have throw rugs and other things on the floor that can make you trip. What can I do in the bedroom?  Use night lights.  Make sure that you have a light by your bed that is easy to reach.  Do not use any sheets or blankets that are too big for your bed. They should not hang down onto the floor.  Have a firm chair that has side arms. You can use this for support while you get dressed.  Do not have throw rugs and other things on the floor that can make you trip. What can I do in the kitchen?  Clean up any spills right away.  Avoid walking on wet floors.  Keep items that you use a lot in easy-to-reach places.  If you need to reach something above you, use a strong step stool that has a grab bar.  Keep electrical cords out of the way.  Do not use floor polish or wax that makes floors slippery. If you must use wax, use non-skid floor wax.  Do  not have throw rugs and other things on the floor that can make you trip. What can I do with my stairs?  Do not leave any items on the stairs.  Make sure that there are handrails on both sides of the stairs and use them. Fix handrails that are broken or loose. Make sure that handrails are as long as the stairways.  Check any carpeting to make sure that it is firmly attached to the stairs. Fix any carpet that is loose or worn.  Avoid having throw rugs at the top or bottom of the stairs. If you do have throw rugs, attach them to the floor with carpet tape.  Make sure that you have a light switch at the top of the stairs and the bottom of the stairs. If you do not have them, ask someone to add them for you. What else can I do to help prevent falls?  Wear shoes that:  Do not have high heels.  Have rubber bottoms.  Are comfortable and fit you well.  Are closed at the toe. Do not wear sandals.  If you use a  stepladder:  Make sure that it is fully opened. Do not climb a closed stepladder.  Make sure that both sides of the stepladder are locked into place.  Ask someone to hold it for you, if possible.  Clearly mark and make sure that you can see:  Any grab bars or handrails.  First and last steps.  Where the edge of each step is.  Use tools that help you move around (mobility aids) if they are needed. These include:  Canes.  Walkers.  Scooters.  Crutches.  Turn on the lights when you go into a dark area. Replace any light bulbs as soon as they burn out.  Set up your furniture so you have a clear path. Avoid moving your furniture around.  If any of your floors are uneven, fix them.  If there are any pets around you, be aware of where they are.  Review your medicines with your doctor. Some medicines can make you feel dizzy. This can increase your chance of falling. Ask your doctor what other things that you can do to help prevent falls. This information is not intended to replace advice given to you by your health care provider. Make sure you discuss any questions you have with your health care provider. Document Released: 04/10/2009 Document Revised: 11/20/2015 Document Reviewed: 07/19/2014 Elsevier Interactive Patient Education  2017 [employer].

## 2017-04-26 ENCOUNTER — Ambulatory Visit: Payer: Medicare HMO | Admitting: Nurse Practitioner

## 2017-05-27 DIAGNOSIS — H59031 Cystoid macular edema following cataract surgery, right eye: Secondary | ICD-10-CM | POA: Diagnosis not present

## 2017-05-27 DIAGNOSIS — H5201 Hypermetropia, right eye: Secondary | ICD-10-CM | POA: Diagnosis not present

## 2017-05-27 DIAGNOSIS — H35341 Macular cyst, hole, or pseudohole, right eye: Secondary | ICD-10-CM | POA: Diagnosis not present

## 2017-05-27 DIAGNOSIS — H52223 Regular astigmatism, bilateral: Secondary | ICD-10-CM | POA: Diagnosis not present

## 2017-05-27 DIAGNOSIS — Z961 Presence of intraocular lens: Secondary | ICD-10-CM | POA: Diagnosis not present

## 2017-05-27 DIAGNOSIS — H5212 Myopia, left eye: Secondary | ICD-10-CM | POA: Diagnosis not present

## 2017-05-30 DIAGNOSIS — Z853 Personal history of malignant neoplasm of breast: Secondary | ICD-10-CM | POA: Diagnosis not present

## 2017-05-30 DIAGNOSIS — Z1231 Encounter for screening mammogram for malignant neoplasm of breast: Secondary | ICD-10-CM | POA: Diagnosis not present

## 2017-05-30 LAB — HM MAMMOGRAPHY

## 2017-05-31 ENCOUNTER — Encounter: Payer: Self-pay | Admitting: *Deleted

## 2017-06-13 DIAGNOSIS — H43813 Vitreous degeneration, bilateral: Secondary | ICD-10-CM | POA: Diagnosis not present

## 2017-06-13 DIAGNOSIS — H353122 Nonexudative age-related macular degeneration, left eye, intermediate dry stage: Secondary | ICD-10-CM | POA: Diagnosis not present

## 2017-06-13 DIAGNOSIS — H353211 Exudative age-related macular degeneration, right eye, with active choroidal neovascularization: Secondary | ICD-10-CM | POA: Diagnosis not present

## 2017-06-13 DIAGNOSIS — H01002 Unspecified blepharitis right lower eyelid: Secondary | ICD-10-CM | POA: Diagnosis not present

## 2017-06-23 ENCOUNTER — Telehealth: Payer: Self-pay | Admitting: Hematology & Oncology

## 2017-06-23 NOTE — Telephone Encounter (Signed)
Faxed medical records to: [facility] F: [phone] CHART ID: [id number] Dos: 06/29/2015-Present Whitney Donaldson [birth year]

## 2017-06-24 ENCOUNTER — Encounter: Payer: Self-pay | Admitting: Nurse Practitioner

## 2017-06-24 ENCOUNTER — Ambulatory Visit: Payer: Medicare HMO | Admitting: Nurse Practitioner

## 2017-06-24 VITALS — BP 128/78 | HR 80 | Temp 98.6°F | Resp 17 | Ht 62.0 in | Wt 100.0 lb

## 2017-06-24 DIAGNOSIS — B351 Tinea unguium: Secondary | ICD-10-CM

## 2017-06-24 DIAGNOSIS — T148XXA Other injury of unspecified body region, initial encounter: Secondary | ICD-10-CM | POA: Diagnosis not present

## 2017-06-24 DIAGNOSIS — H3530 Unspecified macular degeneration: Secondary | ICD-10-CM | POA: Diagnosis not present

## 2017-06-24 DIAGNOSIS — W19XXXA Unspecified fall, initial encounter: Secondary | ICD-10-CM

## 2017-06-24 NOTE — Patient Instructions (Addendum)
To use epsom salt soaks to foot if experiencing toe pain Would recommend podiatry cut toe nails.   To follow up with eye specialist

## 2017-06-24 NOTE — Progress Notes (Signed)
Careteam: Patient Care Team: Lauree Chandler, NP as PCP - General (Geriatric Medicine) Renette Butters, MD as Consulting Physician (Orthopedic Surgery) Rolm Bookbinder, MD as Consulting Physician (Dermatology) Kathie Rhodes, MD as Consulting Physician (Urology) Marin Olp Rudell Cobb, MD as Consulting Physician (Oncology)  Advanced Directive information Does Patient Have a Medical Advance Directive?: Yes, Type of Advance Directive: Out of facility DNR (pink MOST or yellow form), Pre-existing out of facility DNR order (yellow form or pink MOST form): Yellow form placed in chart (order not valid for inpatient use)  Allergies  Allergen Reactions  . Oxycodone-Acetaminophen     REACTION: nausea  . Tramadol     Nausea     Chief Complaint  Patient presents with  . Acute Visit    Pt is being seen due to tripping over curb about 6 days ago. Pt hit her face on pavement. Pt has knot on right forehead that is painful to the touch. Pt reports no headaches or dizziness.      HPI: Patient is a 83 y.o. female seen in the office today due to tripping on a curb 6 days ago. Hit her head and now concerned over a knot on her forehead. Kept ice on it on and off for several hours afterwards.  No headaches, no dizziness, no AMS or changes in LOC. December 17th she saw a specialist due to "wet macular degeneration" and he gave her a shot in her eye. Did not hit her eye but its black and blue surrounding.  Feels like something is in it today but did not apply lubricating drops which she normally does every morning. Was told only to use this for a few days but it seems like there is something in it when she does not use it, gets better after she uses the lubricant.  Goes back in 1 month for follow up.  No changes in vision.   Reports right great toenail has fungus and has pain at time. Took medication in the past but did not tolerate, toenail paints not effective. Her demented husband cuts her toenails  because she is unable. Sometimes causes her pain. None right now.  Review of Systems:  Review of Systems  Constitutional: Negative for chills, fever, malaise/fatigue and weight loss.  Eyes: Negative for blurred vision, double vision, photophobia, pain, discharge and redness.  Respiratory: Negative for cough, sputum production and shortness of breath.   Cardiovascular: Negative for chest pain, palpitations and leg swelling.  Gastrointestinal: Negative for abdominal pain, constipation, diarrhea, heartburn and nausea.  Genitourinary: Positive for frequency. Negative for dysuria and urgency.  Musculoskeletal: Positive for falls, joint pain and myalgias. Negative for back pain.  Skin: Negative.        Bruising noted to fall after fall  Neurological: Negative for dizziness, tingling, weakness and headaches.  Psychiatric/Behavioral: Negative for depression and memory loss. The patient does not have insomnia.     Past Medical History:  Diagnosis Date  . Arthritis    Bilateral hands  . Cancer ([place])    breast hx of, skin hx of  . Hyperlipidemia   . Nephrolithiasis     X1  . Osteopenia   . Positive H. pylori test 01/27/2017   Past Surgical History:  Procedure Laterality Date  . COLONOSCOPY  2004    negative; Dr Fuller Plan  . colonoscopy with polypectomy  2014  . mastestomy  2003   partial /left  . SKIN CANCER EXCISION     chest , Dr  Lomax   Social History:   reports that  has never smoked. she has never used smokeless tobacco. She reports that she does not drink alcohol or use drugs.  Family History  Problem Relation Age of Onset  . Heart attack Mother 74  . Heart disease Mother   . Lung cancer Father        smoker  . COPD Father   . Cancer Father   . Stroke Sister 36  . Alzheimer's disease Sister   . Heart attack Brother 28  . Heart disease Brother   . Diabetes Neg Hx     Medications:   Medication List        Accurate as of 06/24/17  8:42 AM. Always use your most recent  med list.          aspirin 81 MG tablet   beta carotene w/minerals tablet   CALCIUM + D PO   COQ10 PO   cyanocobalamin 563 MCG tablet   FOLIC ACID PO   multivitamin tablet   rosuvastatin 20 MG tablet Commonly known as:  CRESTOR Take 1 tablet by mouth at bedtime   vitamin C 250 MG tablet Commonly known as:  ASCORBIC ACID   Vitamin D3 1000 units Caps   Vitamin E 400 units Tabs        Physical Exam:  Vitals:   06/24/17 0834  BP: 128/78  Pulse: 80  Resp: 17  Temp: 98.6 F (37 C)  TempSrc: Oral  SpO2: 96%  Weight: 100 lb (45.4 kg)  Height: 5\' 2"  (1.575 m)   Body mass index is 18.29 kg/m.  Physical Exam  Constitutional: She is oriented to person, place, and time. She appears well-developed and well-nourished. No distress.  Thin female, NAD  HENT:  Head: Normocephalic.  Mouth/Throat: Oropharynx is clear and moist. No oropharyngeal exudate.  Small hematoma noted above right eye brow Abrasion noted to nose, no signs infection Bruising in multiple stages noted around bilateral eye an into checks and to chin.    Eyes: Conjunctivae, EOM and lids are normal. Pupils are equal, round, and reactive to light.  No foreign bodies noted to eye  Neck: Normal range of motion. Neck supple.  Cardiovascular: Normal rate, regular rhythm and normal heart sounds.  Pulmonary/Chest: Effort normal and breath sounds normal.  Abdominal: Soft. Bowel sounds are normal. She exhibits no distension. There is no tenderness. There is no guarding.  Musculoskeletal: She exhibits no edema or tenderness.  Neurological: She is alert and oriented to person, place, and time.  Skin: Skin is warm and dry. She is not diaphoretic.  Psychiatric: She has a normal mood and affect.    Labs reviewed: Basic Metabolic Panel: Recent Labs    12/17/16 0819 01/25/17 1020  NA 142 141  K 4.3 4.5  CL 103 104  CO2 29 25  GLUCOSE 97 86  BUN 14 22  CREATININE 0.9 0.66  CALCIUM 9.6 9.2  TSH  --   1.42   Liver Function Tests: Recent Labs    12/17/16 0819 01/25/17 1020  AST 27 18  ALT 30 15  ALKPHOS 116* 82  BILITOT 0.90 0.4  PROT 7.4 6.4  ALBUMIN 3.9 4.1   Recent Labs    01/25/17 1020  LIPASE 17  AMYLASE 53   No results for input(s): AMMONIA in the last 8760 hours. CBC: Recent Labs    10/11/16 1107 12/17/16 0819 01/25/17 1020  WBC 6.1 5.5 4.4  NEUTROABS  --  4.0  3,124  HGB 13.0 13.6 13.2  HCT 38.5 41.5 40.3  MCV 93.4 95 94.4  PLT  --  191 178   Lipid Panel: Recent Labs    01/25/17 1020  CHOL 198  HDL 68  LDLCALC 114*  TRIG 79  CHOLHDL 2.9   TSH: Recent Labs    01/25/17 1020  TSH 1.42   A1C: Lab Results  Component Value Date   HGBA1C 6.2 11/29/2013     Assessment/Plan 1. Hematoma Noted to forehead after fall. Educated pt this may take several months to resolve  2. Fall, initial encounter -pt with multiple falls in the past. Attempts to be careful and watch her footing. She tripped while out running errands. No witnesses but was aware she hit her head. Drove home and iced area. No changes in LOC or AMS. No headache or dizziness or blurred vision. Has been baseline since, actually hosted family for [place] and made an elaborated dinner.   3. Macular degeneration of right eye, unspecified type Plans to make appt to evaluation of eye today due to feeling like there is something in her eye without using lubrication which she was only told to use for a few days. No changes in vision noted.   4. Onychomycosis Did not tolerate PO medication and has used toe nail paints in the past.  Encourage epsom salt soaks if hurting and to have podiatry cut toenails   Next appt: as scheduled.  Carlos American. Harle Battiest  [facility] [phone] 8 am - 5 pm) [phone] (after hours)

## 2017-06-29 ENCOUNTER — Other Ambulatory Visit: Payer: Self-pay

## 2017-06-29 ENCOUNTER — Telehealth: Payer: Self-pay

## 2017-06-29 MED ORDER — ALENDRONATE SODIUM 70 MG PO TABS: 70.0000 mg | ORAL_TABLET | ORAL | 3 refills | Status: DC

## 2017-06-29 NOTE — Telephone Encounter (Signed)
I spoke with patient and she verbalized understanding. She would like to know if there are any other options for treatment?   Please advise.

## 2017-06-29 NOTE — Telephone Encounter (Signed)
I spoke with patient and she agreed to try medication. Rx was sent to pharmacy and medication list has been updated.

## 2017-06-29 NOTE — Telephone Encounter (Signed)
Patient dropped an envelope that had the following written on it:  "To Whitney Donaldson, From [facility], This is what I received from [place]. Let me know what I need to do, please."  The envelope contained a post card from [place] that patient could sign up for the Prolia Patient Support Program.   A letter was received at the office with the following message: Your patient's medical health plan advised that Prolia in not covered through your patient's medical benefit at this time or that the proposed use of Prolia is not consistent with the payor's policy and therefore is not covered."   I tried to call patient but the voicemail is not set up on her mobile phone and she was not at home per her husband. Will try again later

## 2017-06-29 NOTE — Telephone Encounter (Signed)
We could try fosamax 70 mg by mouth once weekly by mouth, to give 30 mins prior to first drink/food/medication and to avoid laying down for 30 mins after she takes this for osteoporosis, if she would like to start this okay to send to pharmacy of choice.

## 2017-06-29 NOTE — Telephone Encounter (Signed)
We could try fosamax 70 mg by mouth once weekly by mouth, to give 30 mins prior to first drink/food/medication and to avoid laying down for 30 mins after she takes this for osteoporosis, if she would like to start this okay to send to pharmacy of choice.   Medication list has been updated and Rx was sent to pharmacy electronically.

## 2017-07-14 DIAGNOSIS — H35371 Puckering of macula, right eye: Secondary | ICD-10-CM | POA: Diagnosis not present

## 2017-07-14 DIAGNOSIS — H43813 Vitreous degeneration, bilateral: Secondary | ICD-10-CM | POA: Diagnosis not present

## 2017-07-14 DIAGNOSIS — H353211 Exudative age-related macular degeneration, right eye, with active choroidal neovascularization: Secondary | ICD-10-CM | POA: Diagnosis not present

## 2017-07-14 DIAGNOSIS — H353122 Nonexudative age-related macular degeneration, left eye, intermediate dry stage: Secondary | ICD-10-CM | POA: Diagnosis not present

## 2017-07-19 ENCOUNTER — Other Ambulatory Visit: Payer: Self-pay

## 2017-07-19 MED ORDER — ZOSTER VAC RECOMB ADJUVANTED 50 MCG/0.5ML IM SUSR: 0.5000 mL | Freq: Once | INTRAMUSCULAR | 1 refills | Status: AC

## 2017-07-19 NOTE — Telephone Encounter (Signed)
Patient requested prescription for Shingrix. Rx was printed and signed by Janett Billow, then given to patient.

## 2017-08-02 ENCOUNTER — Other Ambulatory Visit: Payer: Self-pay | Admitting: Nurse Practitioner

## 2017-08-02 DIAGNOSIS — E782 Mixed hyperlipidemia: Secondary | ICD-10-CM

## 2017-08-18 DIAGNOSIS — H353211 Exudative age-related macular degeneration, right eye, with active choroidal neovascularization: Secondary | ICD-10-CM | POA: Diagnosis not present

## 2017-08-18 DIAGNOSIS — H35371 Puckering of macula, right eye: Secondary | ICD-10-CM | POA: Diagnosis not present

## 2017-08-18 DIAGNOSIS — H353122 Nonexudative age-related macular degeneration, left eye, intermediate dry stage: Secondary | ICD-10-CM | POA: Diagnosis not present

## 2017-08-18 DIAGNOSIS — H35341 Macular cyst, hole, or pseudohole, right eye: Secondary | ICD-10-CM | POA: Diagnosis not present

## 2017-08-27 IMAGING — CT CT ABD-PELV W/ CM
2 of 6 series · 16 of 46 positions shown, 18 images · IV contrast (iopamidol)
Comparison: 07/30/2004

CLINICAL DATA: Three-week history of left lower quadrant abdominal
pain.

EXAM:
CT ABDOMEN AND PELVIS WITH CONTRAST
TECHNIQUE: Multidetector CT imaging of the abdomen and pelvis was performed
using the standard protocol following bolus administration of
intravenous contrast.
CONTRAST:  80mL YYBHZM-1SS IOPAMIDOL (YYBHZM-1SS) INJECTION 61%

[Series 2: rtn a/p with · axial · 0.65mm/px · z∈[-426,-76]mm · 13 of 80 slices shown, 15 images]
[im 5/80  soft-tissue]
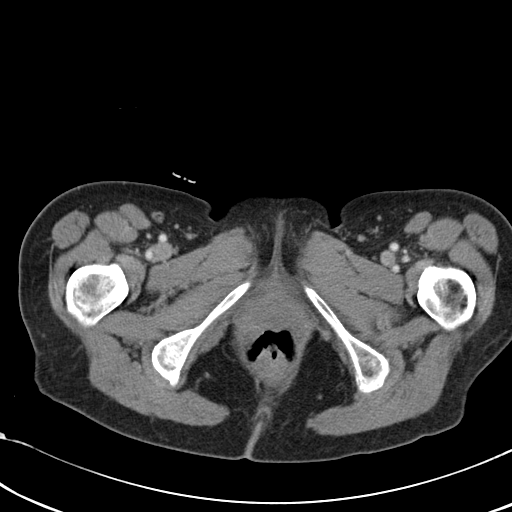
[im 5/80  bone]
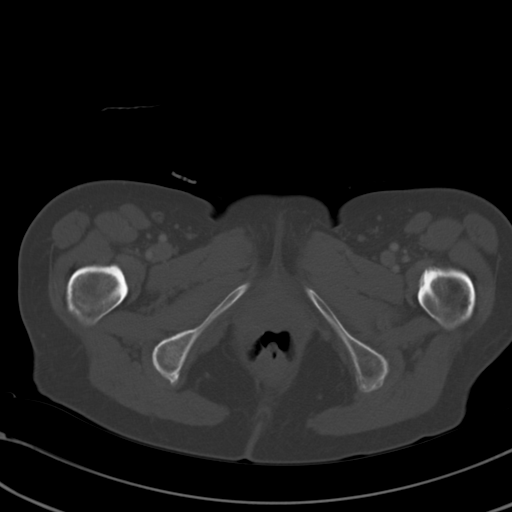
[im 10/80  soft-tissue]
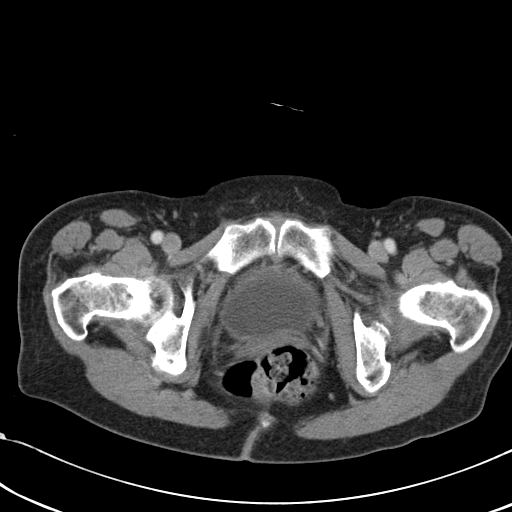
[im 15/80  soft-tissue]
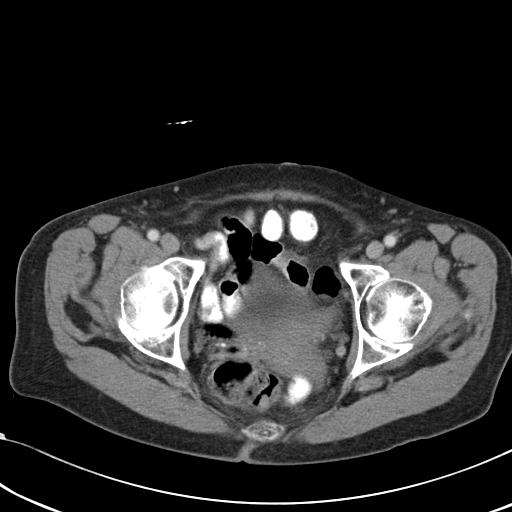
[im 25/80  soft-tissue]
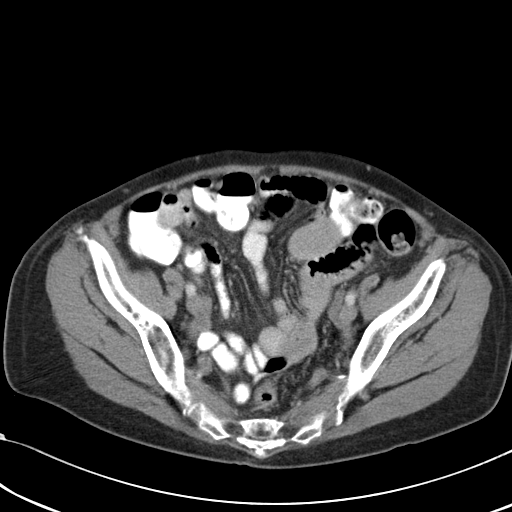
[im 30/80  soft-tissue]
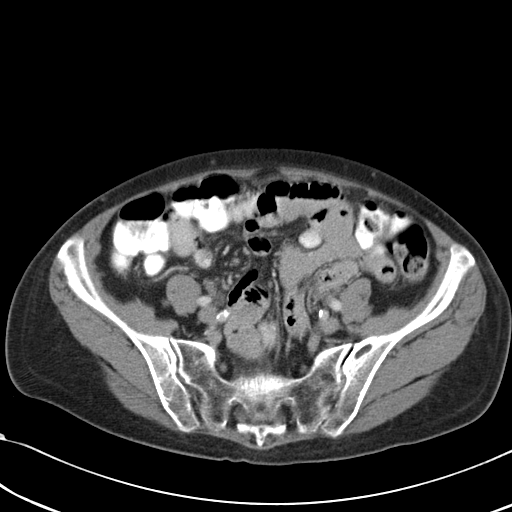
[im 35/80  soft-tissue]
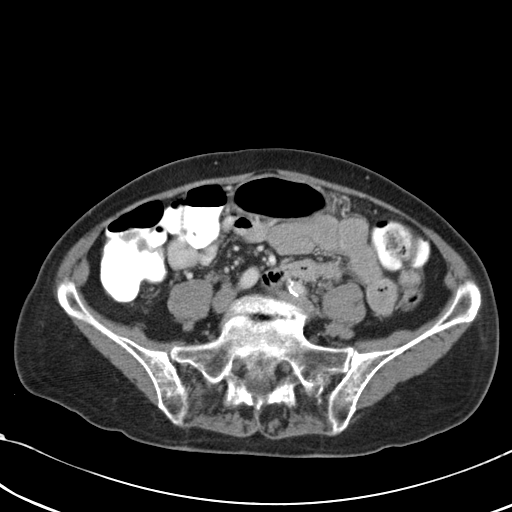
[im 40/80  soft-tissue]
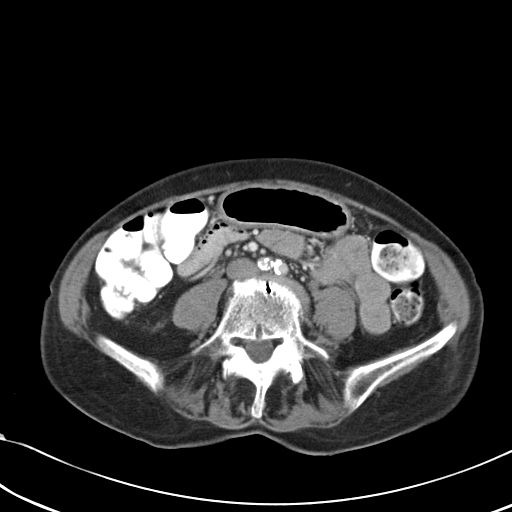
[im 45/80  soft-tissue]
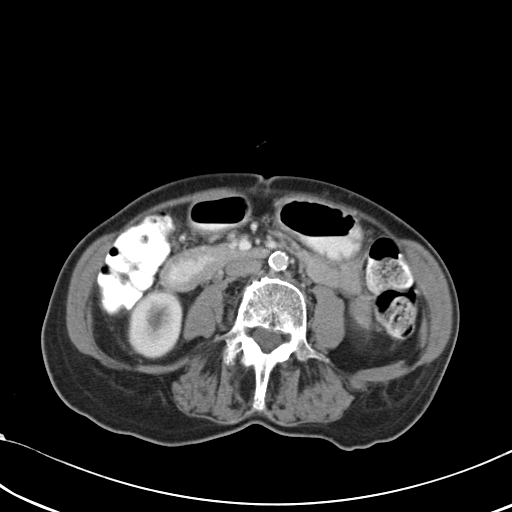
[im 50/80  soft-tissue]
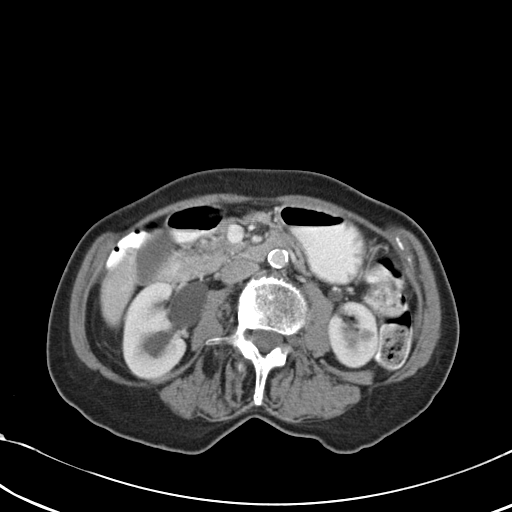
[im 50/80  bone]
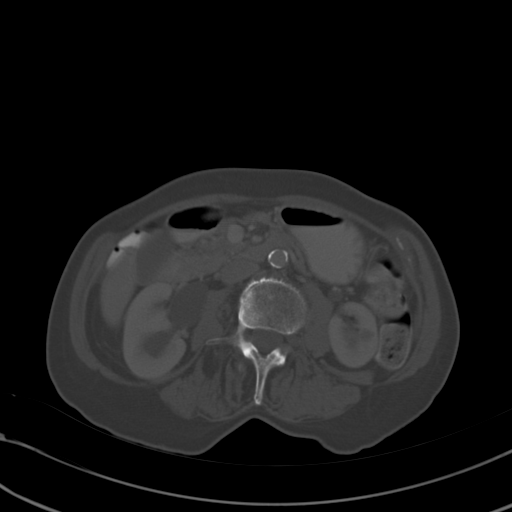
[im 55/80  soft-tissue]
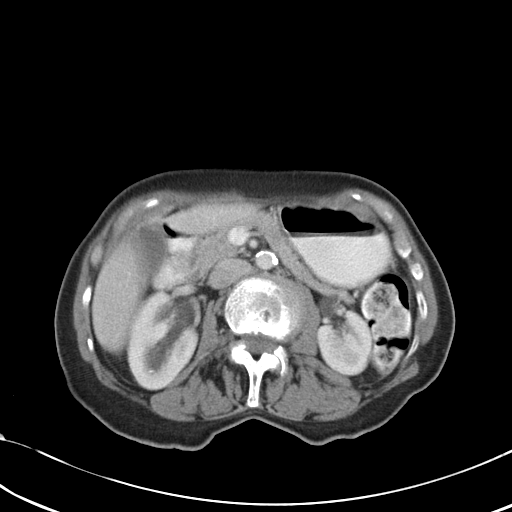
[im 65/80  soft-tissue]
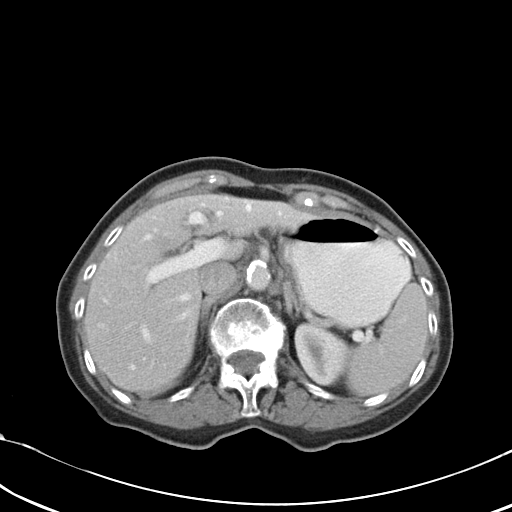
[im 70/80  soft-tissue]
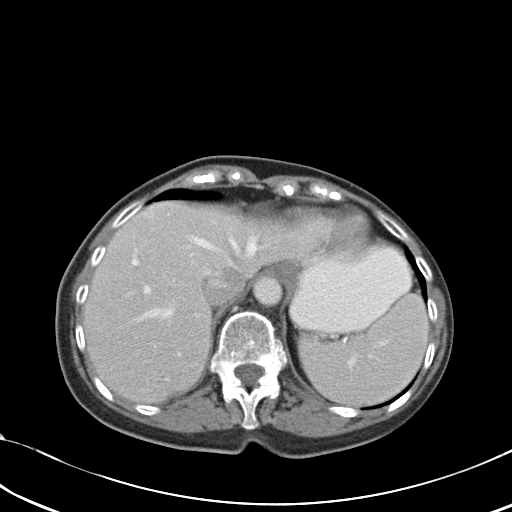
[im 75/80  soft-tissue]
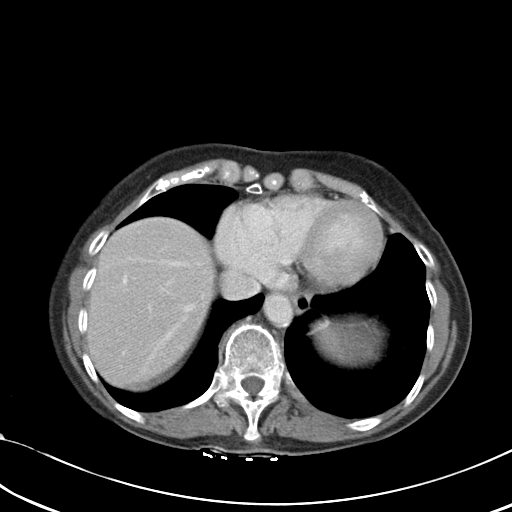

[Series 602: <mpr thick range> · coronal · 0.78mm/px · 3 of 102 slices shown]
[im 34/102  soft-tissue]
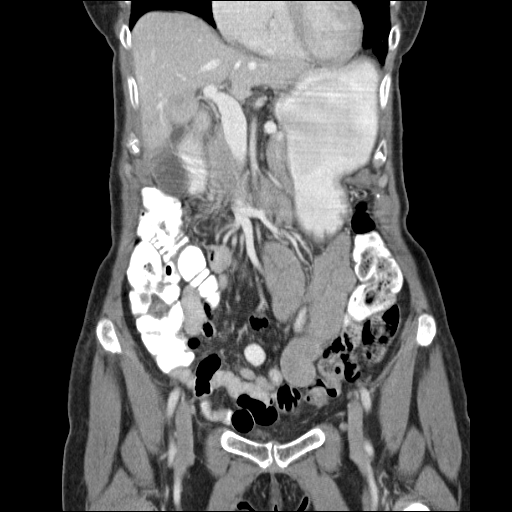
[im 45/102  soft-tissue]
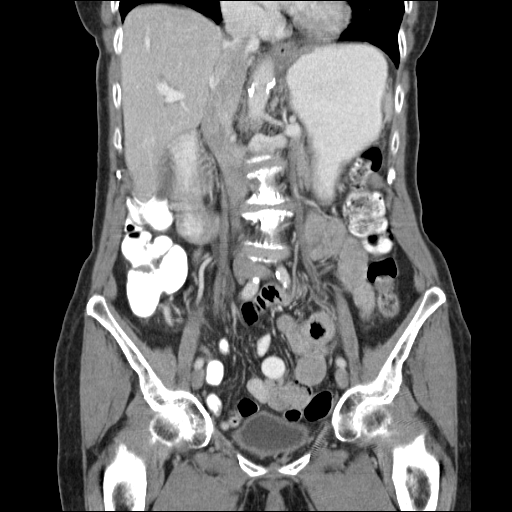
[im 57/102  soft-tissue]
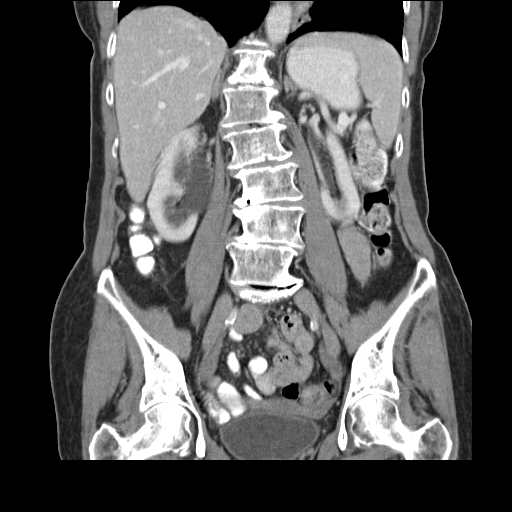

[16 of 46 positions shown; findings below may reference images not displayed]

FINDINGS: Lower chest: The lung bases are clear of acute process. No pleural
effusion. The heart is normal in size. Coronary artery
calcifications are noted. The distal esophagus is grossly normal.

Hepatobiliary: No focal hepatic lesions or intrahepatic biliary
dilatation. The gallbladder is normal. No common bile duct
dilatation.

Pancreas: No mass, inflammation or ductal dilatation.

Spleen: Normal size.  No focal lesions.

Adrenals/Urinary Tract: The adrenal glands are normal in stable.
Mild chronic right UPJ obstruction but normal renal cortical
thickness and normal perfusion. No obstructing ureteral calculi or
bladder calculi. There is a small calculus in the right kidney.

Stomach/Bowel: The stomach, duodenum, small bowel and colon are
grossly normal. No inflammatory changes, mass lesions or obstructive
findings. The terminal ileum is normal. The appendix is normal.

Vascular/Lymphatic: No mesenteric or retroperitoneal mass or
adenopathy. Small scattered lymph nodes are noted. Moderate
atherosclerotic calcifications involving the aorta and branch vessel
ostia.

Reproductive: The uterus and ovaries appear normal.

Other: No bladder mass or bladder calculi. No pelvic mass or
adenopathy. No free pelvic fluid collections. No inguinal mass or
adenopathy. No abdominal wall hernia or subcutaneous lesions.

Musculoskeletal: No significant bony findings. Moderate degenerative
changes involving the spine.
IMPRESSION: 1. Mild chronic right-sided UPJ obstruction. Right renal calculus
but no obstructing ureteral calculi or bladder calculi.
2. No acute abdominal/pelvic findings, mass lesions or adenopathy.
3. Moderate atherosclerotic calcifications involving the aorta and
branch vessels but no aneurysm or dissection.
4. Normal uterus and ovaries.

## 2017-08-27 IMAGING — DX DG ABDOMEN ACUTE W/ 1V CHEST
3 series · 3 of 3 positions shown · non-contrast
Comparison: Radiographs of May 08, 2015.

CLINICAL DATA: Lower abdominal pain for 3 weeks.

EXAM:
DG ABDOMEN ACUTE W/ 1V CHEST

[chest pa]
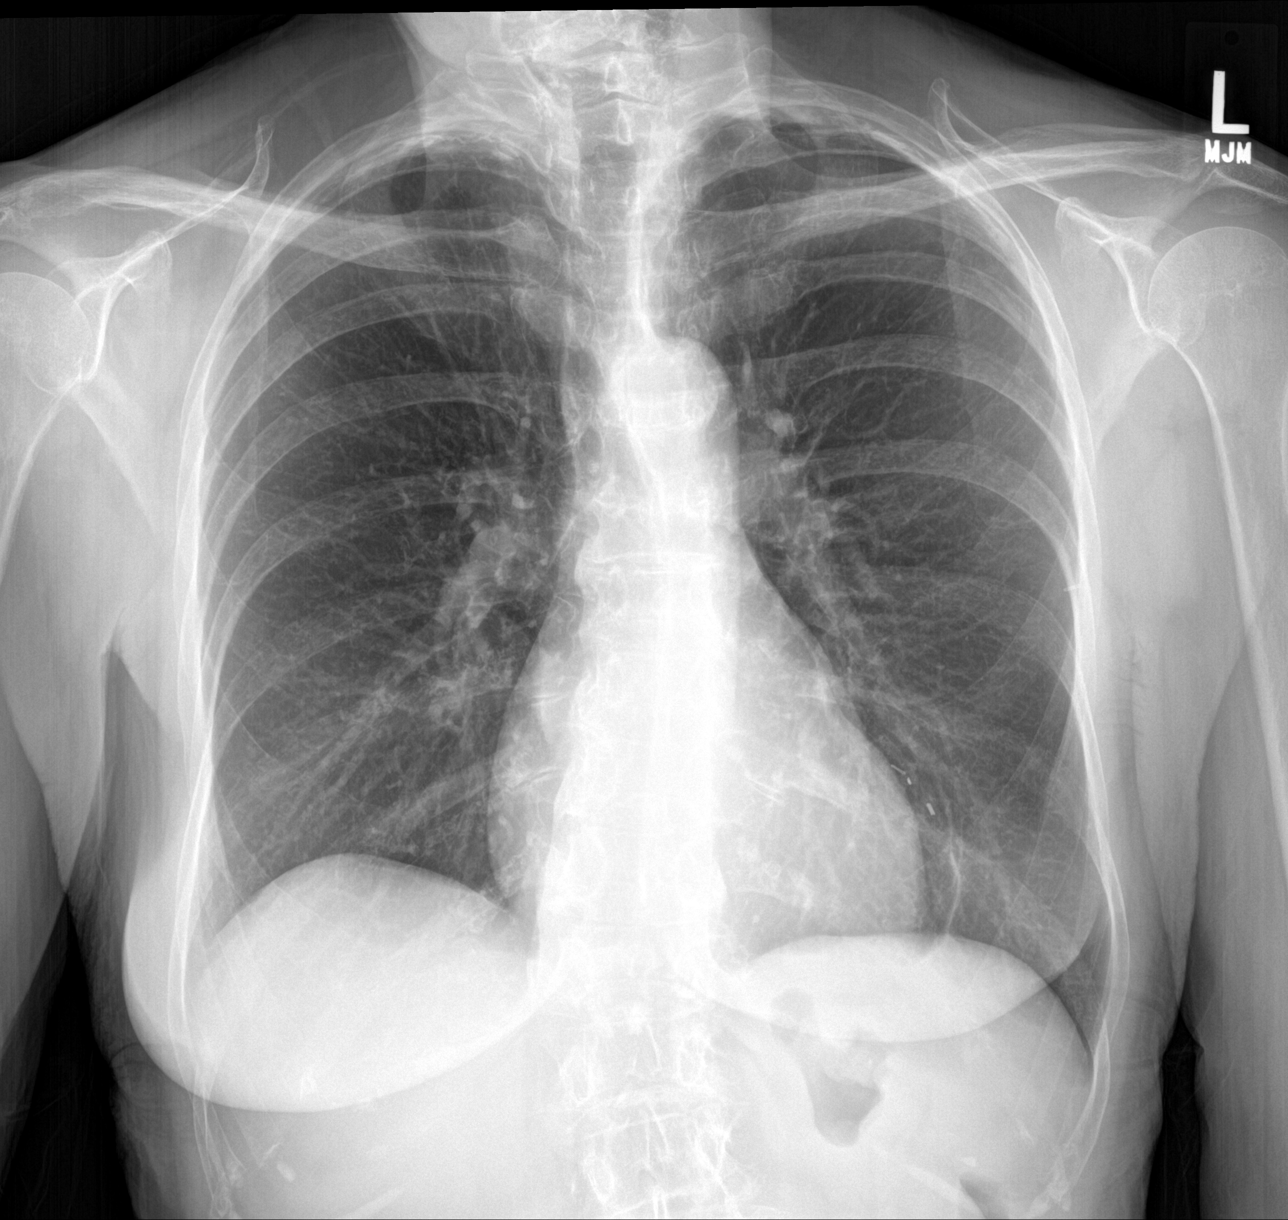

[abdomen erect]
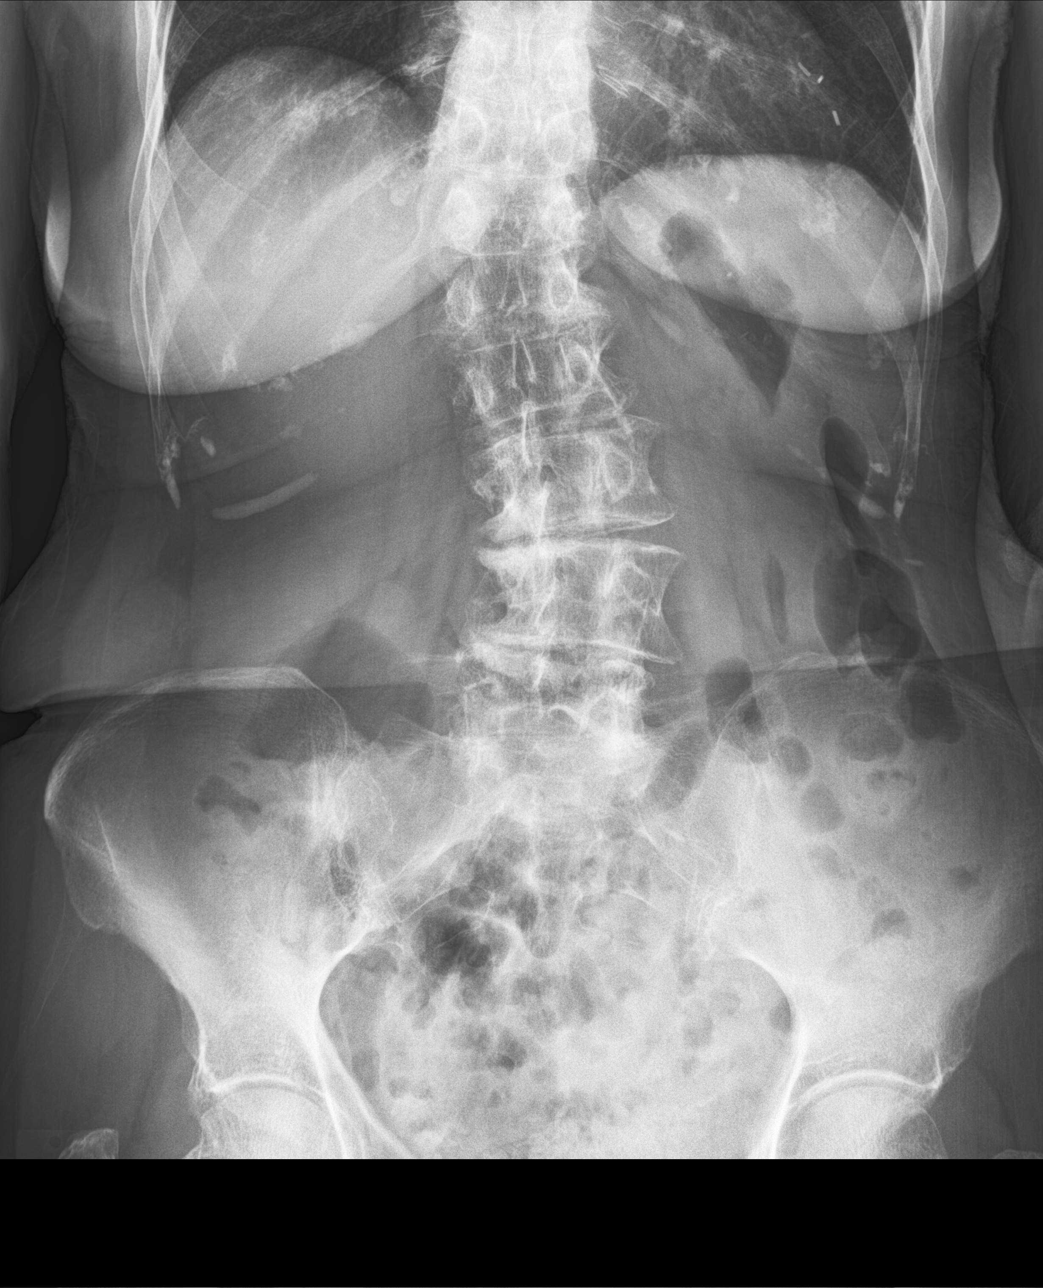

[abdomen supine]
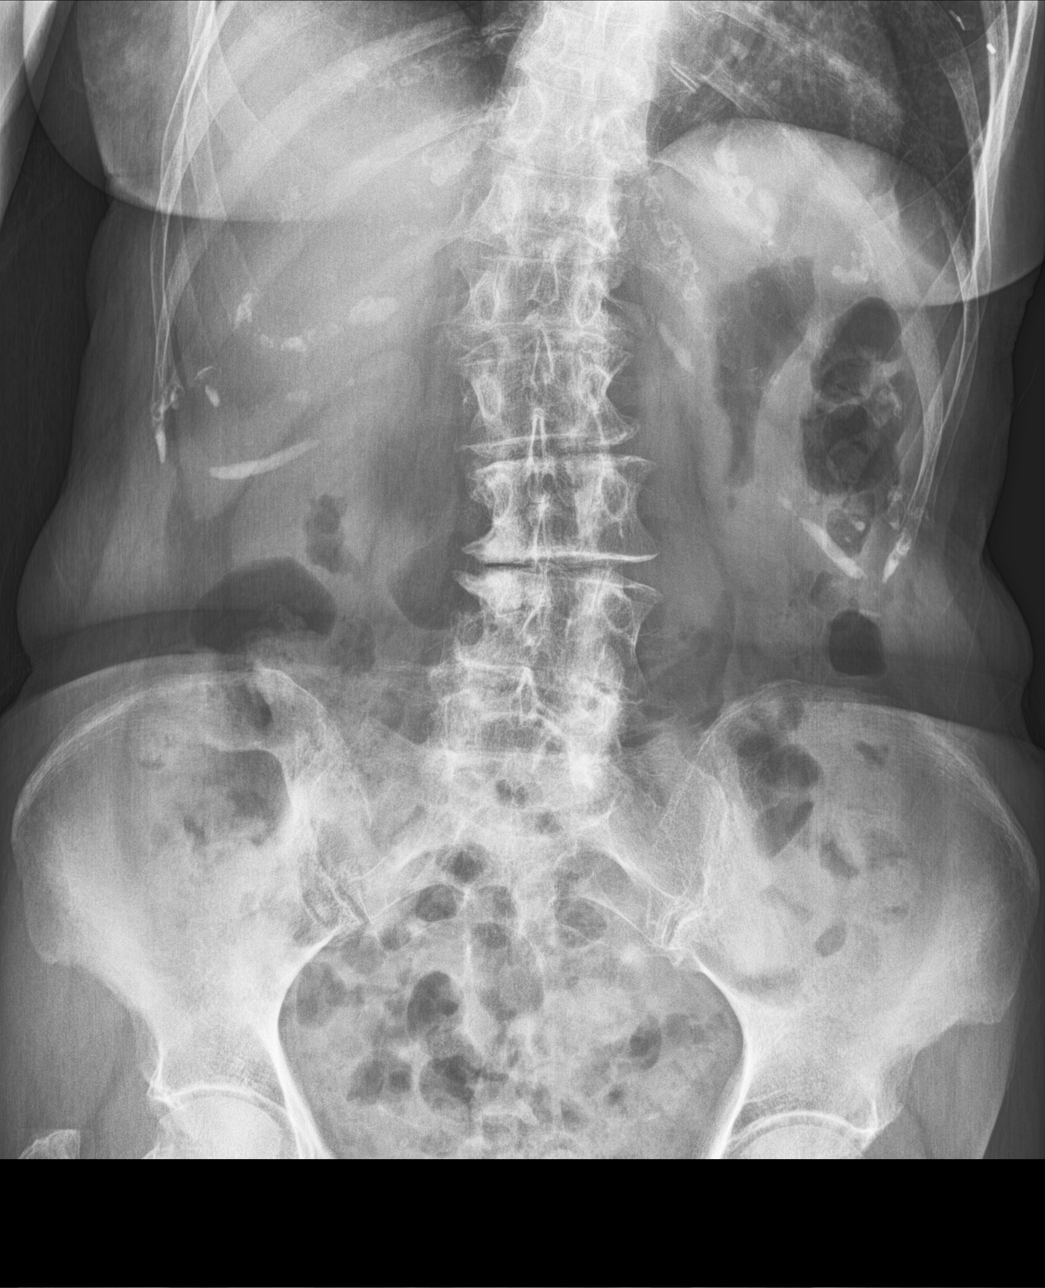

[3 of 3 positions shown; findings below may reference images not displayed]

FINDINGS: There is no evidence of dilated bowel loops or free intraperitoneal
air. No radiopaque calculi or other significant radiographic
abnormality is seen. Heart size and mediastinal contours are within
normal limits. Stable left basilar scarring or atelectasis is noted.
Otherwise lungs are clear.
IMPRESSION: There is no evidence of bowel obstruction or ileus. Stable left
basilar scarring or subsegmental atelectasis.

## 2017-09-08 ENCOUNTER — Encounter: Payer: Self-pay | Admitting: Internal Medicine

## 2017-09-08 ENCOUNTER — Ambulatory Visit (INDEPENDENT_AMBULATORY_CARE_PROVIDER_SITE_OTHER): Payer: Medicare HMO | Admitting: Internal Medicine

## 2017-09-08 VITALS — BP 118/68 | HR 83 | Temp 97.8°F | Ht 62.0 in | Wt 102.2 lb

## 2017-09-08 DIAGNOSIS — A048 Other specified bacterial intestinal infections: Secondary | ICD-10-CM

## 2017-09-08 DIAGNOSIS — R09A2 Foreign body sensation, throat: Secondary | ICD-10-CM

## 2017-09-08 DIAGNOSIS — R69 Illness, unspecified: Secondary | ICD-10-CM | POA: Diagnosis not present

## 2017-09-08 DIAGNOSIS — F458 Other somatoform disorders: Secondary | ICD-10-CM

## 2017-09-08 DIAGNOSIS — N3281 Overactive bladder: Secondary | ICD-10-CM | POA: Diagnosis not present

## 2017-09-08 DIAGNOSIS — M8000XD Age-related osteoporosis with current pathological fracture, unspecified site, subsequent encounter for fracture with routine healing: Secondary | ICD-10-CM | POA: Diagnosis not present

## 2017-09-08 DIAGNOSIS — R198 Other specified symptoms and signs involving the digestive system and abdomen: Secondary | ICD-10-CM

## 2017-09-08 DIAGNOSIS — E782 Mixed hyperlipidemia: Secondary | ICD-10-CM | POA: Diagnosis not present

## 2017-09-08 DIAGNOSIS — M25449 Effusion, unspecified hand: Secondary | ICD-10-CM | POA: Diagnosis not present

## 2017-09-08 DIAGNOSIS — F411 Generalized anxiety disorder: Secondary | ICD-10-CM

## 2017-09-08 DIAGNOSIS — R0989 Other specified symptoms and signs involving the circulatory and respiratory systems: Secondary | ICD-10-CM

## 2017-09-08 MED ORDER — PANTOPRAZOLE SODIUM 40 MG PO TBEC: 40.0000 mg | DELAYED_RELEASE_TABLET | Freq: Every day | ORAL | 1 refills | Status: DC

## 2017-09-08 NOTE — Progress Notes (Signed)
Location:  [facility] clinic Provider:  Tiffany L. Mariea Clonts, D.O., C.M.D.  Code Status:  Goals of Care:  Advanced Directives 06/24/2017  Does Patient Have a Medical Advance Directive? Yes  Type of Advance Directive Out of facility DNR (pink MOST or yellow form)  Does patient want to make changes to medical advance directive? -  Copy of [place] in Chart? -  Would patient like information on creating a medical advance directive? -  Pre-existing out of facility DNR order (yellow form or pink MOST form) Yellow form placed in chart (order not valid for inpatient use)     Chief Complaint  Patient presents with  . Medical Management of Chronic Issues    6 month F/U; patient concerned about hands and feet  . Medication Refill    No refills    HPI: Patient is a 84 y.o. female seen today for medical management of chronic diseases.    Stomach has been hurting at times, mostly in the evenings. She notices that pain before eating, but at times it is after as well. Denies changes in her diet. She has been eating foods with higher levels of salt. She reports having problems swallowing some foods. She reports it is harder to swallow certain foods such as breads. It hurts and causes a stuck feeding when she is eating. She denies trouble with swallowing medications or liquids. This has been going on several months. She stated she burped and the food came back up into her mouth and it made her a little nauseated, but that went away. She denies trouble with stomach or pain when she lays down. She has a history of H. Pylori infection and was treated and all symptoms went away after treatment. She feels that it is like when she had this infection. She does not have on going nausea. She denies bloating and gas. She reports feeling tired all the time. She reports no modifying factors for this stomach. She reports no changes in her bowel or bladder habits. She denies blood in stool or urine.    Anxiety: She continues to experience anxiety and has started having dreams about things that cause her distress, I.e. The end of the world. She feels she can manage this and does not want to take anything for this if she can avoid it.   OAB: She gets up several times a night. She does not want to take a medication because of the side effects. Still does not want to start a medication at this visit.   HLD: Last July her LDL was 114. She currently reports taking Crestor. She has gain two pounds, but this is good for her as she is thin. She denies muscle aches or changes in urine.    Past Medical History:  Diagnosis Date  . Arthritis    Bilateral hands  . Cancer ([place])    breast hx of, skin hx of  . Hyperlipidemia   . Nephrolithiasis     X1  . Osteopenia   . Positive H. pylori test 01/27/2017    Past Surgical History:  Procedure Laterality Date  . COLONOSCOPY  2004    negative; Dr Fuller Plan  . colonoscopy with polypectomy  2014  . mastestomy  2003   partial /left  . SKIN CANCER EXCISION     chest , Dr Ubaldo Glassing    Allergies  Allergen Reactions  . Oxycodone-Acetaminophen     REACTION: nausea  . Tramadol     Nausea  Outpatient Encounter Medications as of 09/08/2017  Medication Sig  . alendronate (FOSAMAX) 70 MG tablet Take 1 tablet (70 mg total) by mouth once a week. Take with a full glass of water on an empty stomach. Avoid laying down for 30 minutes.  Marland Kitchen aspirin 81 MG tablet Take 81 mg by mouth daily.    . beta carotene w/minerals (OCUVITE) tablet Take 1 tablet by mouth daily.  . Calcium Carbonate-Vitamin D (CALCIUM + D PO) Take by mouth daily.   . Cholecalciferol (VITAMIN D3) 1000 UNITS CAPS Take by mouth 2 (two) times daily.   . Coenzyme Q10 (COQ10 PO) Take 1 tablet by mouth daily.  . cyanocobalamin 500 MCG tablet Take 500 mcg by mouth daily. Take 2 pills daily  . FOLIC ACID PO Take 979 mg by mouth daily.   . Multiple Vitamin (MULTIVITAMIN) tablet Take 1 tablet by mouth  daily.    . rosuvastatin (CRESTOR) 20 MG tablet TAKE 1 TABLET BY MOUTH AT BEDTIME  . vitamin C (ASCORBIC ACID) 250 MG tablet Take 500 mg by mouth daily.   . Vitamin E 400 UNITS TABS Take by mouth daily.   No facility-administered encounter medications on file as of 09/08/2017.     Review of Systems:  Review of Systems  Constitutional: Negative for chills and fever.  Respiratory: Negative.   Cardiovascular: Negative.   Gastrointestinal: Positive for abdominal pain and nausea. Negative for blood in stool, heartburn and vomiting.  Genitourinary: Positive for frequency and urgency. Negative for hematuria.  Musculoskeletal: Negative.   Neurological: Negative for dizziness and headaches.  Psychiatric/Behavioral: Positive for depression and memory loss. The patient is nervous/anxious. The patient does not have insomnia.        Forgetfulness mainly with trying to find the right word    Health Maintenance  Topic Date Due  . MAMMOGRAM  05/30/2018  . TETANUS/TDAP  08/22/2022  . INFLUENZA VACCINE  Completed  . DEXA SCAN  Completed  . PNA vac Low Risk Adult  Completed    Physical Exam: Vitals:   09/08/17 1303  BP: 118/68  Pulse: 83  Temp: 97.8 F (36.6 C)  TempSrc: Oral  SpO2: 97%  Weight: 102 lb 3.2 oz (46.4 kg)  Height: 5\' 2"  (1.575 m)   Body mass index is 18.69 kg/m. Physical Exam  Constitutional: She is oriented to person, place, and time. She appears well-developed and well-nourished.  Cardiovascular: Normal rate, regular rhythm, normal heart sounds and intact distal pulses.  Pulmonary/Chest: Effort normal and breath sounds normal.  Abdominal: Soft. Bowel sounds are normal. She exhibits no distension and no mass. There is no tenderness. There is no rebound and no guarding. No hernia.  Musculoskeletal: Normal range of motion.  Neurological: She is alert and oriented to person, place, and time.  Skin: Skin is warm and dry. Capillary refill takes less than 2 seconds.    Psychiatric: She has a normal mood and affect. Her behavior is normal. Judgment and thought content normal.  Vitals reviewed.   Labs reviewed: Basic Metabolic Panel: Recent Labs    12/17/16 0819 01/25/17 1020  NA 142 141  K 4.3 4.5  CL 103 104  CO2 29 25  GLUCOSE 97 86  BUN 14 22  CREATININE 0.9 0.66  CALCIUM 9.6 9.2  TSH  --  1.42   Liver Function Tests: Recent Labs    12/17/16 0819 01/25/17 1020  AST 27 18  ALT 30 15  ALKPHOS 116* 82  BILITOT 0.90 0.4  PROT 7.4 6.4  ALBUMIN 3.9 4.1   Recent Labs    01/25/17 1020  LIPASE 17  AMYLASE 53   No results for input(s): AMMONIA in the last 8760 hours. CBC: Recent Labs    10/11/16 1107 12/17/16 0819 01/25/17 1020  WBC 6.1 5.5 4.4  NEUTROABS  --  4.0 3,124  HGB 13.0 13.6 13.2  HCT 38.5 41.5 40.3  MCV 93.4 95 94.4  PLT  --  191 178   Lipid Panel: Recent Labs    01/25/17 1020  CHOL 198  HDL 68  LDLCALC 114*  TRIG 79  CHOLHDL 2.9   Lab Results  Component Value Date   HGBA1C 6.2 11/29/2013    Procedures since last visit: No results found.  Assessment/Plan 1. H. pylori infection She is presenting with symptoms of possible recurrence of this infection. Will repeat breath test today and restart PPI. She could also have these symptoms related to starting Fosamax in Jan. She is unsure of when the stomach pains occurred versus the start of the medication.   -H. Pylori breath test  2. 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 She reports having on going stress that she things she can handle and does not want to take any medications for this. Concern that she might be having stress gastritis as well.   3. Osteoporosis with current pathological fracture with routine healing, unspecified osteoporosis type, subsequent encounter She was started on Fosamax in Jan secondary to having a fracture and previous history of osteopenia. She has  healed from the fx, but remains on the Fosamax but her complaints today in the office might be matched to the side effects of the this medication. Will start a PPI for now.   4. Overactive bladder She reports having to get up 4-5 times a night to pee. She continues to decline medication for this issue.   5. Mixed hyperlipidemia She is on Crestor and states taking it without issue. Will assess these at future appointments.  6. Swelling of finger joint, unspecified laterality Questionable gout verus arthritis in the second digit of the second finger. Appears to have Bouchard nodes. She has reported increase in pain as well as some mild redness at the joint. Will assess uric acid to make sure it is not gout.   - Uric Acid  7. Globus sensation This new sensation could be related to Fosamax versus H pylori reoccurrence versus some kind of reflux issue. Will consider referral to GI if she is negative for H Pylori, as concerned for why she is having trouble swallowing.    Labs/tests ordered:   Orders Placed This Encounter  Procedures  . H. pylori breath test  . Uric Acid    Next appt:    Karen Kays, RN, AGPCNP, DNP [facility] [phone], [place] [address] Cell Phone (Mon-Fri 8am-5pm):  [phone] On Call:  [phone] & follow prompts after 5pm & weekends Office Phone:  [phone] Office Fax:  [phone]

## 2017-09-09 ENCOUNTER — Telehealth: Payer: Self-pay

## 2017-09-09 ENCOUNTER — Encounter: Payer: Self-pay | Admitting: *Deleted

## 2017-09-09 LAB — URIC ACID: Uric Acid, Serum: 3 mg/dL (ref 2.5–7.0)

## 2017-09-09 LAB — H. PYLORI BREATH TEST: H. pylori Breath Test: NOT DETECTED

## 2017-09-09 NOTE — Telephone Encounter (Signed)
Patient called saying that she received a letter about her husbands losartan being recalled. I called their pharmacy to see if his medication was on the recall list and it is. I was told that patient could bring the unused losartan back to the pharmacy and it could be exchanged for a non-recalled batch of losartan.   Whitney Donaldson verbalized understanding and stated that she was going to the pharmacy now.

## 2017-09-29 DIAGNOSIS — H353211 Exudative age-related macular degeneration, right eye, with active choroidal neovascularization: Secondary | ICD-10-CM | POA: Diagnosis not present

## 2017-09-29 DIAGNOSIS — H35371 Puckering of macula, right eye: Secondary | ICD-10-CM | POA: Diagnosis not present

## 2017-09-29 DIAGNOSIS — H35341 Macular cyst, hole, or pseudohole, right eye: Secondary | ICD-10-CM | POA: Diagnosis not present

## 2017-09-29 DIAGNOSIS — H353122 Nonexudative age-related macular degeneration, left eye, intermediate dry stage: Secondary | ICD-10-CM | POA: Diagnosis not present

## 2017-10-06 ENCOUNTER — Encounter: Payer: Self-pay | Admitting: Nurse Practitioner

## 2017-10-06 ENCOUNTER — Ambulatory Visit (INDEPENDENT_AMBULATORY_CARE_PROVIDER_SITE_OTHER): Payer: Medicare HMO | Admitting: Nurse Practitioner

## 2017-10-06 VITALS — BP 128/70 | HR 85 | Temp 97.8°F | Ht 62.0 in | Wt 103.6 lb

## 2017-10-06 DIAGNOSIS — M8000XD Age-related osteoporosis with current pathological fracture, unspecified site, subsequent encounter for fracture with routine healing: Secondary | ICD-10-CM | POA: Diagnosis not present

## 2017-10-06 DIAGNOSIS — K2970 Gastritis, unspecified, without bleeding: Secondary | ICD-10-CM

## 2017-10-06 NOTE — Patient Instructions (Signed)
STOP fosamax Continue protonix for another month then stop, notify if symptoms worsen

## 2017-10-06 NOTE — Progress Notes (Signed)
Careteam: Patient Care Team: Lauree Chandler, NP as PCP - General (Geriatric Medicine) Renette Butters, MD as Consulting Physician (Orthopedic Surgery) Rolm Bookbinder, MD as Consulting Physician (Dermatology) Kathie Rhodes, MD as Consulting Physician (Urology) Marin Olp Rudell Cobb, MD as Consulting Physician (Oncology)  Advanced Directive information    Allergies  Allergen Reactions  . Oxycodone-Acetaminophen     REACTION: nausea  . Tramadol     Nausea     Chief Complaint  Patient presents with  . Follow-up    Pt stated to discuss last appt: Pt ask to discuss Alendronate  . Medication Refill    No Refills     HPI: Patient is a 84 y.o. female seen in the office today to follow up GI symptoms.  At last visit 1 month ago noted stomach had been hurting in the evenings. Pt with hx of h. pylori and was treated, followed up lab on this which was negative.  Also with symptoms of odynophagia with globus sensation which has improved as well.  Has been taking protonix since last visit.  Weight was been stable  Review of Systems:  Review of Systems  Constitutional: Negative for chills, fever, malaise/fatigue and weight loss.  Respiratory: Negative for cough and shortness of breath.   Cardiovascular: Negative for chest pain and palpitations.  Gastrointestinal: Negative for abdominal pain, constipation, diarrhea, heartburn and nausea.  Genitourinary: Negative for dysuria.  Musculoskeletal: Negative for back pain, falls (none recently) and joint pain.  Skin: Negative.   Neurological: Negative for dizziness, tingling, weakness and headaches.  Psychiatric/Behavioral: The patient does not have insomnia.    Past Medical History:  Diagnosis Date  . Arthritis    Bilateral hands  . Cancer ([place])    breast hx of, skin hx of  . Hyperlipidemia   . Nephrolithiasis     X1  . Osteopenia   . Positive H. pylori test 01/27/2017   Past Surgical History:  Procedure Laterality Date  .  COLONOSCOPY  2004    negative; Dr Fuller Plan  . colonoscopy with polypectomy  2014  . mastestomy  2003   partial /left  . SKIN CANCER EXCISION     chest , Dr Ubaldo Glassing   Social History:   reports that she has never smoked. She has never used smokeless tobacco. She reports that she does not drink alcohol or use drugs.  Family History  Problem Relation Age of Onset  . Heart attack Mother 71  . Heart disease Mother   . Lung cancer Father        smoker  . COPD Father   . Cancer Father   . Stroke Sister 86  . Alzheimer's disease Sister   . Heart attack Brother 36  . Heart disease Brother   . Diabetes Neg Hx     Medications: Patient's Medications  New Prescriptions   No medications on file  Previous Medications   ALENDRONATE (FOSAMAX) 70 MG TABLET    Take 1 tablet (70 mg total) by mouth once a week. Take with a full glass of water on an empty stomach. Avoid laying down for 30 minutes.   ASPIRIN 81 MG TABLET    Take 81 mg by mouth daily.     BETA CAROTENE W/MINERALS (OCUVITE) TABLET    Take 1 tablet by mouth daily.   CALCIUM CARBONATE-VITAMIN D (CALCIUM + D PO)    Take by mouth daily.    CHOLECALCIFEROL (VITAMIN D3) 1000 UNITS CAPS    Take by  mouth 2 (two) times daily.    COENZYME Q10 (COQ10 PO)    Take 1 tablet by mouth daily.   CYANOCOBALAMIN 500 MCG TABLET    Take 500 mcg by mouth daily. Take 2 pills daily   FOLIC ACID PO    Take 355 mg by mouth daily.    MULTIPLE VITAMIN (MULTIVITAMIN) TABLET    Take 1 tablet by mouth daily.     PANTOPRAZOLE (PROTONIX) 40 MG TABLET    Take 1 tablet (40 mg total) by mouth daily.   ROSUVASTATIN (CRESTOR) 20 MG TABLET    TAKE 1 TABLET BY MOUTH AT BEDTIME   VITAMIN C (ASCORBIC ACID) 250 MG TABLET    Take 500 mg by mouth daily.    VITAMIN E 400 UNITS TABS    Take by mouth daily.  Modified Medications   No medications on file  Discontinued Medications   No medications on file     Physical Exam:  Vitals:   10/06/17 1252  BP: 128/70  Pulse: 85    Temp: 97.8 F (36.6 C)  TempSrc: Oral  SpO2: 96%  Weight: 103 lb 9.6 oz (47 kg)  Height: 5\' 2"  (1.575 m)   Body mass index is 18.95 kg/m.  Physical Exam  Constitutional: She is oriented to person, place, and time. She appears well-developed and well-nourished.  Cardiovascular: Normal rate, regular rhythm and normal heart sounds.  Pulmonary/Chest: Effort normal and breath sounds normal.  Abdominal: Soft. Bowel sounds are normal. She exhibits no distension. There is no tenderness.  Musculoskeletal: Normal range of motion.  Neurological: She is alert and oriented to person, place, and time.  Skin: Skin is warm and dry.  Psychiatric: She has a normal mood and affect. Her behavior is normal. Judgment and thought content normal.    Labs reviewed: Basic Metabolic Panel: Recent Labs    12/17/16 0819 01/25/17 1020  NA 142 141  K 4.3 4.5  CL 103 104  CO2 29 25  GLUCOSE 97 86  BUN 14 22  CREATININE 0.9 0.66  CALCIUM 9.6 9.2  TSH  --  1.42   Liver Function Tests: Recent Labs    12/17/16 0819 01/25/17 1020  AST 27 18  ALT 30 15  ALKPHOS 116* 82  BILITOT 0.90 0.4  PROT 7.4 6.4  ALBUMIN 3.9 4.1   Recent Labs    01/25/17 1020  LIPASE 17  AMYLASE 53   No results for input(s): AMMONIA in the last 8760 hours. CBC: Recent Labs    10/11/16 1107 12/17/16 0819 01/25/17 1020  WBC 6.1 5.5 4.4  NEUTROABS  --  4.0 3,124  HGB 13.0 13.6 13.2  HCT 38.5 41.5 40.3  MCV 93.4 95 94.4  PLT  --  191 178   Lipid Panel: Recent Labs    01/25/17 1020  CHOL 198  HDL 68  LDLCALC 114*  TRIG 79  CHOLHDL 2.9   TSH: Recent Labs    01/25/17 1020  TSH 1.42   A1C: Lab Results  Component Value Date   HGBA1C 6.2 11/29/2013     Assessment/Plan 1. Gastritis without bleeding, unspecified chronicity, unspecified gastritis type Improved after stopping fosamax, to continue protonix for 1 more month then stop, to notify if symptoms recur   2. Osteoporosis with current  pathological fracture with routine healing, unspecified osteoporosis type, subsequent encounter -unable to tolerate fosamax due to gastritis, could not afford prolia, at this time. Will continue calcium with Vit D and weight bearing activity  Next appt: 3 months, sooner if needed  Hema Lanza K. [place], [facility] [phone]

## 2017-10-25 ENCOUNTER — Ambulatory Visit: Payer: Medicare HMO | Admitting: Nurse Practitioner

## 2017-11-03 DIAGNOSIS — H353122 Nonexudative age-related macular degeneration, left eye, intermediate dry stage: Secondary | ICD-10-CM | POA: Diagnosis not present

## 2017-11-03 DIAGNOSIS — H353211 Exudative age-related macular degeneration, right eye, with active choroidal neovascularization: Secondary | ICD-10-CM | POA: Diagnosis not present

## 2017-11-03 DIAGNOSIS — H35341 Macular cyst, hole, or pseudohole, right eye: Secondary | ICD-10-CM | POA: Diagnosis not present

## 2017-11-03 DIAGNOSIS — H35371 Puckering of macula, right eye: Secondary | ICD-10-CM | POA: Diagnosis not present

## 2017-11-07 ENCOUNTER — Other Ambulatory Visit: Payer: Self-pay | Admitting: Internal Medicine

## 2017-11-07 DIAGNOSIS — R0989 Other specified symptoms and signs involving the circulatory and respiratory systems: Secondary | ICD-10-CM

## 2017-11-07 DIAGNOSIS — A048 Other specified bacterial intestinal infections: Secondary | ICD-10-CM

## 2017-11-07 DIAGNOSIS — R198 Other specified symptoms and signs involving the digestive system and abdomen: Secondary | ICD-10-CM

## 2017-12-15 DIAGNOSIS — H35371 Puckering of macula, right eye: Secondary | ICD-10-CM | POA: Diagnosis not present

## 2017-12-15 DIAGNOSIS — H353122 Nonexudative age-related macular degeneration, left eye, intermediate dry stage: Secondary | ICD-10-CM | POA: Diagnosis not present

## 2017-12-15 DIAGNOSIS — H35341 Macular cyst, hole, or pseudohole, right eye: Secondary | ICD-10-CM | POA: Diagnosis not present

## 2017-12-15 DIAGNOSIS — H353211 Exudative age-related macular degeneration, right eye, with active choroidal neovascularization: Secondary | ICD-10-CM | POA: Diagnosis not present

## 2017-12-16 ENCOUNTER — Other Ambulatory Visit: Payer: Self-pay

## 2017-12-16 MED ORDER — ZOSTER VAC RECOMB ADJUVANTED 50 MCG/0.5ML IM SUSR: 0.5000 mL | Freq: Once | INTRAMUSCULAR | 1 refills | Status: AC

## 2017-12-20 DIAGNOSIS — R69 Illness, unspecified: Secondary | ICD-10-CM | POA: Diagnosis not present

## 2017-12-21 ENCOUNTER — Other Ambulatory Visit: Payer: Self-pay | Admitting: *Deleted

## 2017-12-21 DIAGNOSIS — Z0000 Encounter for general adult medical examination without abnormal findings: Secondary | ICD-10-CM

## 2017-12-22 ENCOUNTER — Other Ambulatory Visit: Payer: Self-pay

## 2017-12-22 ENCOUNTER — Inpatient Hospital Stay (HOSPITAL_BASED_OUTPATIENT_CLINIC_OR_DEPARTMENT_OTHER): Payer: Medicare HMO | Admitting: Hematology & Oncology

## 2017-12-22 ENCOUNTER — Encounter: Payer: Self-pay | Admitting: Hematology & Oncology

## 2017-12-22 ENCOUNTER — Inpatient Hospital Stay: Payer: Medicare HMO | Attending: Hematology & Oncology

## 2017-12-22 VITALS — BP 125/57 | HR 82 | Temp 97.8°F | Resp 18 | Wt 94.0 lb

## 2017-12-22 DIAGNOSIS — Z853 Personal history of malignant neoplasm of breast: Secondary | ICD-10-CM | POA: Insufficient documentation

## 2017-12-22 DIAGNOSIS — C50011 Malignant neoplasm of nipple and areola, right female breast: Secondary | ICD-10-CM

## 2017-12-22 DIAGNOSIS — Z79899 Other long term (current) drug therapy: Secondary | ICD-10-CM | POA: Insufficient documentation

## 2017-12-22 DIAGNOSIS — Z0000 Encounter for general adult medical examination without abnormal findings: Secondary | ICD-10-CM

## 2017-12-22 LAB — CMP (CANCER CENTER ONLY)
ALBUMIN: 4.3 g/dL (ref 3.5–5.0)
ALT: 22 U/L (ref 0–44)
ANION GAP: 5 (ref 5–15)
AST: 22 U/L (ref 15–41)
Alkaline Phosphatase: 58 U/L (ref 38–126)
BUN: 25 mg/dL — ABNORMAL HIGH (ref 8–23)
CHLORIDE: 104 mmol/L (ref 98–111)
CO2: 32 mmol/L (ref 22–32)
Calcium: 9.5 mg/dL (ref 8.9–10.3)
Creatinine: 0.81 mg/dL (ref 0.44–1.00)
GFR, Estimated: >60 mL/min (ref >60)
GLUCOSE: 99 mg/dL (ref 70–99)
POTASSIUM: 4.5 mmol/L (ref 3.5–5.1)
SODIUM: 141 mmol/L (ref 135–145)
Total Bilirubin: 0.5 mg/dL (ref 0.3–1.2)
Total Protein: 6.9 g/dL (ref 6.5–8.1)

## 2017-12-22 LAB — CBC WITH DIFFERENTIAL (CANCER CENTER ONLY)
BASOS ABS: 0 10*3/uL (ref 0.0–0.1)
Basophils Relative: 1 %
Eosinophils Absolute: 0 10*3/uL (ref 0.0–0.5)
Eosinophils Relative: 0 %
HCT: 42.1 % (ref 34.8–46.6)
Hemoglobin: 13.8 g/dL (ref 11.6–15.9)
LYMPHS ABS: 0.9 10*3/uL (ref 0.9–3.3)
Lymphocytes Relative: 22 %
MCH: 31.7 pg (ref 26.0–34.0)
MCHC: 32.8 g/dL (ref 32.0–36.0)
MCV: 96.8 fL (ref 81.0–101.0)
MONO ABS: 0.6 10*3/uL (ref 0.1–0.9)
Monocytes Relative: 14 %
NEUTROS ABS: 2.7 10*3/uL (ref 1.5–6.5)
Neutrophils Relative %: 63 %
PLATELETS: 147 10*3/uL (ref 145–400)
RBC: 4.35 MIL/uL (ref 3.70–5.32)
RDW: 11.8 % (ref 11.1–15.7)
WBC Count: 4.2 10*3/uL (ref 3.9–10.0)

## 2017-12-22 NOTE — Progress Notes (Signed)
Hematology and Oncology Follow Up Visit  Whitney Donaldson [id number] [birth year] 84 y.o. 12/22/2017   Principle Diagnosis:  Stage I (T1 N0 M0) ductal carcinoma of the left breast.  Current Therapy:    Observation     Interim History:  [name] is back for followup. We see her yearly.   Everything is going better for her.  The only issue with her now is that her husband is developing dementia.  There is been no problems with herself.  She did fracture her pelvis a year ago.  She has gone through this.  She is had no cough.  There is been no nausea or vomiting.  She is had no change in bowel or bladder habits.  There is no rashes.  She is had no change in medications.  Overall, her performance status is ECOG 1.   Medications:  Current Outpatient Medications:  .  aspirin 81 MG tablet, Take 81 mg by mouth daily.  , Disp: , Rfl:  .  beta carotene w/minerals (OCUVITE) tablet, Take 1 tablet by mouth daily., Disp: , Rfl:  .  Calcium Carbonate-Vitamin D (CALCIUM + D PO), Take by mouth daily. , Disp: , Rfl:  .  Cholecalciferol (VITAMIN D3) 1000 UNITS CAPS, Take by mouth 2 (two) times daily. , Disp: , Rfl:  .  Coenzyme Q10 (COQ10 PO), Take 1 tablet by mouth daily., Disp: , Rfl:  .  cyanocobalamin 500 MCG tablet, Take 500 mcg by mouth daily. Take 2 pills daily, Disp: , Rfl:  .  FOLIC ACID PO, Take 967 mg by mouth daily. , Disp: , Rfl:  .  Multiple Vitamin (MULTIVITAMIN) tablet, Take 1 tablet by mouth daily.  , Disp: , Rfl:  .  pantoprazole (PROTONIX) 40 MG tablet, TAKE 1 TABLET BY MOUTH ONCE DAILY, Disp: 30 tablet, Rfl: 1 .  rosuvastatin (CRESTOR) 20 MG tablet, TAKE 1 TABLET BY MOUTH AT BEDTIME, Disp: 90 tablet, Rfl: 1 .  vitamin C (ASCORBIC ACID) 250 MG tablet, Take 500 mg by mouth daily. , Disp: , Rfl:  .  Vitamin E 400 UNITS TABS, Take by mouth daily., Disp: , Rfl:   Allergies:  Allergies  Allergen Reactions  . Fosamax [Alendronate Sodium]     Gastritis  .  Oxycodone-Acetaminophen     REACTION: nausea  . Tramadol     Nausea     Past Medical History, Surgical history, Social history, and Family History were reviewed and updated.  Review of Systems: Review of Systems  Constitutional: Negative.   HENT: Negative.   Eyes: Negative.   Respiratory: Negative.   Cardiovascular: Negative.   Gastrointestinal: Negative.   Genitourinary: Negative.   Musculoskeletal: Negative.   Skin: Negative.   Neurological: Negative.   Endo/Heme/Allergies: Negative.   Psychiatric/Behavioral: Negative.      Physical Exam:  vitals were not taken for this visit.   Petite white female. Her head and neck exam shows no ocular or oral lesions. There are no palpable cervical or supraclavicular lymph nodes. Lungs are clear bilateral. Cardiac exam regular rate and rhythm with no murmurs rubs or bruits. Abdomen is soft. She has good bowel sounds. There is no fluid wave. There is no palpable liver or spleen tip. This exam right breast with no masses or edema or erythema. There is no right axillary adenopathy. Left breast shows well-healed lumpectomy. The lumpectomy is at the 2:00 position. No masses noted in the left breast. There is no left axillary adenopathy. Neck exam shows  no kyphosis. There is no tenderness over the spine ribs or hips. Extremities shows no clubbing cyanosis or edema. Neurological exam shows no focal neurological deficits. Skin exam no rashes.  Lab Results  Component Value Date   WBC 4.2 12/22/2017   HGB 13.8 12/22/2017   HCT 42.1 12/22/2017   MCV 96.8 12/22/2017   PLT 147 12/22/2017     Chemistry      Component Value Date/Time   NA 141 01/25/2017 1020   NA 142 12/17/2016 0819   NA 142 12/18/2015 0838   K 4.5 01/25/2017 1020   K 4.3 12/17/2016 0819   K 4.0 12/18/2015 0838   CL 104 01/25/2017 1020   CL 103 12/17/2016 0819   CO2 25 01/25/2017 1020   CO2 29 12/17/2016 0819   CO2 29 12/18/2015 0838   BUN 22 01/25/2017 1020   BUN 14  12/17/2016 0819   BUN 20.9 12/18/2015 0838   CREATININE 0.66 01/25/2017 1020   CREATININE 0.8 12/18/2015 0838      Component Value Date/Time   CALCIUM 9.2 01/25/2017 1020   CALCIUM 9.6 12/17/2016 0819   CALCIUM 9.3 12/18/2015 0838   ALKPHOS 82 01/25/2017 1020   ALKPHOS 116 (H) 12/17/2016 0819   ALKPHOS 56 12/18/2015 0838   AST 18 01/25/2017 1020   AST 27 12/17/2016 0819   AST 18 12/18/2015 0838   ALT 15 01/25/2017 1020   ALT 30 12/17/2016 0819   ALT 22 12/18/2015 0838   BILITOT 0.4 01/25/2017 1020   BILITOT 0.90 12/17/2016 0819   BILITOT 0.60 12/18/2015 0838       Impression and Plan: Whitney Donaldson is a 84 year old white female with end-stage 1 ductal carcinoma the left breast. She's doing well. She is 17  years out now. I see no evidence of recurrent disease.  Right now, everything looks great.  She is 17 years out from her surgery.  I really do not think that there is been to be a problem with her having recurrent disease.  We will plan to go back in one more year.   Volanda Napoleon, MD 6/27/201910:00 AM

## 2017-12-23 ENCOUNTER — Encounter: Payer: Self-pay | Admitting: Nurse Practitioner

## 2017-12-23 ENCOUNTER — Ambulatory Visit (INDEPENDENT_AMBULATORY_CARE_PROVIDER_SITE_OTHER): Payer: Medicare HMO | Admitting: Nurse Practitioner

## 2017-12-23 VITALS — BP 122/72 | HR 82 | Temp 98.2°F | Ht 62.0 in | Wt 102.0 lb

## 2017-12-23 DIAGNOSIS — M8000XD Age-related osteoporosis with current pathological fracture, unspecified site, subsequent encounter for fracture with routine healing: Secondary | ICD-10-CM

## 2017-12-23 DIAGNOSIS — E782 Mixed hyperlipidemia: Secondary | ICD-10-CM

## 2017-12-23 DIAGNOSIS — K2970 Gastritis, unspecified, without bleeding: Secondary | ICD-10-CM

## 2017-12-23 DIAGNOSIS — N3281 Overactive bladder: Secondary | ICD-10-CM

## 2017-12-23 LAB — LIPID PANEL
Cholesterol: 204 mg/dL — ABNORMAL HIGH (ref <200)
HDL: 72 mg/dL (ref >50)
LDL Cholesterol (Calc): 117 mg/dL (calc) — ABNORMAL HIGH
Non-HDL Cholesterol (Calc): 132 mg/dL (calc) — ABNORMAL HIGH (ref <130)
TRIGLYCERIDES: 63 mg/dL (ref <150)
Total CHOL/HDL Ratio: 2.8 (calc) (ref <5.0)

## 2017-12-23 MED ORDER — MIRABEGRON ER 25 MG PO TB24: 25.0000 mg | ORAL_TABLET | Freq: Every day | ORAL | 3 refills | Status: DC

## 2017-12-23 NOTE — Progress Notes (Signed)
Careteam: Patient Care Team: Lauree Chandler, NP as PCP - General (Geriatric Medicine) Renette Butters, MD as Consulting Physician (Orthopedic Surgery) Rolm Bookbinder, MD as Consulting Physician (Dermatology) Kathie Rhodes, MD as Consulting Physician (Urology) Marin Olp Rudell Cobb, MD as Consulting Physician (Oncology)  Advanced Directive information Does Patient Have a Medical Advance Directive?: Yes, Type of Advance Directive: [place];Out of facility DNR (pink MOST or yellow form), Pre-existing out of facility DNR order (yellow form or pink MOST form): Yellow form placed in chart (order not valid for inpatient use)  Allergies  Allergen Reactions  . Fosamax [Alendronate Sodium]     Gastritis  . Oxycodone-Acetaminophen     REACTION: nausea  . Tramadol     Nausea     Chief Complaint  Patient presents with  . Medical Management of Chronic Issues    Pt is being seen for a 3 month routine visit. Pt would like to discuss medication for night time urination.      HPI: Patient is a 84 y.o. female seen in the office today for routine follow up.   Went to see Dr Marin Olp to follow up stage 1 ductal carinoma of the left breast 17 years ago. No signs of recurrence.   GERD- controlled on protonix and diet.   OSTEOPENIA- last year fractured pelvis, could not tolerate fosamax due to increase in GERD. Continues on cal and vit D.   Hyperlipidemia- taking crestor, last LDL 1 year ago at 114.   Overactive bladder- getting up 3-5 times a night for years. Interested in starting medication for this.  Review of Systems:  Review of Systems  Constitutional: Negative for chills, fever, malaise/fatigue and weight loss.  HENT: Negative.   Respiratory: Negative for cough and shortness of breath.   Cardiovascular: Negative for chest pain and palpitations.  Gastrointestinal: Negative for abdominal pain, constipation, diarrhea, heartburn and nausea.  Genitourinary: Positive  for frequency (at night). Negative for dysuria.  Musculoskeletal: Negative for back pain, falls and joint pain.  Skin: Negative.   Neurological: Negative for dizziness, tingling, weakness and headaches.  Psychiatric/Behavioral: The patient does not have insomnia.     Past Medical History:  Diagnosis Date  . Arthritis    Bilateral hands  . Cancer ([place])    breast hx of, skin hx of  . Hyperlipidemia   . Nephrolithiasis     X1  . Osteopenia   . Positive H. pylori test 01/27/2017   Past Surgical History:  Procedure Laterality Date  . COLONOSCOPY  2004    negative; Dr Fuller Plan  . colonoscopy with polypectomy  2014  . mastestomy  2003   partial /left  . SKIN CANCER EXCISION     chest , Dr Ubaldo Glassing   Social History:   reports that she has never smoked. She has never used smokeless tobacco. She reports that she does not drink alcohol or use drugs.  Family History  Problem Relation Age of Onset  . Heart attack Mother 54  . Heart disease Mother   . Lung cancer Father        smoker  . COPD Father   . Cancer Father   . Stroke Sister 56  . Alzheimer's disease Sister   . Heart attack Brother 72  . Heart disease Brother   . Diabetes Neg Hx     Medications: Patient's Medications  New Prescriptions   No medications on file  Previous Medications   ASPIRIN 81 MG TABLET  Take 81 mg by mouth daily.     CALCIUM CARBONATE-VITAMIN D (CALCIUM + D PO)    Take by mouth daily.    CHOLECALCIFEROL (VITAMIN D3) 1000 UNITS CAPS    Take by mouth 2 (two) times daily.    COENZYME Q10 (COQ10 PO)    Take 1 tablet by mouth daily.   CYANOCOBALAMIN 500 MCG TABLET    Take 500 mcg by mouth daily. Take 2 pills daily   FOLIC ACID PO    Take 825 mg by mouth daily.    MULTIPLE VITAMIN (MULTIVITAMIN) TABLET    Take 1 tablet by mouth daily.     MULTIPLE VITAMINS-MINERALS (VISION-VITE PRESERVE PO)    Take by mouth.   PANTOPRAZOLE (PROTONIX) 40 MG TABLET    TAKE 1 TABLET BY MOUTH ONCE DAILY   ROSUVASTATIN  (CRESTOR) 20 MG TABLET    TAKE 1 TABLET BY MOUTH AT BEDTIME   VITAMIN C (ASCORBIC ACID) 250 MG TABLET    Take 500 mg by mouth daily.    VITAMIN E 400 UNITS TABS    Take by mouth daily.  Modified Medications   No medications on file  Discontinued Medications   No medications on file     Physical Exam:  Vitals:   12/23/17 1014  BP: 122/72  Pulse: 82  Temp: 98.2 F (36.8 C)  TempSrc: Oral  SpO2: 96%  Weight: 102 lb (46.3 kg)  Height: 5\' 2"  (1.575 m)   Body mass index is 18.66 kg/m.  Physical Exam  Constitutional: She is oriented to person, place, and time. She appears well-developed and well-nourished.  HENT:  Head: Normocephalic and atraumatic.  Nose: Nose normal.  Mouth/Throat: No oropharyngeal exudate.  Eyes: Pupils are equal, round, and reactive to light. EOM are normal.  Neck: Normal range of motion. Neck supple.  Cardiovascular: Normal rate, regular rhythm and normal heart sounds.  Pulmonary/Chest: Effort normal and breath sounds normal.  Abdominal: Soft. Bowel sounds are normal. She exhibits no distension. There is no tenderness.  Musculoskeletal: Normal range of motion.  Neurological: She is alert and oriented to person, place, and time.  Skin: Skin is warm and dry.  Psychiatric: She has a normal mood and affect. Her behavior is normal. Judgment and thought content normal.    Labs reviewed: Basic Metabolic Panel: Recent Labs    01/25/17 1020 12/22/17 0933  NA 141 141  K 4.5 4.5  CL 104 104  CO2 25 32  GLUCOSE 86 99  BUN 22 25*  CREATININE 0.66 0.81  CALCIUM 9.2 9.5  TSH 1.42  --    Liver Function Tests: Recent Labs    01/25/17 1020 12/22/17 0933  AST 18 22  ALT 15 22  ALKPHOS 82 58  BILITOT 0.4 0.5  PROT 6.4 6.9  ALBUMIN 4.1 4.3   Recent Labs    01/25/17 1020  LIPASE 17  AMYLASE 53   No results for input(s): AMMONIA in the last 8760 hours. CBC: Recent Labs    01/25/17 1020 12/22/17 0933  WBC 4.4 4.2  NEUTROABS 3,124 2.7  HGB  13.2 13.8  HCT 40.3 42.1  MCV 94.4 96.8  PLT 178 147   Lipid Panel: Recent Labs    01/25/17 1020  CHOL 198  HDL 68  LDLCALC 114*  TRIG 79  CHOLHDL 2.9   TSH: Recent Labs    01/25/17 1020  TSH 1.42   A1C: Lab Results  Component Value Date   HGBA1C 6.2 11/29/2013  Assessment/Plan 1. Mixed hyperlipidemia -continues crestor - Lipid Panel  2. OAB (overactive bladder) - mirabegron ER (MYRBETRIQ) 25 MG TB24 tablet; Take 1 tablet (25 mg total) by mouth daily.  Dispense: 30 tablet; Refill: 3  3. Osteoporosis with current pathological fracture with routine healing, unspecified osteoporosis type, subsequent encounter -prolia was not covered by insurance and she could not tolerate fosamax due to worsening gastritis.to continue cal with vit d and weight bearing activity.  4. Gastritis without bleeding, unspecified chronicity, unspecified gastritis type Improved.   Next appt: 03/16/2018 Carlos American. [place], [facility] [phone]

## 2017-12-27 ENCOUNTER — Telehealth: Payer: Self-pay

## 2017-12-27 NOTE — Telephone Encounter (Signed)
Patient called to say that the prescription for the myrbetriq 25 mg is going to cost her over $200 for a 30 day supply and she cannot afford this. She would like to know if you want her to continue taking the samples or stop them. She stated that she has about 4 weeks worth of samples.   Please advise

## 2017-12-28 NOTE — Telephone Encounter (Signed)
Can stop them if she does not wish to continue. Can try vesicare 5 mg by mouth daily but I imagine if they are not covering the myrbetriq this will be high too.

## 2017-12-28 NOTE — Telephone Encounter (Signed)
I spoke with patient and she stated that she would like to hold off on trying any new medication at this time. She will call back if she changes her mind.

## 2017-12-28 NOTE — Telephone Encounter (Signed)
I left a message for patient to call the office 

## 2018-01-05 ENCOUNTER — Other Ambulatory Visit: Payer: Self-pay | Admitting: Nurse Practitioner

## 2018-01-05 DIAGNOSIS — A048 Other specified bacterial intestinal infections: Secondary | ICD-10-CM

## 2018-01-05 DIAGNOSIS — R198 Other specified symptoms and signs involving the digestive system and abdomen: Secondary | ICD-10-CM

## 2018-01-05 DIAGNOSIS — R0989 Other specified symptoms and signs involving the circulatory and respiratory systems: Secondary | ICD-10-CM

## 2018-01-18 DIAGNOSIS — D1801 Hemangioma of skin and subcutaneous tissue: Secondary | ICD-10-CM | POA: Diagnosis not present

## 2018-01-18 DIAGNOSIS — D2271 Melanocytic nevi of right lower limb, including hip: Secondary | ICD-10-CM | POA: Diagnosis not present

## 2018-01-18 DIAGNOSIS — D225 Melanocytic nevi of trunk: Secondary | ICD-10-CM | POA: Diagnosis not present

## 2018-01-18 DIAGNOSIS — L814 Other melanin hyperpigmentation: Secondary | ICD-10-CM | POA: Diagnosis not present

## 2018-01-18 DIAGNOSIS — D2262 Melanocytic nevi of left upper limb, including shoulder: Secondary | ICD-10-CM | POA: Diagnosis not present

## 2018-01-18 DIAGNOSIS — L821 Other seborrheic keratosis: Secondary | ICD-10-CM | POA: Diagnosis not present

## 2018-01-26 ENCOUNTER — Other Ambulatory Visit: Payer: Self-pay | Admitting: Nurse Practitioner

## 2018-01-26 DIAGNOSIS — E782 Mixed hyperlipidemia: Secondary | ICD-10-CM

## 2018-02-08 ENCOUNTER — Telehealth: Payer: Self-pay

## 2018-02-08 NOTE — Telephone Encounter (Signed)
I spoke with patient and she verbalized understanding. She stated that she would call back if the diarrhea got worse or if she developed any other symptoms.

## 2018-02-08 NOTE — Telephone Encounter (Signed)
Patient called c/o abnormal bowels  1. Is this the first time you had this concern? yes  2. Any abdominal pain? Some stomach cramping  3. Are you experiencing any dizziness or weakness? Some weakness  4. If blood is present, how much? How often? How long since you first noticed? No blood  5. Have you eaten anything red? No   6. Have you taken any Peptio Bismol? no  I will forward this information to your provider and call with instructions, if your symptoms persist or progress, seek medical attention at you nearest urgent care or emergency room. Patient verbalized understanding.    Patient stated that she has had several episodes of diarrhea in the mornings when she first wakes up for last 2 days. She stated that she had some nausea on Monday but none since. She stated that symptoms started after eating too much spicy food over the weekend.   Please advise on any recommendations.

## 2018-02-08 NOTE — Telephone Encounter (Signed)
If she has not had fever, chills or blood, I would recommend she gradually begin taking liquids, then advance to a BRAT diet if tolerated.

## 2018-02-09 DIAGNOSIS — H35033 Hypertensive retinopathy, bilateral: Secondary | ICD-10-CM | POA: Diagnosis not present

## 2018-02-09 DIAGNOSIS — H35371 Puckering of macula, right eye: Secondary | ICD-10-CM | POA: Diagnosis not present

## 2018-02-09 DIAGNOSIS — H353211 Exudative age-related macular degeneration, right eye, with active choroidal neovascularization: Secondary | ICD-10-CM | POA: Diagnosis not present

## 2018-02-09 DIAGNOSIS — H353122 Nonexudative age-related macular degeneration, left eye, intermediate dry stage: Secondary | ICD-10-CM | POA: Diagnosis not present

## 2018-03-10 ENCOUNTER — Other Ambulatory Visit: Payer: Self-pay | Admitting: Nurse Practitioner

## 2018-03-10 DIAGNOSIS — A048 Other specified bacterial intestinal infections: Secondary | ICD-10-CM

## 2018-03-10 DIAGNOSIS — R0989 Other specified symptoms and signs involving the circulatory and respiratory systems: Secondary | ICD-10-CM

## 2018-03-10 DIAGNOSIS — R198 Other specified symptoms and signs involving the digestive system and abdomen: Secondary | ICD-10-CM

## 2018-03-13 ENCOUNTER — Telehealth: Payer: Self-pay | Admitting: Nurse Practitioner

## 2018-03-13 NOTE — Telephone Encounter (Signed)
I called the patient to ask if she wants to come at 11:30 instead of 2:30 on 03/16/18 for AWV w/ Clarise Cruz.  She will check her calendar when she gets home and call me back. VDM (DD)

## 2018-03-14 NOTE — Telephone Encounter (Signed)
Rescheduled to 03/20/18

## 2018-03-16 ENCOUNTER — Ambulatory Visit: Payer: Medicare HMO

## 2018-03-16 DIAGNOSIS — H353122 Nonexudative age-related macular degeneration, left eye, intermediate dry stage: Secondary | ICD-10-CM | POA: Diagnosis not present

## 2018-03-16 DIAGNOSIS — H35341 Macular cyst, hole, or pseudohole, right eye: Secondary | ICD-10-CM | POA: Diagnosis not present

## 2018-03-16 DIAGNOSIS — H35371 Puckering of macula, right eye: Secondary | ICD-10-CM | POA: Diagnosis not present

## 2018-03-16 DIAGNOSIS — H353211 Exudative age-related macular degeneration, right eye, with active choroidal neovascularization: Secondary | ICD-10-CM | POA: Diagnosis not present

## 2018-03-20 ENCOUNTER — Ambulatory Visit (INDEPENDENT_AMBULATORY_CARE_PROVIDER_SITE_OTHER): Payer: Medicare HMO

## 2018-03-20 VITALS — BP 118/56 | HR 90 | Temp 97.7°F | Ht 62.0 in | Wt 101.0 lb

## 2018-03-20 DIAGNOSIS — Z0000 Encounter for general adult medical examination without abnormal findings: Secondary | ICD-10-CM

## 2018-03-20 DIAGNOSIS — Z23 Encounter for immunization: Secondary | ICD-10-CM

## 2018-03-20 NOTE — Patient Instructions (Signed)
[name] , Thank you for taking time to come for your Medicare Wellness Visit. I appreciate your ongoing commitment to your health goals. Please review the following plan we discussed and let me know if I can assist you in the future.   Screening recommendations/referrals: Colonoscopy excluded, over age 82 Mammogram excluded, over age 29 Bone Density up to date Recommended yearly ophthalmology/optometry visit for glaucoma screening and checkup Recommended yearly dental visit for hygiene and checkup  Vaccinations: Influenza vaccine high dose given today Pneumococcal vaccine up to date, completed Tdap vaccine up to date, due 08/22/2022 Shingles vaccine please get second shot     Advanced directives: in chart  Conditions/risks identified: none  Next appointment: [name], NP 06/26/2018 @ 10:15am             [name], RN 03/23/2019 @ 12:45pm   Preventive Care 65 Years and Older, Female Preventive care refers to lifestyle choices and visits with your health care provider that can promote health and wellness. What does preventive care include?  A yearly physical exam. This is also called an annual well check.  Dental exams once or twice a year.  Routine eye exams. Ask your health care provider how often you should have your eyes checked.  Personal lifestyle choices, including:  Daily care of your teeth and gums.  Regular physical activity.  Eating a healthy diet.  Avoiding tobacco and drug use.  Limiting alcohol use.  Practicing safe sex.  Taking low-dose aspirin every day.  Taking vitamin and mineral supplements as recommended by your health care provider. What happens during an annual well check? The services and screenings done by your health care provider during your annual well check will depend on your age, overall health, lifestyle risk factors, and family history of disease. Counseling  Your health care provider may ask you questions about  your:  Alcohol use.  Tobacco use.  Drug use.  Emotional well-being.  Home and relationship well-being.  Sexual activity.  Eating habits.  History of falls.  Memory and ability to understand (cognition).  Work and work Statistician.  Reproductive health. Screening  You may have the following tests or measurements:  Height, weight, and BMI.  Blood pressure.  Lipid and cholesterol levels. These may be checked every 5 years, or more frequently if you are over 62 years old.  Skin check.  Lung cancer screening. You may have this screening every year starting at age 41 if you have a 30-pack-year history of smoking and currently smoke or have quit within the past 15 years.  Fecal occult blood test (FOBT) of the stool. You may have this test every year starting at age 56.  Flexible sigmoidoscopy or colonoscopy. You may have a sigmoidoscopy every 5 years or a colonoscopy every 10 years starting at age 66.  Hepatitis C blood test.  Hepatitis B blood test.  Sexually transmitted disease (STD) testing.  Diabetes screening. This is done by checking your blood sugar (glucose) after you have not eaten for a while (fasting). You may have this done every 1-3 years.  Bone density scan. This is done to screen for osteoporosis. You may have this done starting at age 57.  Mammogram. This may be done every 1-2 years. Talk to your health care provider about how often you should have regular mammograms. Talk with your health care provider about your test results, treatment options, and if necessary, the need for more tests. Vaccines  Your health care provider may recommend certain  vaccines, such as:  Influenza vaccine. This is recommended every year.  Tetanus, diphtheria, and acellular pertussis (Tdap, Td) vaccine. You may need a Td booster every 10 years.  Zoster vaccine. You may need this after age 31.  Pneumococcal 13-valent conjugate (PCV13) vaccine. One dose is recommended  after age 42.  Pneumococcal polysaccharide (PPSV23) vaccine. One dose is recommended after age 57. Talk to your health care provider about which screenings and vaccines you need and how often you need them. This information is not intended to replace advice given to you by your health care provider. Make sure you discuss any questions you have with your health care provider. Document Released: 07/11/2015 Document Revised: 03/03/2016 Document Reviewed: 04/15/2015 Elsevier Interactive Patient Education  2017 [place] Prevention in the Home Falls can cause injuries. They can happen to people of all ages. There are many things you can do to make your home safe and to help prevent falls. What can I do on the outside of my home?  Regularly fix the edges of walkways and driveways and fix any cracks.  Remove anything that might make you trip as you walk through a door, such as a raised step or threshold.  Trim any bushes or trees on the path to your home.  Use bright outdoor lighting.  Clear any walking paths of anything that might make someone trip, such as rocks or tools.  Regularly check to see if handrails are loose or broken. Make sure that both sides of any steps have handrails.  Any raised decks and porches should have guardrails on the edges.  Have any leaves, snow, or ice cleared regularly.  Use sand or salt on walking paths during winter.  Clean up any spills in your garage right away. This includes oil or grease spills. What can I do in the bathroom?  Use night lights.  Install grab bars by the toilet and in the tub and shower. Do not use towel bars as grab bars.  Use non-skid mats or decals in the tub or shower.  If you need to sit down in the shower, use a plastic, non-slip stool.  Keep the floor dry. Clean up any water that spills on the floor as soon as it happens.  Remove soap buildup in the tub or shower regularly.  Attach bath mats securely with  double-sided non-slip rug tape.  Do not have throw rugs and other things on the floor that can make you trip. What can I do in the bedroom?  Use night lights.  Make sure that you have a light by your bed that is easy to reach.  Do not use any sheets or blankets that are too big for your bed. They should not hang down onto the floor.  Have a firm chair that has side arms. You can use this for support while you get dressed.  Do not have throw rugs and other things on the floor that can make you trip. What can I do in the kitchen?  Clean up any spills right away.  Avoid walking on wet floors.  Keep items that you use a lot in easy-to-reach places.  If you need to reach something above you, use a strong step stool that has a grab bar.  Keep electrical cords out of the way.  Do not use floor polish or wax that makes floors slippery. If you must use wax, use non-skid floor wax.  Do not have throw rugs and other things  on the floor that can make you trip. What can I do with my stairs?  Do not leave any items on the stairs.  Make sure that there are handrails on both sides of the stairs and use them. Fix handrails that are broken or loose. Make sure that handrails are as long as the stairways.  Check any carpeting to make sure that it is firmly attached to the stairs. Fix any carpet that is loose or worn.  Avoid having throw rugs at the top or bottom of the stairs. If you do have throw rugs, attach them to the floor with carpet tape.  Make sure that you have a light switch at the top of the stairs and the bottom of the stairs. If you do not have them, ask someone to add them for you. What else can I do to help prevent falls?  Wear shoes that:  Do not have high heels.  Have rubber bottoms.  Are comfortable and fit you well.  Are closed at the toe. Do not wear sandals.  If you use a stepladder:  Make sure that it is fully opened. Do not climb a closed stepladder.  Make  sure that both sides of the stepladder are locked into place.  Ask someone to hold it for you, if possible.  Clearly mark and make sure that you can see:  Any grab bars or handrails.  First and last steps.  Where the edge of each step is.  Use tools that help you move around (mobility aids) if they are needed. These include:  Canes.  Walkers.  Scooters.  Crutches.  Turn on the lights when you go into a dark area. Replace any light bulbs as soon as they burn out.  Set up your furniture so you have a clear path. Avoid moving your furniture around.  If any of your floors are uneven, fix them.  If there are any pets around you, be aware of where they are.  Review your medicines with your doctor. Some medicines can make you feel dizzy. This can increase your chance of falling. Ask your doctor what other things that you can do to help prevent falls. This information is not intended to replace advice given to you by your health care provider. Make sure you discuss any questions you have with your health care provider. Document Released: 04/10/2009 Document Revised: 11/20/2015 Document Reviewed: 07/19/2014 Elsevier Interactive Patient Education  2017 [employer].

## 2018-03-20 NOTE — Progress Notes (Signed)
Subjective:   Whitney Donaldson is a 84 y.o. female who presents for Medicare Annual (Subsequent) preventive examination.  Last AWV-02/10/2017    Objective:     Vitals: BP (!) 118/56 (BP Location: Left Arm, Patient Position: Sitting)   Pulse 90   Temp 97.7 F (36.5 C) (Oral)   Ht 5\' 2"  (1.575 m)   Wt 101 lb (45.8 kg)   SpO2 98%   BMI 18.47 kg/m   Body mass index is 18.47 kg/m.  Advanced Directives 03/20/2018 12/23/2017 06/24/2017 03/14/2017 03/14/2017 01/25/2017 12/17/2016  Does Patient Have a Medical Advance Directive? Yes Yes Yes No Yes Yes Yes  Type of Paramedic of [place];Out of facility DNR (pink MOST or yellow form) [place];Out of facility DNR (pink MOST or yellow form) Out of facility DNR (pink MOST or yellow form) - Press photographer;Living will [place];Living will [place];Living will  Does patient want to make changes to medical advance directive? No - Patient declined - - - No - Patient declined - -  Copy of [place] in Chart? Yes No - copy requested - - No - copy requested No - copy requested -  Would patient like information on creating a medical advance directive? - - - - - - -  Pre-existing out of facility DNR order (yellow form or pink MOST form) Yellow form placed in chart (order not valid for inpatient use) Yellow form placed in chart (order not valid for inpatient use) Yellow form placed in chart (order not valid for inpatient use) - - - -    Tobacco Social History   Tobacco Use  Smoking Status Never Smoker  Smokeless Tobacco Never Used  Tobacco Comment   NEVER USED TOBACCO     Counseling given: Not Answered Comment: NEVER USED TOBACCO   Clinical Intake:  Pre-visit preparation completed: No  Pain : No/denies pain     Diabetes: No  How often do you need to have someone help you when you read instructions, pamphlets, or other written  materials from your doctor or pharmacy?: 1 - Never What is the last grade level you completed in school?: high school  Interpreter Needed?: No  Information entered by :: [name], RN  Past Medical History:  Diagnosis Date  . Arthritis    Bilateral hands  . Cancer ([place])    breast hx of, skin hx of  . Hyperlipidemia   . Nephrolithiasis     X1  . Osteopenia   . Positive H. pylori test 01/27/2017   Past Surgical History:  Procedure Laterality Date  . COLONOSCOPY  2004    negative; Dr Fuller Plan  . colonoscopy with polypectomy  2014  . mastestomy  2003   partial /left  . SKIN CANCER EXCISION     chest , Dr Ubaldo Glassing   Family History  Problem Relation Age of Onset  . Heart attack Mother 33  . Heart disease Mother   . Lung cancer Father        smoker  . COPD Father   . Cancer Father   . Stroke Sister 74  . Alzheimer's disease Sister   . Heart attack Brother 18  . Heart disease Brother   . Diabetes Neg Hx    Social History   Socioeconomic History  . Marital status: Married    Spouse name: Not on file  . Number of children: Not on file  . Years  of education: Not on file  . Highest education level: Not on file  Occupational History  . Not on file  Social Needs  . Financial resource strain: Not hard at all  . Food insecurity:    Worry: Never true    Inability: Never true  . Transportation needs:    Medical: No    Non-medical: No  Tobacco Use  . Smoking status: Never Smoker  . Smokeless tobacco: Never Used  . Tobacco comment: NEVER USED TOBACCO  Substance and Sexual Activity  . Alcohol use: No    Alcohol/week: 0.0 standard drinks  . Drug use: No  . Sexual activity: Not Currently  Lifestyle  . Physical activity:    Days per week: 0 days    Minutes per session: 0 min  . Stress: To some extent  Relationships  . Social connections:    Talks on phone: More than three times a week    Gets together: More than three times a week    Attends religious service:  More than 4 times per year    Active member of club or organization: No    Attends meetings of clubs or organizations: Never    Relationship status: Married  Other Topics Concern  . Not on file  Social History Narrative   Social History      Diet? Healthy diet      Do you drink/eat things with caffeine? Very little      Marital status?            married                        What year were you married? 1957      Do you live in a house, apartment, assisted living, condo, trailer, etc.? house      Is it one or more stories? 2      How many persons live in your home? 2      Do you have any pets in your home? (please list) dog and cat      Highest level of education completed? High school      Current or past profession: Architect      Advanced Directives      Do you exercise?         Very little                             Type & how often? walk      Do you have a living will? yes      Do you have a DNR form?       no                           If not, do you want to discuss one? no      Do you have signed POA/HPOA for forms? yes      Functional Status      Do you have difficulty bathing or dressing yourself? no      Do you have difficulty preparing food or eating? no      Do you have difficulty managing your medications? no      Do you have difficulty managing your finances? no      Do you have difficulty affording your medications? no    Outpatient Encounter Medications as of 03/20/2018  Medication  Sig  . aspirin 81 MG tablet Take 81 mg by mouth daily.    . Calcium Carbonate-Vitamin D (CALCIUM + D PO) Take by mouth daily.   . Cholecalciferol (VITAMIN D3) 1000 UNITS CAPS Take by mouth 2 (two) times daily.   . Coenzyme Q10 (COQ10 PO) Take 1 tablet by mouth daily.  . cyanocobalamin 500 MCG tablet Take 500 mcg by mouth daily. Take 2 pills daily  . FOLIC ACID PO Take 578 mg by mouth daily.   . Multiple Vitamin (MULTIVITAMIN) tablet Take 1 tablet by  mouth daily.    . Multiple Vitamins-Minerals (VISION-VITE PRESERVE PO) Take by mouth.  . pantoprazole (PROTONIX) 40 MG tablet TAKE 1 TABLET BY MOUTH ONCE DAILY  . rosuvastatin (CRESTOR) 20 MG tablet TAKE 1 TABLET BY MOUTH AT BEDTIME  . vitamin C (ASCORBIC ACID) 250 MG tablet Take 500 mg by mouth daily.   . Vitamin E 400 UNITS TABS Take by mouth daily.  . mirabegron ER (MYRBETRIQ) 25 MG TB24 tablet Take 1 tablet (25 mg total) by mouth daily. (Patient not taking: Reported on 03/20/2018)   No facility-administered encounter medications on file as of 03/20/2018.     Activities of Daily Living In your present state of health, do you have any difficulty performing the following activities: 03/20/2018  Hearing? N  Vision? Y  Comment macular degeneration  Difficulty concentrating or making decisions? N  Walking or climbing stairs? N  Dressing or bathing? N  Doing errands, shopping? N  Preparing Food and eating ? N  Using the Toilet? N  In the past six months, have you accidently leaked urine? Y  Do you have problems with loss of bowel control? N  Managing your Medications? N  Managing your Finances? N  Housekeeping or managing your Housekeeping? N  Some recent data might be hidden    Patient Care Team: Lauree Chandler, NP as PCP - General (Geriatric Medicine) Renette Butters, MD as Consulting Physician (Orthopedic Surgery) Rolm Bookbinder, MD as Consulting Physician (Dermatology) Kathie Rhodes, MD as Consulting Physician (Urology) Marin Olp Rudell Cobb, MD as Consulting Physician (Oncology)    Assessment:   This is a routine wellness examination for [place].  Exercise Activities and Dietary recommendations Current Exercise Habits: The patient does not participate in regular exercise at present, Exercise limited by: None identified  Goals      Patient Stated   . patient (pt-stated)     Reduce stress by getting out more      Other   . Exercise 3x per week (30 min per time)      Patient will do more outside work and go to the gym using silver sneaker       Fall Risk Fall Risk  03/20/2018 12/23/2017 10/06/2017 09/08/2017 06/24/2017  Falls in the past year? Yes No No No Yes  Comment - - - - -  Number falls in past yr: 1 - - - 1  Comment - - - - -  Injury with Fall? Yes - - - Yes  Comment tripped, laceration on face - - - -   Is the patient's home free of loose throw rugs in walkways, pet beds, electrical cords, etc?   yes      Grab bars in the bathroom? yes      Handrails on the stairs?   yes      Adequate lighting?   yes  Timed Get Up and Go performed: 12 seconds  Depression Screen PHQ 2/9  Scores 03/20/2018 03/14/2017 01/25/2017 11/16/2016  PHQ - 2 Score 1 0 0 0     Cognitive Function MMSE - Mini Mental State Exam 03/20/2018 03/14/2017  Orientation to time 5 5  Orientation to Place 5 5  Registration 3 3  Attention/ Calculation 5 5  Recall 3 3  Language- name 2 objects 2 2  Language- repeat 1 1  Language- follow 3 step command 3 3  Language- read & follow direction 1 1  Write a sentence 1 1  Copy design 1 1  Total score 30 30        Immunization History  Administered Date(s) Administered  . Influenza Split 04/07/2011  . Influenza Whole 06/01/2007, 04/04/2008, 04/10/2009  . Influenza, High Dose Seasonal PF 04/19/2014, 03/27/2015, 04/14/2016, 03/14/2017, 03/20/2018  . Pneumococcal Conjugate-13 12/05/2014  . Pneumococcal Polysaccharide-23 07/30/2011  . Tdap 08/22/2012  . Zoster 04/15/2011  . Zoster Recombinat (Shingrix) 12/23/2017    Qualifies for Shingles Vaccine? Waiting for second shot  Screening Tests Health Maintenance  Topic Date Due  . MAMMOGRAM  05/30/2018  . TETANUS/TDAP  08/22/2022  . INFLUENZA VACCINE  Completed  . DEXA SCAN  Completed  . PNA vac Low Risk Adult  Completed    Cancer Screenings: Lung: Low Dose CT Chest recommended if Age 37-80 years, 30 pack-year currently smoking OR have quit w/in 15years. Patient does not  qualify. Breast:  Up to date on Mammogram? Yes   Up to date of Bone Density/Dexa? Yes Colorectal: up to date  Additional Screenings:  Hepatitis C Screening: declined Flu vaccine due: high dose given     Plan:    I have personally reviewed and addressed the Medicare Annual Wellness questionnaire and have noted the following in the patient's chart:  A. Medical and social history B. Use of alcohol, tobacco or illicit drugs  C. Current medications and supplements D. Functional ability and status E.  Nutritional status F.  Physical activity G. Advance directives H. List of other physicians I.  Hospitalizations, surgeries, and ER visits in previous 12 months J.  [place] to include hearing, vision, cognitive, depression L. Referrals and appointments - none  In addition, I have reviewed and discussed with patient certain preventive protocols, quality metrics, and best practice recommendations. A written personalized care plan for preventive services as well as general preventive health recommendations were provided to patient.  See attached scanned questionnaire for additional information.   Signed,   [name], RN Nurse Health Advisor  Patient Concerns: none

## 2018-03-31 DIAGNOSIS — H5201 Hypermetropia, right eye: Secondary | ICD-10-CM | POA: Diagnosis not present

## 2018-03-31 DIAGNOSIS — H5212 Myopia, left eye: Secondary | ICD-10-CM | POA: Diagnosis not present

## 2018-03-31 DIAGNOSIS — H353131 Nonexudative age-related macular degeneration, bilateral, early dry stage: Secondary | ICD-10-CM | POA: Diagnosis not present

## 2018-03-31 DIAGNOSIS — H35341 Macular cyst, hole, or pseudohole, right eye: Secondary | ICD-10-CM | POA: Diagnosis not present

## 2018-03-31 DIAGNOSIS — H353121 Nonexudative age-related macular degeneration, left eye, early dry stage: Secondary | ICD-10-CM | POA: Diagnosis not present

## 2018-03-31 DIAGNOSIS — Z961 Presence of intraocular lens: Secondary | ICD-10-CM | POA: Diagnosis not present

## 2018-03-31 DIAGNOSIS — H353111 Nonexudative age-related macular degeneration, right eye, early dry stage: Secondary | ICD-10-CM | POA: Diagnosis not present

## 2018-03-31 DIAGNOSIS — H52223 Regular astigmatism, bilateral: Secondary | ICD-10-CM | POA: Diagnosis not present

## 2018-03-31 DIAGNOSIS — H524 Presbyopia: Secondary | ICD-10-CM | POA: Diagnosis not present

## 2018-04-20 DIAGNOSIS — H35371 Puckering of macula, right eye: Secondary | ICD-10-CM | POA: Diagnosis not present

## 2018-04-20 DIAGNOSIS — H353211 Exudative age-related macular degeneration, right eye, with active choroidal neovascularization: Secondary | ICD-10-CM | POA: Diagnosis not present

## 2018-04-20 DIAGNOSIS — H35341 Macular cyst, hole, or pseudohole, right eye: Secondary | ICD-10-CM | POA: Diagnosis not present

## 2018-04-20 DIAGNOSIS — H353122 Nonexudative age-related macular degeneration, left eye, intermediate dry stage: Secondary | ICD-10-CM | POA: Diagnosis not present

## 2018-05-18 DIAGNOSIS — H35341 Macular cyst, hole, or pseudohole, right eye: Secondary | ICD-10-CM | POA: Diagnosis not present

## 2018-05-18 DIAGNOSIS — H35371 Puckering of macula, right eye: Secondary | ICD-10-CM | POA: Diagnosis not present

## 2018-05-18 DIAGNOSIS — H353122 Nonexudative age-related macular degeneration, left eye, intermediate dry stage: Secondary | ICD-10-CM | POA: Diagnosis not present

## 2018-05-18 DIAGNOSIS — H353211 Exudative age-related macular degeneration, right eye, with active choroidal neovascularization: Secondary | ICD-10-CM | POA: Diagnosis not present

## 2018-06-12 ENCOUNTER — Ambulatory Visit (INDEPENDENT_AMBULATORY_CARE_PROVIDER_SITE_OTHER): Payer: Medicare HMO | Admitting: Nurse Practitioner

## 2018-06-12 ENCOUNTER — Encounter: Payer: Self-pay | Admitting: Nurse Practitioner

## 2018-06-12 VITALS — BP 122/78 | HR 84 | Temp 98.1°F | Ht 62.0 in | Wt 102.0 lb

## 2018-06-12 DIAGNOSIS — K5904 Chronic idiopathic constipation: Secondary | ICD-10-CM | POA: Diagnosis not present

## 2018-06-12 DIAGNOSIS — K2970 Gastritis, unspecified, without bleeding: Secondary | ICD-10-CM | POA: Diagnosis not present

## 2018-06-12 DIAGNOSIS — N3281 Overactive bladder: Secondary | ICD-10-CM

## 2018-06-12 DIAGNOSIS — M8000XD Age-related osteoporosis with current pathological fracture, unspecified site, subsequent encounter for fracture with routine healing: Secondary | ICD-10-CM | POA: Diagnosis not present

## 2018-06-12 DIAGNOSIS — E782 Mixed hyperlipidemia: Secondary | ICD-10-CM | POA: Diagnosis not present

## 2018-06-12 NOTE — Patient Instructions (Addendum)
  Increase fiber- can use benfiber OTC daily Add into your morning beverage  Increase water intake throughout the day  To come in for fasting blood work   Follow up in 6 months for routine follow up   Constipation, Adult Constipation is when a person has fewer bowel movements in a week than normal, has difficulty having a bowel movement, or has stools that are dry, hard, or larger than normal. Constipation may be caused by an underlying condition. It may become worse with age if a person takes certain medicines and does not take in enough fluids. Follow these instructions at home: Eating and drinking   Eat foods that have a lot of fiber, such as fresh fruits and vegetables, whole grains, and beans.  Limit foods that are high in fat, low in fiber, or overly processed, such as french fries, hamburgers, cookies, candies, and soda.  Drink enough fluid to keep your urine clear or pale yellow. General instructions  Exercise regularly or as told by your health care provider.  Go to the restroom when you have the urge to go. Do not hold it in.  Take over-the-counter and prescription medicines only as told by your health care provider. These include any fiber supplements.  Practice pelvic floor retraining exercises, such as deep breathing while relaxing the lower abdomen and pelvic floor relaxation during bowel movements.  Watch your condition for any changes.  Keep all follow-up visits as told by your health care provider. This is important. Contact a health care provider if:  You have pain that gets worse.  You have a fever.  You do not have a bowel movement after 4 days.  You vomit.  You are not hungry.  You lose weight.  You are bleeding from the anus.  You have thin, pencil-like stools. Get help right away if:  You have a fever and your symptoms suddenly get worse.  You leak stool or have blood in your stool.  Your abdomen is bloated.  You have severe pain in your  abdomen.  You feel dizzy or you faint. This information is not intended to replace advice given to you by your health care provider. Make sure you discuss any questions you have with your health care provider. Document Released: 03/12/2004 Document Revised: 01/02/2016 Document Reviewed: 12/03/2015 Elsevier Interactive Patient Education  2018 [employer].

## 2018-06-12 NOTE — Progress Notes (Signed)
Careteam: Patient Care Team: Lauree Chandler, NP as PCP - General (Geriatric Medicine) Renette Butters, MD as Consulting Physician (Orthopedic Surgery) Rolm Bookbinder, MD as Consulting Physician (Dermatology) Kathie Rhodes, MD as Consulting Physician (Urology) Marin Olp Rudell Cobb, MD as Consulting Physician (Oncology)  Advanced Directive information Does Patient Have a Medical Advance Directive?: Yes, Type of Advance Directive: Healthcare Power of Attorney  Allergies  Allergen Reactions  . Fosamax [Alendronate Sodium]     Gastritis  . Oxycodone-Acetaminophen     REACTION: nausea  . Tramadol     Nausea     Chief Complaint  Patient presents with  . Medical Management of Chronic Issues    6 month follow-up   . Quality Metric Gaps    Discuss Mammogram recommendation      HPI: Patient is a 84 y.o. female seen in the office today for medical management.   GERD- controlled on protonix and diet.   OSTEOPENIA- last year fractured pelvis, could not tolerate fosamax due to increase in GERD. Continues on cal and vit D. prolia not covered enough by insurance.   Hyperlipidemia- taking crestor 20 mg daily, no myalgias, last LDL at 117 in June. Fasting today  Overactive bladder- getting up 3-5 times a night for years-- drinks more water at night.  Tried myrbetriq but unsure why she is not taking it now. Overall does not bother her that much.   Worried about her bowels- last colonoscopy they told her it was the last she needed- sometimes they are hard and does not move everyday   Getting mammogram tomorrow.   Review of Systems:  Review of Systems  Constitutional: Negative for chills, fever, malaise/fatigue and weight loss.  HENT: Negative.   Respiratory: Negative for cough and shortness of breath.   Cardiovascular: Negative for chest pain and palpitations.  Gastrointestinal: Negative for abdominal pain, constipation, diarrhea, heartburn and nausea.  Genitourinary: Positive  for frequency (at night). Negative for dysuria.  Musculoskeletal: Negative for back pain, falls and joint pain.  Skin: Negative.   Neurological: Negative for dizziness, tingling, weakness and headaches.  Psychiatric/Behavioral: The patient does not have insomnia.     Past Medical History:  Diagnosis Date  . Arthritis    Bilateral hands  . Cancer ([place])    breast hx of, skin hx of  . Hyperlipidemia   . Nephrolithiasis     X1  . Osteopenia   . Positive H. pylori test 01/27/2017   Past Surgical History:  Procedure Laterality Date  . COLONOSCOPY  2004    negative; Dr Fuller Plan  . colonoscopy with polypectomy  2014  . mastestomy  2003   partial /left  . SKIN CANCER EXCISION     chest , Dr Ubaldo Glassing   Social History:   reports that she has never smoked. She has never used smokeless tobacco. She reports that she does not drink alcohol or use drugs.  Family History  Problem Relation Age of Onset  . Heart attack Mother 24  . Heart disease Mother   . Lung cancer Father        smoker  . COPD Father   . Cancer Father   . Stroke Sister 53  . Alzheimer's disease Sister   . Heart attack Brother 29  . Heart disease Brother   . Diabetes Neg Hx     Medications: Patient's Medications  New Prescriptions   No medications on file  Previous Medications   ASPIRIN 81 MG TABLET  Take 81 mg by mouth daily.     CALCIUM CARBONATE-VITAMIN D (CALCIUM + D PO)    Take by mouth daily.    CHOLECALCIFEROL (VITAMIN D3) 1000 UNITS CAPS    Take by mouth 2 (two) times daily.    COENZYME Q10 (COQ10 PO)    Take 1 tablet by mouth daily.   CYANOCOBALAMIN 500 MCG TABLET    Take 500 mcg by mouth daily. Take 2 pills daily   FOLIC ACID PO    Take 093 mg by mouth daily.    MULTIPLE VITAMIN (MULTIVITAMIN) TABLET    Take 1 tablet by mouth daily.     MULTIPLE VITAMINS-MINERALS (VISION-VITE PRESERVE PO)    Take by mouth.   PANTOPRAZOLE (PROTONIX) 40 MG TABLET    TAKE 1 TABLET BY MOUTH ONCE DAILY   ROSUVASTATIN  (CRESTOR) 20 MG TABLET    TAKE 1 TABLET BY MOUTH AT BEDTIME   VITAMIN C (ASCORBIC ACID) 250 MG TABLET    Take 500 mg by mouth daily.    VITAMIN E 400 UNITS TABS    Take by mouth daily.  Modified Medications   No medications on file  Discontinued Medications   MIRABEGRON ER (MYRBETRIQ) 25 MG TB24 TABLET    Take 1 tablet (25 mg total) by mouth daily.     Physical Exam:  Vitals:   06/12/18 1013  BP: 122/78  Pulse: 84  Temp: 98.1 F (36.7 C)  TempSrc: Oral  SpO2: 96%  Weight: 102 lb (46.3 kg)  Height: 5\' 2"  (1.575 m)   Body mass index is 18.66 kg/m.  Physical Exam Constitutional:      Appearance: She is well-developed.  HENT:     Head: Normocephalic and atraumatic.     Nose: Nose normal.     Mouth/Throat:     Pharynx: No oropharyngeal exudate.  Eyes:     Pupils: Pupils are equal, round, and reactive to light.  Neck:     Musculoskeletal: Normal range of motion and neck supple.  Cardiovascular:     Rate and Rhythm: Normal rate and regular rhythm.     Heart sounds: Normal heart sounds.  Pulmonary:     Effort: Pulmonary effort is normal.     Breath sounds: Normal breath sounds.  Abdominal:     General: Bowel sounds are normal. There is no distension.     Palpations: Abdomen is soft.     Tenderness: There is no abdominal tenderness.  Musculoskeletal: Normal range of motion.  Skin:    General: Skin is warm and dry.  Neurological:     Mental Status: She is alert and oriented to person, place, and time.  Psychiatric:        Behavior: Behavior normal.        Thought Content: Thought content normal.        Judgment: Judgment normal.     Labs reviewed: Basic Metabolic Panel: Recent Labs    12/22/17 0933  NA 141  K 4.5  CL 104  CO2 32  GLUCOSE 99  BUN 25*  CREATININE 0.81  CALCIUM 9.5   Liver Function Tests: Recent Labs    12/22/17 0933  AST 22  ALT 22  ALKPHOS 58  BILITOT 0.5  PROT 6.9  ALBUMIN 4.3   No results for input(s): LIPASE, AMYLASE in the  last 8760 hours. No results for input(s): AMMONIA in the last 8760 hours. CBC: Recent Labs    12/22/17 0933  WBC 4.2  NEUTROABS 2.7  HGB 13.8  HCT 42.1  MCV 96.8  PLT 147   Lipid Panel: Recent Labs    12/23/17 1052  CHOL 204*  HDL 72  LDLCALC 117*  TRIG 63  CHOLHDL 2.8   TSH: No results for input(s): TSH in the last 8760 hours. A1C: Lab Results  Component Value Date   HGBA1C 6.2 11/29/2013     Assessment/Plan 1. Mixed hyperlipidemia Currently on crestor daily, diet modifications encouraged  - Lipid Panel; Future - COMPLETE METABOLIC PANEL WITH GFR; Future  2. OAB (overactive bladder) -stable, mostly bothering her at night but does not have trouble getting back to sleep. Off medication at this time. Does not feel like it is bad enough to restart  3. Osteoporosis with current pathological fracture with routine healing, unspecified osteoporosis type, subsequent encounter -continue cal and vit D, did not tolerate fosamax due to GERD. Interested in prolia at this time.   4. Gastritis without bleeding, unspecified chronicity, unspecified gastritis type Stable, without symptoms at this time. Continues on protonix  5. Chronic idiopathic constipation Discussed dietary modifications, encouraged proper hydration with water. To increase fiber.   Next appt: fasting labs next week, follow up in 6 months.  Carlos American. [place] [phone]

## 2018-06-13 DIAGNOSIS — Z1231 Encounter for screening mammogram for malignant neoplasm of breast: Secondary | ICD-10-CM | POA: Diagnosis not present

## 2018-06-13 DIAGNOSIS — Z853 Personal history of malignant neoplasm of breast: Secondary | ICD-10-CM | POA: Diagnosis not present

## 2018-06-13 LAB — HM MAMMOGRAPHY

## 2018-06-14 ENCOUNTER — Encounter: Payer: Self-pay | Admitting: *Deleted

## 2018-06-19 ENCOUNTER — Other Ambulatory Visit: Payer: Medicare HMO

## 2018-06-19 DIAGNOSIS — E782 Mixed hyperlipidemia: Secondary | ICD-10-CM | POA: Diagnosis not present

## 2018-06-19 LAB — COMPLETE METABOLIC PANEL WITH GFR
AG Ratio: 2.1 (calc) (ref 1.0–2.5)
ALBUMIN MSPROF: 4.4 g/dL (ref 3.6–5.1)
ALT: 18 U/L (ref 6–29)
AST: 18 U/L (ref 10–35)
Alkaline phosphatase (APISO): 61 U/L (ref 33–130)
BILIRUBIN TOTAL: 0.5 mg/dL (ref 0.2–1.2)
BUN: 21 mg/dL (ref 7–25)
CHLORIDE: 104 mmol/L (ref 98–110)
CO2: 32 mmol/L (ref 20–32)
CREATININE: 0.66 mg/dL (ref 0.60–0.88)
Calcium: 9.5 mg/dL (ref 8.6–10.4)
GFR, EST AFRICAN AMERICAN: 95 mL/min/{1.73_m2} (ref >=60)
GFR, Est Non African American: 82 mL/min/{1.73_m2} (ref >=60)
GLOBULIN: 2.1 g/dL (ref 1.9–3.7)
GLUCOSE: 106 mg/dL — AB (ref 65–99)
Potassium: 4.3 mmol/L (ref 3.5–5.3)
SODIUM: 142 mmol/L (ref 135–146)
TOTAL PROTEIN: 6.5 g/dL (ref 6.1–8.1)

## 2018-06-19 LAB — LIPID PANEL
CHOL/HDL RATIO: 2.6 (calc) (ref <5.0)
Cholesterol: 200 mg/dL — ABNORMAL HIGH (ref <200)
HDL: 77 mg/dL (ref >50)
LDL Cholesterol (Calc): 108 mg/dL (calc) — ABNORMAL HIGH
Non-HDL Cholesterol (Calc): 123 mg/dL (calc) (ref <130)
Triglycerides: 64 mg/dL (ref <150)

## 2018-06-23 ENCOUNTER — Encounter: Payer: Self-pay | Admitting: Nurse Practitioner

## 2018-06-23 ENCOUNTER — Encounter: Payer: Self-pay | Admitting: *Deleted

## 2018-06-23 DIAGNOSIS — M810 Age-related osteoporosis without current pathological fracture: Secondary | ICD-10-CM | POA: Insufficient documentation

## 2018-06-26 ENCOUNTER — Ambulatory Visit: Payer: Medicare HMO | Admitting: Nurse Practitioner

## 2018-06-26 DIAGNOSIS — H353211 Exudative age-related macular degeneration, right eye, with active choroidal neovascularization: Secondary | ICD-10-CM | POA: Diagnosis not present

## 2018-06-26 DIAGNOSIS — H35033 Hypertensive retinopathy, bilateral: Secondary | ICD-10-CM | POA: Diagnosis not present

## 2018-06-26 DIAGNOSIS — H35341 Macular cyst, hole, or pseudohole, right eye: Secondary | ICD-10-CM | POA: Diagnosis not present

## 2018-06-26 DIAGNOSIS — H353122 Nonexudative age-related macular degeneration, left eye, intermediate dry stage: Secondary | ICD-10-CM | POA: Diagnosis not present

## 2018-07-01 ENCOUNTER — Other Ambulatory Visit: Payer: Self-pay | Admitting: Nurse Practitioner

## 2018-07-01 DIAGNOSIS — A048 Other specified bacterial intestinal infections: Secondary | ICD-10-CM

## 2018-07-01 DIAGNOSIS — R198 Other specified symptoms and signs involving the digestive system and abdomen: Secondary | ICD-10-CM

## 2018-07-01 DIAGNOSIS — R0989 Other specified symptoms and signs involving the circulatory and respiratory systems: Secondary | ICD-10-CM

## 2018-07-01 DIAGNOSIS — E782 Mixed hyperlipidemia: Secondary | ICD-10-CM

## 2018-07-17 DIAGNOSIS — R69 Illness, unspecified: Secondary | ICD-10-CM | POA: Diagnosis not present

## 2018-07-19 ENCOUNTER — Ambulatory Visit: Payer: Medicare HMO

## 2018-07-19 DIAGNOSIS — M8000XD Age-related osteoporosis with current pathological fracture, unspecified site, subsequent encounter for fracture with routine healing: Secondary | ICD-10-CM | POA: Diagnosis not present

## 2018-07-19 MED ORDER — DENOSUMAB 60 MG/ML ~~LOC~~ SOSY
60.0000 mg | PREFILLED_SYRINGE | Freq: Once | SUBCUTANEOUS | Status: AC
  Administered 2018-07-19: 60 mg via SUBCUTANEOUS

## 2018-07-19 NOTE — Progress Notes (Signed)
Patient tolerated injection well. Patient was observed for 15 - 20 minutes and reported no adverse side effects or symptoms.

## 2018-07-31 DIAGNOSIS — H353211 Exudative age-related macular degeneration, right eye, with active choroidal neovascularization: Secondary | ICD-10-CM | POA: Diagnosis not present

## 2018-07-31 DIAGNOSIS — H353122 Nonexudative age-related macular degeneration, left eye, intermediate dry stage: Secondary | ICD-10-CM | POA: Diagnosis not present

## 2018-09-04 DIAGNOSIS — H35033 Hypertensive retinopathy, bilateral: Secondary | ICD-10-CM | POA: Diagnosis not present

## 2018-09-04 DIAGNOSIS — H353211 Exudative age-related macular degeneration, right eye, with active choroidal neovascularization: Secondary | ICD-10-CM | POA: Diagnosis not present

## 2018-09-04 DIAGNOSIS — H35341 Macular cyst, hole, or pseudohole, right eye: Secondary | ICD-10-CM | POA: Diagnosis not present

## 2018-09-04 DIAGNOSIS — H353122 Nonexudative age-related macular degeneration, left eye, intermediate dry stage: Secondary | ICD-10-CM | POA: Diagnosis not present

## 2018-09-12 ENCOUNTER — Ambulatory Visit (INDEPENDENT_AMBULATORY_CARE_PROVIDER_SITE_OTHER): Payer: Medicare HMO | Admitting: Family

## 2018-09-12 ENCOUNTER — Other Ambulatory Visit: Payer: Self-pay

## 2018-09-12 ENCOUNTER — Encounter: Payer: Self-pay | Admitting: Family

## 2018-09-12 VITALS — BP 118/72 | HR 85 | Temp 97.7°F | Ht 62.0 in | Wt 101.2 lb

## 2018-09-12 DIAGNOSIS — W19XXXA Unspecified fall, initial encounter: Secondary | ICD-10-CM

## 2018-09-12 DIAGNOSIS — R58 Hemorrhage, not elsewhere classified: Secondary | ICD-10-CM

## 2018-09-12 DIAGNOSIS — Y92009 Unspecified place in unspecified non-institutional (private) residence as the place of occurrence of the external cause: Secondary | ICD-10-CM | POA: Diagnosis not present

## 2018-09-12 LAB — COMPLETE METABOLIC PANEL WITH GFR
AG Ratio: 2.2 (calc) (ref 1.0–2.5)
ALT: 18 U/L (ref 6–29)
AST: 21 U/L (ref 10–35)
Albumin: 4.1 g/dL (ref 3.6–5.1)
Alkaline phosphatase (APISO): 47 U/L (ref 37–153)
BUN: 20 mg/dL (ref 7–25)
CALCIUM: 8.7 mg/dL (ref 8.6–10.4)
CO2: 29 mmol/L (ref 20–32)
Chloride: 107 mmol/L (ref 98–110)
Creat: 0.67 mg/dL (ref 0.60–0.88)
GFR, Est African American: 94 mL/min/{1.73_m2} (ref >=60)
GFR, Est Non African American: 81 mL/min/{1.73_m2} (ref >=60)
Globulin: 1.9 g/dL (calc) (ref 1.9–3.7)
Glucose, Bld: 126 mg/dL (ref 65–139)
Potassium: 4.5 mmol/L (ref 3.5–5.3)
Sodium: 142 mmol/L (ref 135–146)
Total Bilirubin: 0.4 mg/dL (ref 0.2–1.2)
Total Protein: 6 g/dL — ABNORMAL LOW (ref 6.1–8.1)

## 2018-09-12 LAB — CBC WITH DIFFERENTIAL/PLATELET
Absolute Monocytes: 532 cells/uL (ref 200–950)
Basophils Absolute: 20 cells/uL (ref 0–200)
Basophils Relative: 0.5 %
Eosinophils Absolute: 60 cells/uL (ref 15–500)
Eosinophils Relative: 1.5 %
HEMATOCRIT: 38 % (ref 35.0–45.0)
Hemoglobin: 12.9 g/dL (ref 11.7–15.5)
Lymphs Abs: 824 cells/uL — ABNORMAL LOW (ref 850–3900)
MCH: 31.8 pg (ref 27.0–33.0)
MCHC: 33.9 g/dL (ref 32.0–36.0)
MCV: 93.6 fL (ref 80.0–100.0)
MPV: 12 fL (ref 7.5–12.5)
Monocytes Relative: 13.3 %
NEUTROS ABS: 2564 {cells}/uL (ref 1500–7800)
Neutrophils Relative %: 64.1 %
Platelets: 131 10*3/uL — ABNORMAL LOW (ref 140–400)
RBC: 4.06 10*6/uL (ref 3.80–5.10)
RDW: 11.7 % (ref 11.0–15.0)
Total Lymphocyte: 20.6 %
WBC: 4 10*3/uL (ref 3.8–10.8)

## 2018-09-12 NOTE — Progress Notes (Signed)
Provider: Abdias Hickam FNP-C  Lauree Chandler, NP  Patient Care Team: Lauree Chandler, NP as PCP - General (Geriatric Medicine) Renette Butters, MD as Consulting Physician (Orthopedic Surgery) Rolm Bookbinder, MD as Consulting Physician (Dermatology) Kathie Rhodes, MD as Consulting Physician (Urology) Marin Olp Rudell Cobb, MD as Consulting Physician (Oncology)  Extended Emergency Contact Information Primary Emergency Contact: Frew,Loyd Address: [address]          [place] [address] [place] Phone: [phone] Relation: Spouse  Goals of care: Advanced Directive information Advanced Directives 06/12/2018  Does Patient Have a Medical Advance Directive? Yes  Type of Advance Directive [place]  Does patient want to make changes to medical advance directive? -  Copy of [place] in Chart? Yes - validated most recent copy scanned in chart (See row information)  Would patient like information on creating a medical advance directive? -  Pre-existing out of facility DNR order (yellow form or pink MOST form) -     Chief Complaint  Patient presents with   Acute Visit    [address] last night and hit head states she was sitting in chair and lost balance and fell and hit head on brick hearth     HPI:  Pt is a 85 y.o. female seen today  for an acute visit for evaluation of fall.she is here escorted by her granddaughter.she states fell last night hitting her head.she states was sitting on her chair lost balance hitting her head on the edge of brick.she noticed blood on her right side of her head.she was alone with her dog when the fall occurred.she does not recall feeling dizziness,lightheaded,faint or having any nausea or vomiting.she denies any symptoms or signs of urinary tract infection.    Past Medical History:  Diagnosis Date   Arthritis    Bilateral hands   Cancer ([place])    breast hx of, skin hx of    Hyperlipidemia    Nephrolithiasis     X1   Osteopenia    Positive H. pylori test 01/27/2017   Past Surgical History:  Procedure Laterality Date   COLONOSCOPY  2004    negative; Dr Fuller Plan   colonoscopy with polypectomy  2014   mastestomy  2003   partial /left   SKIN CANCER EXCISION     chest , Dr Ubaldo Glassing    Allergies  Allergen Reactions   Fosamax [Alendronate Sodium]     Gastritis   Oxycodone-Acetaminophen     REACTION: nausea   Tramadol     Nausea     Outpatient Encounter Medications as of 09/12/2018  Medication Sig   aspirin 81 MG tablet Take 81 mg by mouth daily.     Calcium Carbonate-Vitamin D (CALCIUM + D PO) Take by mouth daily.    Cholecalciferol (VITAMIN D3) 1000 UNITS CAPS Take by mouth 2 (two) times daily.    Coenzyme Q10 (COQ10 PO) Take 1 tablet by mouth daily.   cyanocobalamin 500 MCG tablet Take 500 mcg by mouth daily. Take 2 pills daily   FOLIC ACID PO Take 427 mg by mouth daily.    Multiple Vitamin (MULTIVITAMIN) tablet Take 1 tablet by mouth daily.     Multiple Vitamins-Minerals (VISION-VITE PRESERVE PO) Take by mouth.   pantoprazole (PROTONIX) 40 MG tablet TAKE 1 TABLET BY MOUTH ONCE DAILY   rosuvastatin (CRESTOR) 20 MG tablet TAKE 1 TABLET BY MOUTH AT BEDTIME   vitamin C (ASCORBIC ACID) 250 MG tablet  Take 500 mg by mouth daily.    Vitamin E 400 UNITS TABS Take by mouth daily.   No facility-administered encounter medications on file as of 09/12/2018.     Review of Systems  Constitutional: Negative for appetite change, chills, fatigue, fever and unexpected weight change.  HENT: Negative for congestion, postnasal drip, rhinorrhea, sinus pressure, sinus pain, sneezing and sore throat.   Eyes: Negative for discharge, redness, itching and visual disturbance.  Respiratory: Negative for cough, chest tightness, shortness of breath and wheezing.   Cardiovascular: Negative for chest pain, palpitations and leg swelling.  Gastrointestinal:  Negative for abdominal distention, abdominal pain, constipation, diarrhea, nausea and vomiting.  Endocrine: Negative for cold intolerance, heat intolerance, polydipsia, polyphagia and polyuria.  Genitourinary: Negative for difficulty urinating, dysuria, flank pain, frequency and urgency.  Skin: Negative for color change, pallor and rash.  Neurological: Negative for dizziness, syncope, weakness, light-headedness and headaches.  Hematological: Does not bruise/bleed easily.  Psychiatric/Behavioral: Negative for agitation, confusion and sleep disturbance. The patient is not nervous/anxious.     Immunization History  Administered Date(s) Administered   Influenza Split 04/07/2011   Influenza Whole 06/01/2007, 04/04/2008, 04/10/2009   Influenza, High Dose Seasonal PF 04/19/2014, 03/27/2015, 04/14/2016, 03/14/2017, 03/20/2018   Pneumococcal Conjugate-13 12/05/2014   Pneumococcal Polysaccharide-23 07/30/2011   Tdap 08/22/2012   Zoster 04/15/2011   Zoster Recombinat (Shingrix) 12/23/2017, 02/26/2018   Pertinent  Health Maintenance Due  Topic Date Due   MAMMOGRAM  06/14/2019   INFLUENZA VACCINE  Completed   DEXA SCAN  Completed   PNA vac Low Risk Adult  Completed   Fall Risk  09/12/2018 06/12/2018 03/20/2018 12/23/2017 10/06/2017  Falls in the past year? 1 1 Yes No No  Comment - - - - -  Number falls in past yr: 0 0 1 - -  Comment - Slipped on pine needles  - - -  Injury with Fall? 1 0 Yes - -  Comment - - tripped, laceration on face - -    Vitals:   09/12/18 1447  BP: 118/72  Pulse: 85  Temp: 97.7 F (36.5 C)  TempSrc: Oral  SpO2: 98%  Weight: 101 lb 3.2 oz (45.9 kg)  Height: '5\' 2"'$  (1.575 m)   Body mass index is 18.51 kg/m. Physical Exam Vitals signs reviewed.  Constitutional:      General: She is not in acute distress.    Appearance: She is underweight. She is not ill-appearing.  HENT:     Head: Normocephalic.     Right Ear: Tympanic membrane, ear canal and  external ear normal. There is no impacted cerumen.     Left Ear: Tympanic membrane, ear canal and external ear normal. There is no impacted cerumen.     Nose: No congestion or rhinorrhea.     Mouth/Throat:     Mouth: Mucous membranes are moist.     Pharynx: Oropharynx is clear. No oropharyngeal exudate or posterior oropharyngeal erythema.  Eyes:     General: No scleral icterus.       Right eye: No discharge.        Left eye: No discharge.     Extraocular Movements: Extraocular movements intact.     Conjunctiva/sclera: Conjunctivae normal.     Pupils: Pupils are equal, round, and reactive to light.  Neck:     Musculoskeletal: Normal range of motion. No neck rigidity or muscular tenderness.     Vascular: No carotid bruit.  Cardiovascular:     Rate and Rhythm: Normal rate  and regular rhythm.     Pulses: Normal pulses.     Heart sounds: Normal heart sounds. No murmur. No friction rub. No gallop.   Pulmonary:     Effort: Pulmonary effort is normal. No respiratory distress.     Breath sounds: Normal breath sounds. No wheezing, rhonchi or rales.  Chest:     Chest wall: No tenderness.  Abdominal:     General: Bowel sounds are normal. There is no distension.     Palpations: Abdomen is soft. There is no mass.     Tenderness: There is no abdominal tenderness. There is no right CVA tenderness, left CVA tenderness, guarding or rebound.  Musculoskeletal: Normal range of motion.        General: No swelling or tenderness.     Right lower leg: No edema.     Left lower leg: No edema.  Lymphadenopathy:     Cervical: No cervical adenopathy.  Skin:    General: Skin is warm and dry.     Coloration: Skin is not pale.     Findings: No erythema or rash.     Comments: Right side laceration with dry blood site covered with matted dry bloody hair.Decline wetting hair to evaluate laceration states will get hair washed by hairdresser tomorrow.surrounding skin shows no signs of infections.   Neurological:       Mental Status: She is alert and oriented to person, place, and time.     Cranial Nerves: No cranial nerve deficit.     Sensory: No sensory deficit.     Motor: No weakness.     Gait: Gait normal.  Psychiatric:        Mood and Affect: Mood normal.        Behavior: Behavior normal.        Thought Content: Thought content normal.        Judgment: Judgment normal.    Labs reviewed: Recent Labs    12/22/17 0933 06/19/18 1003  NA 141 142  K 4.5 4.3  CL 104 104  CO2 32 32  GLUCOSE 99 106*  BUN 25* 21  CREATININE 0.81 0.66  CALCIUM 9.5 9.5   Recent Labs    12/22/17 0933 06/19/18 1003  AST 22 18  ALT 22 18  ALKPHOS 58  --   BILITOT 0.5 0.5  PROT 6.9 6.5  ALBUMIN 4.3  --    Recent Labs    12/22/17 0933  WBC 4.2  NEUTROABS 2.7  HGB 13.8  HCT 42.1  MCV 96.8  PLT 147   Lab Results  Component Value Date   TSH 1.42 01/25/2017   Lab Results  Component Value Date   HGBA1C 6.2 11/29/2013   Lab Results  Component Value Date   CHOL 200 (H) 06/19/2018   HDL 77 06/19/2018   LDLCALC 108 (H) 06/19/2018   LDLDIRECT 115.1 08/22/2012   TRIG 64 06/19/2018   CHOLHDL 2.6 06/19/2018    Significant Diagnostic Results in last 30 days:  No results found.  Assessment/Plan 1. Fall at home, initial encounter Afebrile.status post fall at home hitting right side of head.No loss of consciousness reported.  - CBC with Differential/Platelet - CMP with eGFR([facility])  2. Bleeding Right side laceration with dry blood site covered with matted dry bloody hair.Decline wetting hair to evaluate laceration states will get hair washed by hairdresser tomorrow.surrounding skin shows no signs of infections.   Family/ staff Communication: Reviewed plan of care with patient and facility Nurse supervisor  Labs/tests  ordered:  - CBC with Differential/Platelet - CMP with eGFR([facility])  Nelda Bucks Edelmiro Innocent, NP

## 2018-09-12 NOTE — Patient Instructions (Signed)
Fall Prevention in the Home, Adult  Falls can cause injuries. They can happen to people of all ages. There are many things you can do to make your home safe and to help prevent falls. Ask for help when making these changes, if needed.  What actions can I take to prevent falls?  General Instructions  · Use good lighting in all rooms. Replace any light bulbs that burn out.  · Turn on the lights when you go into a dark area. Use night-lights.  · Keep items that you use often in easy-to-reach places. Lower the shelves around your home if necessary.  · Set up your furniture so you have a clear path. Avoid moving your furniture around.  · Do not have throw rugs and other things on the floor that can make you trip.  · Avoid walking on wet floors.  · If any of your floors are uneven, fix them.  · Add color or contrast paint or tape to clearly mark and help you see:  ? Any grab bars or handrails.  ? First and last steps of stairways.  ? Where the edge of each step is.  · If you use a stepladder:  ? Make sure that it is fully opened. Do not climb a closed stepladder.  ? Make sure that both sides of the stepladder are locked into place.  ? Ask someone to hold the stepladder for you while you use it.  · If there are any pets around you, be aware of where they are.  What can I do in the bathroom?         · Keep the floor dry. Clean up any water that spills onto the floor as soon as it happens.  · Remove soap buildup in the tub or shower regularly.  · Use non-skid mats or decals on the floor of the tub or shower.  · Attach bath mats securely with double-sided, non-slip rug tape.  · If you need to sit down in the shower, use a plastic, non-slip stool.  · Install grab bars by the toilet and in the tub and shower. Do not use towel bars as grab bars.  What can I do in the bedroom?  · Make sure that you have a light by your bed that is easy to reach.  · Do not use any sheets or blankets that are too big for your bed. They should  not hang down onto the floor.  · Have a firm chair that has side arms. You can use this for support while you get dressed.  What can I do in the kitchen?  · Clean up any spills right away.  · If you need to reach something above you, use a strong step stool that has a grab bar.  · Keep electrical cords out of the way.  · Do not use floor polish or wax that makes floors slippery. If you must use wax, use non-skid floor wax.  What can I do with my stairs?  · Do not leave any items on the stairs.  · Make sure that you have a light switch at the top of the stairs and the bottom of the stairs. If you do not have them, ask someone to add them for you.  · Make sure that there are handrails on both sides of the stairs, and use them. Fix handrails that are broken or loose. Make sure that handrails are as long as the stairways.  ·   Install non-slip stair treads on all stairs in your home.  · Avoid having throw rugs at the top or bottom of the stairs. If you do have throw rugs, attach them to the floor with carpet tape.  · Choose a carpet that does not hide the edge of the steps on the stairway.  · Check any carpeting to make sure that it is firmly attached to the stairs. Fix any carpet that is loose or worn.  What can I do on the outside of my home?  · Use bright outdoor lighting.  · Regularly fix the edges of walkways and driveways and fix any cracks.  · Remove anything that might make you trip as you walk through a door, such as a raised step or threshold.  · Trim any bushes or trees on the path to your home.  · Regularly check to see if handrails are loose or broken. Make sure that both sides of any steps have handrails.  · Install guardrails along the edges of any raised decks and porches.  · Clear walking paths of anything that might make someone trip, such as tools or rocks.  · Have any leaves, snow, or ice cleared regularly.  · Use sand or salt on walking paths during winter.  · Clean up any spills in your garage right  away. This includes grease or oil spills.  What other actions can I take?  · Wear shoes that:  ? Have a low heel. Do not wear high heels.  ? Have rubber bottoms.  ? Are comfortable and fit you well.  ? Are closed at the toe. Do not wear open-toe sandals.  · Use tools that help you move around (mobility aids) if they are needed. These include:  ? Canes.  ? Walkers.  ? Scooters.  ? Crutches.  · Review your medicines with your doctor. Some medicines can make you feel dizzy. This can increase your chance of falling.  Ask your doctor what other things you can do to help prevent falls.  Where to find more information  · Centers for Disease Control and Prevention, [employer]: https://cdc.gov  · National Institute on Aging: https://go4life.nia.nih.gov  Contact a doctor if:  · You are afraid of falling at home.  · You feel weak, drowsy, or dizzy at home.  · You fall at home.  Summary  · There are many simple things that you can do to make your home safe and to help prevent falls.  · Ways to make your home safe include removing tripping hazards and installing grab bars in the bathroom.  · Ask for help when making these changes in your home.  This information is not intended to replace advice given to you by your health care provider. Make sure you discuss any questions you have with your health care provider.  Document Released: 04/10/2009 Document Revised: 01/27/2017 Document Reviewed: 01/27/2017  [employer]vier Interactive Patient Education © 2019 [employer]

## 2018-09-14 ENCOUNTER — Other Ambulatory Visit: Payer: Self-pay

## 2018-09-14 DIAGNOSIS — D691 Qualitative platelet defects: Secondary | ICD-10-CM

## 2018-10-09 DIAGNOSIS — H353211 Exudative age-related macular degeneration, right eye, with active choroidal neovascularization: Secondary | ICD-10-CM | POA: Diagnosis not present

## 2018-10-16 ENCOUNTER — Other Ambulatory Visit: Payer: Medicare HMO

## 2018-10-16 ENCOUNTER — Other Ambulatory Visit: Payer: Self-pay

## 2018-10-16 DIAGNOSIS — D691 Qualitative platelet defects: Secondary | ICD-10-CM | POA: Diagnosis not present

## 2018-10-16 LAB — CBC WITH DIFFERENTIAL/PLATELET
Absolute Monocytes: 473 cells/uL (ref 200–950)
Basophils Absolute: 30 cells/uL (ref 0–200)
Basophils Relative: 0.7 %
Eosinophils Absolute: 52 cells/uL (ref 15–500)
Eosinophils Relative: 1.2 %
HCT: 41.5 % (ref 35.0–45.0)
Hemoglobin: 13.9 g/dL (ref 11.7–15.5)
Lymphs Abs: 916 cells/uL (ref 850–3900)
MCH: 31.7 pg (ref 27.0–33.0)
MCHC: 33.5 g/dL (ref 32.0–36.0)
MCV: 94.7 fL (ref 80.0–100.0)
MPV: 12 fL (ref 7.5–12.5)
Monocytes Relative: 11 %
Neutro Abs: 2829 cells/uL (ref 1500–7800)
Neutrophils Relative %: 65.8 %
Platelets: 154 10*3/uL (ref 140–400)
RBC: 4.38 10*6/uL (ref 3.80–5.10)
RDW: 11.7 % (ref 11.0–15.0)
Total Lymphocyte: 21.3 %
WBC: 4.3 10*3/uL (ref 3.8–10.8)

## 2018-10-22 ENCOUNTER — Encounter: Payer: Self-pay | Admitting: Nurse Practitioner

## 2018-11-13 DIAGNOSIS — H353211 Exudative age-related macular degeneration, right eye, with active choroidal neovascularization: Secondary | ICD-10-CM | POA: Diagnosis not present

## 2018-12-12 ENCOUNTER — Other Ambulatory Visit: Payer: Self-pay

## 2018-12-12 ENCOUNTER — Ambulatory Visit (INDEPENDENT_AMBULATORY_CARE_PROVIDER_SITE_OTHER): Payer: Medicare HMO | Admitting: Nurse Practitioner

## 2018-12-12 ENCOUNTER — Encounter: Payer: Self-pay | Admitting: Nurse Practitioner

## 2018-12-12 ENCOUNTER — Ambulatory Visit: Payer: Medicare HMO | Admitting: Nurse Practitioner

## 2018-12-12 DIAGNOSIS — N3281 Overactive bladder: Secondary | ICD-10-CM

## 2018-12-12 DIAGNOSIS — R0989 Other specified symptoms and signs involving the circulatory and respiratory systems: Secondary | ICD-10-CM | POA: Diagnosis not present

## 2018-12-12 DIAGNOSIS — R69 Illness, unspecified: Secondary | ICD-10-CM | POA: Diagnosis not present

## 2018-12-12 DIAGNOSIS — K219 Gastro-esophageal reflux disease without esophagitis: Secondary | ICD-10-CM

## 2018-12-12 DIAGNOSIS — E7801 Familial hypercholesterolemia: Secondary | ICD-10-CM

## 2018-12-12 DIAGNOSIS — R634 Abnormal weight loss: Secondary | ICD-10-CM | POA: Diagnosis not present

## 2018-12-12 DIAGNOSIS — F411 Generalized anxiety disorder: Secondary | ICD-10-CM

## 2018-12-12 DIAGNOSIS — M810 Age-related osteoporosis without current pathological fracture: Secondary | ICD-10-CM

## 2018-12-12 DIAGNOSIS — R198 Other specified symptoms and signs involving the digestive system and abdomen: Secondary | ICD-10-CM

## 2018-12-12 NOTE — Progress Notes (Signed)
Patient ID: JAI BEAR, female   DOB: 1932/09/18, 85 y.o.   MRN: [id number] This service is provided via telemedicine  No vital signs collected/recorded due to the encounter was a telemedicine visit.   Location of patient (ex: home, work): Home   Patient consents to a telephone visit:  Yes  Location of the provider (ex: office, home): Office  Name of any referring provider:  N/A  Names of all persons participating in the telemedicine service and their role in the encounter:  Patient, Marisa Cyphers RMA, [name] NP  Time spent on call: 10 min    Careteam: Patient Care Team: Lauree Chandler, NP as PCP - General (Geriatric Medicine) Renette Butters, MD as Consulting Physician (Orthopedic Surgery) Rolm Bookbinder, MD as Consulting Physician (Dermatology) Kathie Rhodes, MD as Consulting Physician (Urology) Marin Olp Rudell Cobb, MD as Consulting Physician (Oncology)  Advanced Directive information Does Patient Have a Medical Advance Directive?: Yes, Does patient want to make changes to medical advance directive?: No - Patient declined  Allergies  Allergen Reactions  . Fosamax [Alendronate Sodium]     Gastritis  . Oxycodone-Acetaminophen     REACTION: nausea  . Tramadol     Nausea     Chief Complaint  Patient presents with  . Medical Management of Chronic Issues    6 Month Follow Up     HPI: Patient is a 85 y.o. female for routine follow up.   GERD- stable, on protonix and diet.   Reports she "inhales her food"  Feels it coming on. Has water with her and has to drink that which helps. Does not happen often. Unsure of the last time this happen. Maybe happens once ever 3 months. Does not want this further evaluated at this time. No increase cough and congestion.   OSTEOPENIA- last year fractured pelvis, could not tolerate fosamax due to increase in GERD. Continues on cal and vit D and prolia.   Hyperlipidemia- taking crestor 20 mg daily, no myalgias, last LDL at  108 in December. attempts low fat diet  Overactive bladder- getting up 3 times a night for years-- drinks more water at night.  Tried myrbetriq but unsure why she is not taking it now. Overall does not bother her that much. Able to go right back to sleep.   Weight loss-does not weight herself at home (scale broke) feels skinny-clothes fitting the same.  No nausea, vomiting or diarrhea.  No trouble eating. Eating 3 meals a day.   Review of Systems:  Review of Systems  Constitutional: Negative for chills, fever, malaise/fatigue and weight loss.  HENT: Negative.   Respiratory: Negative for cough and shortness of breath.   Cardiovascular: Negative for chest pain and palpitations.  Gastrointestinal: Negative for abdominal pain, constipation, diarrhea, heartburn and nausea.  Genitourinary: Positive for frequency (at night). Negative for dysuria.  Musculoskeletal: Negative for back pain, falls, joint pain and myalgias.  Skin: Negative.   Neurological: Negative for dizziness, tingling, weakness and headaches.  Psychiatric/Behavioral: Negative for depression. The patient does not have insomnia.     Past Medical History:  Diagnosis Date  . Arthritis    Bilateral hands  . Cancer ([place])    breast hx of, skin hx of  . Hyperlipidemia   . Nephrolithiasis     X1  . Osteopenia   . Positive H. pylori test 01/27/2017   Past Surgical History:  Procedure Laterality Date  . COLONOSCOPY  2004    negative; Dr Fuller Plan  .  colonoscopy with polypectomy  2014  . mastestomy  2003   partial /left  . SKIN CANCER EXCISION     chest , Dr Ubaldo Glassing   Social History:   reports that she has never smoked. She has never used smokeless tobacco. She reports that she does not drink alcohol or use drugs.  Family History  Problem Relation Age of Onset  . Heart attack Mother 53  . Heart disease Mother   . Lung cancer Father        smoker  . COPD Father   . Cancer Father   . Stroke Sister 4  . Alzheimer's  disease Sister   . Heart attack Brother 83  . Heart disease Brother   . Diabetes Neg Hx     Medications: Patient's Medications  New Prescriptions   No medications on file  Previous Medications   ASPIRIN 81 MG TABLET    Take 81 mg by mouth daily.     CALCIUM CARBONATE-VITAMIN D (CALCIUM + D PO)    Take by mouth daily.    CHOLECALCIFEROL (VITAMIN D3) 1000 UNITS CAPS    Take by mouth 2 (two) times daily.    COENZYME Q10 (COQ10 PO)    Take 1 tablet by mouth daily.   CYANOCOBALAMIN 500 MCG TABLET    Take 500 mcg by mouth daily. Take 2 pills daily   FOLIC ACID PO    Take 809 mg by mouth daily.    MULTIPLE VITAMIN (MULTIVITAMIN) TABLET    Take 1 tablet by mouth daily.     MULTIPLE VITAMINS-MINERALS (VISION-VITE PRESERVE PO)    Take by mouth.   PANTOPRAZOLE (PROTONIX) 40 MG TABLET    TAKE 1 TABLET BY MOUTH ONCE DAILY   ROSUVASTATIN (CRESTOR) 20 MG TABLET    TAKE 1 TABLET BY MOUTH AT BEDTIME   VITAMIN C (ASCORBIC ACID) 250 MG TABLET    Take 500 mg by mouth daily.    VITAMIN E 400 UNITS TABS    Take by mouth daily.  Modified Medications   No medications on file  Discontinued Medications   No medications on file    Physical Exam:  There were no vitals filed for this visit. There is no height or weight on file to calculate BMI. Wt Readings from Last 3 Encounters:  09/12/18 101 lb 3.2 oz (45.9 kg)  06/12/18 102 lb (46.3 kg)  03/20/18 101 lb (45.8 kg)      Labs reviewed: Basic Metabolic Panel: Recent Labs    12/22/17 0933 06/19/18 1003 09/12/18 1539  NA 141 142 142  K 4.5 4.3 4.5  CL 104 104 107  CO2 32 32 29  GLUCOSE 99 106* 126  BUN 25* 21 20  CREATININE 0.81 0.66 0.67  CALCIUM 9.5 9.5 8.7   Liver Function Tests: Recent Labs    12/22/17 0933 06/19/18 1003 09/12/18 1539  AST 22 18 21   ALT 22 18 18   ALKPHOS 58  --   --   BILITOT 0.5 0.5 0.4  PROT 6.9 6.5 6.0*  ALBUMIN 4.3  --   --    No results for input(s): LIPASE, AMYLASE in the last 8760 hours. No results  for input(s): AMMONIA in the last 8760 hours. CBC: Recent Labs    12/22/17 0933 09/12/18 1539 10/16/18 1033  WBC 4.2 4.0 4.3  NEUTROABS 2.7 2,564 2,829  HGB 13.8 12.9 13.9  HCT 42.1 38.0 41.5  MCV 96.8 93.6 94.7  PLT 147 131* 154   Lipid  Panel: Recent Labs    12/23/17 1052 06/19/18 1003  CHOL 204* 200*  HDL 72 77  LDLCALC 117* 108*  TRIG 63 64  CHOLHDL 2.8 2.6   TSH: No results for input(s): TSH in the last 8760 hours. A1C: Lab Results  Component Value Date   HGBA1C 6.2 11/29/2013     Assessment/Plan 1. Osteoporosis, unspecified osteoporosis type, unspecified pathological fracture presence Currently on prolia, continue cal and vit d with weight bearing activity as she can tolerate.   2. Familial hypercholesterolemia LDL stable on crestor 40 mg daily, will follow up lipids at next visit.   3. Overactive bladder Ongoing but stable. Does not feel like she needs medication at this time.   4. Anxiety state Stable at this time.   5. Globus sensation -reports this does not happen often, reports she will make note of next time it happens.   6. Weight loss -continues on supplement, does not weigh herself.   7. Gastroesophageal reflux disease, esophagitis presence not specified Stable on protonix 40 mg daily with dietary modifications.    Next appt: 6 months, will get labs at time of appt.  Carlos American. Harle Battiest  [facility] [phone]    Virtual Visit via Telephone Note  I connected with pt on 12/12/18 at 10:30 AM EDT by telephone and verified that I am speaking with the correct person using two identifiers.  Location: Patient: home Provider: office   I discussed the limitations, risks, security and privacy concerns of performing an evaluation and management service by telephone and the availability of in person appointments. I also discussed with the patient that there may be a patient responsible charge related to this  service. The patient expressed understanding and agreed to proceed.   I discussed the assessment and treatment plan with the patient. The patient was provided an opportunity to ask questions and all were answered. The patient agreed with the plan and demonstrated an understanding of the instructions.   The patient was advised to call back or seek an in-person evaluation if the symptoms worsen or if the condition fails to improve as anticipated.  I provided 18 minutes of non-face-to-face time during this encounter.  Carlos American. Harle Battiest Avs printed and mailed

## 2018-12-21 ENCOUNTER — Encounter: Payer: Self-pay | Admitting: Hematology & Oncology

## 2018-12-21 ENCOUNTER — Inpatient Hospital Stay: Payer: Medicare HMO | Attending: Hematology & Oncology | Admitting: Hematology & Oncology

## 2018-12-21 ENCOUNTER — Inpatient Hospital Stay: Payer: Medicare HMO

## 2018-12-21 ENCOUNTER — Other Ambulatory Visit: Payer: Self-pay

## 2018-12-21 VITALS — BP 149/50 | HR 81 | Temp 97.7°F | Resp 18 | Wt 91.0 lb

## 2018-12-21 DIAGNOSIS — C50011 Malignant neoplasm of nipple and areola, right female breast: Secondary | ICD-10-CM

## 2018-12-21 DIAGNOSIS — Z853 Personal history of malignant neoplasm of breast: Secondary | ICD-10-CM

## 2018-12-21 DIAGNOSIS — R634 Abnormal weight loss: Secondary | ICD-10-CM | POA: Diagnosis not present

## 2018-12-21 LAB — CMP (CANCER CENTER ONLY)
ALT: 21 U/L (ref 0–44)
AST: 22 U/L (ref 15–41)
Albumin: 4.6 g/dL (ref 3.5–5.0)
Alkaline Phosphatase: 49 U/L (ref 38–126)
Anion gap: 6 (ref 5–15)
BUN: 24 mg/dL — ABNORMAL HIGH (ref 8–23)
CO2: 32 mmol/L (ref 22–32)
Calcium: 10 mg/dL (ref 8.9–10.3)
Chloride: 102 mmol/L (ref 98–111)
Creatinine: 0.72 mg/dL (ref 0.44–1.00)
GFR, Est AFR Am: >60 mL/min (ref >60)
GFR, Estimated: >60 mL/min (ref >60)
Glucose, Bld: 108 mg/dL — ABNORMAL HIGH (ref 70–99)
Potassium: 4.3 mmol/L (ref 3.5–5.1)
Sodium: 140 mmol/L (ref 135–145)
Total Bilirubin: 0.6 mg/dL (ref 0.3–1.2)
Total Protein: 6.7 g/dL (ref 6.5–8.1)

## 2018-12-21 LAB — CBC WITH DIFFERENTIAL (CANCER CENTER ONLY)
Abs Immature Granulocytes: 0.02 10*3/uL (ref 0.00–0.07)
Basophils Absolute: 0.1 10*3/uL (ref 0.0–0.1)
Basophils Relative: 1 %
Eosinophils Absolute: 0.1 10*3/uL (ref 0.0–0.5)
Eosinophils Relative: 2 %
HCT: 45.1 % (ref 36.0–46.0)
Hemoglobin: 14.6 g/dL (ref 12.0–15.0)
Immature Granulocytes: 0 %
Lymphocytes Relative: 17 %
Lymphs Abs: 1.1 10*3/uL (ref 0.7–4.0)
MCH: 31.8 pg (ref 26.0–34.0)
MCHC: 32.4 g/dL (ref 30.0–36.0)
MCV: 98.3 fL (ref 80.0–100.0)
Monocytes Absolute: 0.6 10*3/uL (ref 0.1–1.0)
Monocytes Relative: 10 %
Neutro Abs: 4.4 10*3/uL (ref 1.7–7.7)
Neutrophils Relative %: 70 %
Platelet Count: 155 10*3/uL (ref 150–400)
RBC: 4.59 MIL/uL (ref 3.87–5.11)
RDW: 12 % (ref 11.5–15.5)
WBC Count: 6.2 10*3/uL (ref 4.0–10.5)
nRBC: 0 % (ref 0.0–0.2)

## 2018-12-21 NOTE — Progress Notes (Signed)
Hematology and Oncology Follow Up Visit  Whitney Donaldson [id number] 08/17/1932 85 y.o. 12/21/2018   Principle Diagnosis:  Stage I (T1 N0 M0) ductal carcinoma of the left breast.  Current Therapy:    Observation     Interim History:  Whitney Donaldson is back for followup.  Main problem right now is this weight loss that she has.  She saw her family doctor.  She now weighs 91 pounds.  I am not sure as to why she has lost weight.  Her husband has dementia.  She tries to accommodate his schedule.  As such, she really does not eat as much.  I told her that she probably should try some supplement that might be able to get some more calories into her.  Otherwise, she feels okay.  There is no cough.  She has had no change in bowel or bladder habits.  She has had no fever.  There is been no rashes.  She has had no nausea or vomiting.  Overall, her performance status is ECOG 2.  Medications:  Current Outpatient Medications:  .  aspirin 81 MG tablet, Take 81 mg by mouth daily.  , Disp: , Rfl:  .  Calcium Carbonate-Vitamin D (CALCIUM + D PO), Take by mouth daily. , Disp: , Rfl:  .  Cholecalciferol (VITAMIN D3) 1000 UNITS CAPS, Take by mouth 2 (two) times daily. , Disp: , Rfl:  .  Coenzyme Q10 (COQ10 PO), Take 1 tablet by mouth daily., Disp: , Rfl:  .  cyanocobalamin 500 MCG tablet, Take 500 mcg by mouth daily. Take 2 pills daily, Disp: , Rfl:  .  FOLIC ACID PO, Take 409 mg by mouth daily. , Disp: , Rfl:  .  Multiple Vitamin (MULTIVITAMIN) tablet, Take 1 tablet by mouth daily.  , Disp: , Rfl:  .  Multiple Vitamins-Minerals (VISION-VITE PRESERVE PO), Take by mouth., Disp: , Rfl:  .  pantoprazole (PROTONIX) 40 MG tablet, TAKE 1 TABLET BY MOUTH ONCE DAILY, Disp: 30 tablet, Rfl: 5 .  rosuvastatin (CRESTOR) 20 MG tablet, TAKE 1 TABLET BY MOUTH AT BEDTIME, Disp: 90 tablet, Rfl: 1 .  vitamin C (ASCORBIC ACID) 250 MG tablet, Take 500 mg by mouth daily. , Disp: , Rfl:  .  Vitamin E 400 UNITS TABS, Take by  mouth daily., Disp: , Rfl:   Allergies:  Allergies  Allergen Reactions  . Fosamax [Alendronate Sodium]     Gastritis  . Oxycodone-Acetaminophen     REACTION: nausea  . Tramadol     Nausea     Past Medical History, Surgical history, Social history, and Family History were reviewed and updated.  Review of Systems: Review of Systems  Constitutional: Negative.   HENT: Negative.   Eyes: Negative.   Respiratory: Negative.   Cardiovascular: Negative.   Gastrointestinal: Negative.   Genitourinary: Negative.   Musculoskeletal: Negative.   Skin: Negative.   Neurological: Negative.   Endo/Heme/Allergies: Negative.   Psychiatric/Behavioral: Negative.      Physical Exam:  weight is 91 lb (41.3 kg). Her oral temperature is 97.7 F (36.5 C). Her blood pressure is 149/50 (abnormal) and her pulse is 81. Her respiration is 18 and oxygen saturation is 100%.   Physical Exam Vitals signs reviewed.  HENT:     Head: Normocephalic and atraumatic.  Eyes:     Pupils: Pupils are equal, round, and reactive to light.  Neck:     Musculoskeletal: Normal range of motion.  Cardiovascular:     Rate  and Rhythm: Normal rate and regular rhythm.     Heart sounds: Normal heart sounds.  Pulmonary:     Effort: Pulmonary effort is normal.     Breath sounds: Normal breath sounds.  Abdominal:     General: Bowel sounds are normal.     Palpations: Abdomen is soft.  Musculoskeletal: Normal range of motion.        General: No tenderness or deformity.  Lymphadenopathy:     Cervical: No cervical adenopathy.  Skin:    General: Skin is warm and dry.     Findings: No erythema or rash.  Neurological:     Mental Status: She is alert and oriented to person, place, and time.  Psychiatric:        Behavior: Behavior normal.        Thought Content: Thought content normal.        Judgment: Judgment normal.      Lab Results  Component Value Date   WBC 6.2 12/21/2018   HGB 14.6 12/21/2018   HCT 45.1  12/21/2018   MCV 98.3 12/21/2018   PLT 155 12/21/2018     Chemistry      Component Value Date/Time   NA 140 12/21/2018 0908   NA 142 12/17/2016 0819   NA 142 12/18/2015 0838   K 4.3 12/21/2018 0908   K 4.3 12/17/2016 0819   K 4.0 12/18/2015 0838   CL 102 12/21/2018 0908   CL 103 12/17/2016 0819   CO2 32 12/21/2018 0908   CO2 29 12/17/2016 0819   CO2 29 12/18/2015 0838   BUN 24 (H) 12/21/2018 0908   BUN 14 12/17/2016 0819   BUN 20.9 12/18/2015 0838   CREATININE 0.72 12/21/2018 0908   CREATININE 0.67 09/12/2018 1539   CREATININE 0.8 12/18/2015 0838      Component Value Date/Time   CALCIUM 10.0 12/21/2018 0908   CALCIUM 9.6 12/17/2016 0819   CALCIUM 9.3 12/18/2015 0838   ALKPHOS 49 12/21/2018 0908   ALKPHOS 116 (H) 12/17/2016 0819   ALKPHOS 56 12/18/2015 0838   AST 22 12/21/2018 0908   AST 18 12/18/2015 0838   ALT 21 12/21/2018 0908   ALT 30 12/17/2016 0819   ALT 22 12/18/2015 0838   BILITOT 0.6 12/21/2018 0908   BILITOT 0.60 12/18/2015 0838       Impression and Plan: Ms. Donaldson is a 85 year old white female with early-stage 1 ductal carcinoma the left breast. She's doing well. She is 18  years out now. I see no evidence of recurrent disease.  Right now, I just do not see a reason why she has lost the weight.  I do not see any evidence of breast cancer recurrence.  I do not see any evidence of any other issue.  I talked her about nutrition.  I told her to try to increase her intake of complex carbohydrates.  She does like breads.  She does like oatmeal.  I think this will help.  I would like to see her back in 4 months just to follow-up with everything.  Hopefully, her weight will be back up.    Volanda Napoleon, MD 6/25/202010:47 AM

## 2018-12-28 DIAGNOSIS — H35371 Puckering of macula, right eye: Secondary | ICD-10-CM | POA: Diagnosis not present

## 2018-12-28 DIAGNOSIS — H353122 Nonexudative age-related macular degeneration, left eye, intermediate dry stage: Secondary | ICD-10-CM | POA: Diagnosis not present

## 2018-12-28 DIAGNOSIS — H35341 Macular cyst, hole, or pseudohole, right eye: Secondary | ICD-10-CM | POA: Diagnosis not present

## 2018-12-28 DIAGNOSIS — H353211 Exudative age-related macular degeneration, right eye, with active choroidal neovascularization: Secondary | ICD-10-CM | POA: Diagnosis not present

## 2019-01-15 ENCOUNTER — Ambulatory Visit: Payer: Medicare HMO | Admitting: Nurse Practitioner

## 2019-01-24 ENCOUNTER — Other Ambulatory Visit: Payer: Self-pay

## 2019-01-24 ENCOUNTER — Ambulatory Visit: Payer: Medicare HMO

## 2019-01-24 DIAGNOSIS — M810 Age-related osteoporosis without current pathological fracture: Secondary | ICD-10-CM | POA: Diagnosis not present

## 2019-01-24 MED ORDER — DENOSUMAB 60 MG/ML ~~LOC~~ SOSY
60.0000 mg | PREFILLED_SYRINGE | Freq: Once | SUBCUTANEOUS | Status: AC
  Administered 2019-01-24: 60 mg via SUBCUTANEOUS

## 2019-01-25 ENCOUNTER — Other Ambulatory Visit: Payer: Self-pay | Admitting: Nurse Practitioner

## 2019-01-25 DIAGNOSIS — R0989 Other specified symptoms and signs involving the circulatory and respiratory systems: Secondary | ICD-10-CM

## 2019-01-25 DIAGNOSIS — R198 Other specified symptoms and signs involving the digestive system and abdomen: Secondary | ICD-10-CM

## 2019-01-25 DIAGNOSIS — A048 Other specified bacterial intestinal infections: Secondary | ICD-10-CM

## 2019-01-28 ENCOUNTER — Other Ambulatory Visit: Payer: Self-pay | Admitting: Nurse Practitioner

## 2019-01-28 DIAGNOSIS — E782 Mixed hyperlipidemia: Secondary | ICD-10-CM

## 2019-02-08 DIAGNOSIS — H353211 Exudative age-related macular degeneration, right eye, with active choroidal neovascularization: Secondary | ICD-10-CM | POA: Diagnosis not present

## 2019-02-08 DIAGNOSIS — H353122 Nonexudative age-related macular degeneration, left eye, intermediate dry stage: Secondary | ICD-10-CM | POA: Diagnosis not present

## 2019-02-08 DIAGNOSIS — H43812 Vitreous degeneration, left eye: Secondary | ICD-10-CM | POA: Diagnosis not present

## 2019-02-12 ENCOUNTER — Encounter: Payer: Self-pay | Admitting: Nurse Practitioner

## 2019-02-28 DIAGNOSIS — L821 Other seborrheic keratosis: Secondary | ICD-10-CM | POA: Diagnosis not present

## 2019-02-28 DIAGNOSIS — L570 Actinic keratosis: Secondary | ICD-10-CM | POA: Diagnosis not present

## 2019-02-28 DIAGNOSIS — D1801 Hemangioma of skin and subcutaneous tissue: Secondary | ICD-10-CM | POA: Diagnosis not present

## 2019-02-28 DIAGNOSIS — L814 Other melanin hyperpigmentation: Secondary | ICD-10-CM | POA: Diagnosis not present

## 2019-03-23 ENCOUNTER — Encounter: Payer: Self-pay | Admitting: Nurse Practitioner

## 2019-03-23 ENCOUNTER — Ambulatory Visit: Payer: Self-pay

## 2019-03-26 ENCOUNTER — Encounter: Payer: Self-pay | Admitting: Nurse Practitioner

## 2019-03-26 ENCOUNTER — Ambulatory Visit (INDEPENDENT_AMBULATORY_CARE_PROVIDER_SITE_OTHER): Payer: Medicare HMO | Admitting: Nurse Practitioner

## 2019-03-26 ENCOUNTER — Other Ambulatory Visit: Payer: Self-pay

## 2019-03-26 VITALS — BP 118/62 | HR 84 | Temp 98.0°F | Resp 18 | Ht 62.0 in | Wt 96.4 lb

## 2019-03-26 DIAGNOSIS — Z23 Encounter for immunization: Secondary | ICD-10-CM

## 2019-03-26 DIAGNOSIS — M810 Age-related osteoporosis without current pathological fracture: Secondary | ICD-10-CM | POA: Diagnosis not present

## 2019-03-26 DIAGNOSIS — Z0000 Encounter for general adult medical examination without abnormal findings: Secondary | ICD-10-CM

## 2019-03-26 NOTE — Progress Notes (Signed)
Subjective:   Whitney Donaldson is a 85 y.o. female who presents for Medicare Annual (Subsequent) preventive examination.  Review of Systems:   Cardiac Risk Factors include: dyslipidemia     Objective:     Vitals: BP 118/62   Pulse 84   Temp 98 F (36.7 C)   Resp 18   Ht 5\' 2"  (1.575 m)   Wt 96 lb 6.4 oz (43.7 kg)   SpO2 98%   BMI 17.63 kg/m   Body mass index is 17.63 kg/m.  Advanced Directives 12/21/2018 12/12/2018 09/12/2018 06/12/2018 03/20/2018 12/23/2017 06/24/2017  Does Patient Have a Medical Advance Directive? Yes Yes Yes Yes Yes Yes Yes  Type of Paramedic of [place];Living will (No Data) [place];Out of facility DNR (pink MOST or yellow form) Healthcare Power of [place];Out of facility DNR (pink MOST or yellow form) [place];Out of facility DNR (pink MOST or yellow form) Out of facility DNR (pink MOST or yellow form)  Does patient want to make changes to medical advance directive? - No - Patient declined No - Patient declined - No - Patient declined - -  Copy of [place] in Chart? - - Yes - validated most recent copy scanned in chart (See row information) Yes - validated most recent copy scanned in chart (See row information) Yes No - copy requested -  Would patient like information on creating a medical advance directive? - - - - - - -  Pre-existing out of facility DNR order (yellow form or pink MOST form) - - Yellow form placed in chart (order not valid for inpatient use) - Yellow form placed in chart (order not valid for inpatient use) Yellow form placed in chart (order not valid for inpatient use) Yellow form placed in chart (order not valid for inpatient use)    Tobacco Social History   Tobacco Use  Smoking Status Never Smoker  Smokeless Tobacco Never Used  Tobacco Comment   NEVER USED TOBACCO     Counseling given: Not Answered Comment: NEVER USED  TOBACCO   Clinical Intake:  Pre-visit preparation completed: Yes  Pain : No/denies pain     BMI - recorded: 17.63 Nutritional Status: BMI <19  Underweight Nutritional Risks: Unintentional weight loss(lost weight but more recently has gained)  How often do you need to have someone help you when you read instructions, pamphlets, or other written materials from your doctor or pharmacy?: 3 - Sometimes What is the last grade level you completed in school?: 12th grade  Interpreter Needed?: No     Past Medical History:  Diagnosis Date  . Arthritis    Bilateral hands  . Cancer ([place])    breast hx of, skin hx of  . Hyperlipidemia   . Nephrolithiasis     X1  . Osteopenia   . Positive H. pylori test 01/27/2017   Past Surgical History:  Procedure Laterality Date  . COLONOSCOPY  2004    negative; Dr Fuller Plan  . colonoscopy with polypectomy  2014  . mastestomy  2003   partial /left  . SKIN CANCER EXCISION     chest , Dr Ubaldo Glassing   Family History  Problem Relation Age of Onset  . Heart attack Mother 30  . Heart disease Mother   . Lung cancer Father        smoker  . COPD Father   . Cancer Father   . Stroke Sister 39  .  Alzheimer's disease Sister   . Heart attack Brother 38  . Heart disease Brother   . Diabetes Neg Hx    Social History   Socioeconomic History  . Marital status: Married    Spouse name: Not on file  . Number of children: Not on file  . Years of education: Not on file  . Highest education level: Not on file  Occupational History  . Not on file  Social Needs  . Financial resource strain: Not hard at all  . Food insecurity    Worry: Never true    Inability: Never true  . Transportation needs    Medical: No    Non-medical: No  Tobacco Use  . Smoking status: Never Smoker  . Smokeless tobacco: Never Used  . Tobacco comment: NEVER USED TOBACCO  Substance and Sexual Activity  . Alcohol use: No    Alcohol/week: 0.0 standard drinks  . Drug use: No  .  Sexual activity: Not Currently  Lifestyle  . Physical activity    Days per week: 0 days    Minutes per session: 0 min  . Stress: To some extent  Relationships  . Social connections    Talks on phone: More than three times a week    Gets together: More than three times a week    Attends religious service: More than 4 times per year    Active member of club or organization: No    Attends meetings of clubs or organizations: Never    Relationship status: Married  Other Topics Concern  . Not on file  Social History Narrative   Social History      Diet? Healthy diet      Do you drink/eat things with caffeine? Very little      Marital status?            married                        What year were you married? 1957      Do you live in a house, apartment, assisted living, condo, trailer, etc.? house      Is it one or more stories? 2      How many persons live in your home? 2      Do you have any pets in your home? (please list) dog and cat      Highest level of education completed? High school      Current or past profession: Architect      Advanced Directives      Do you exercise?         Very little                             Type & how often? walk      Do you have a living will? yes      Do you have a DNR form?       no                           If not, do you want to discuss one? no      Do you have signed POA/HPOA for forms? yes      Functional Status      Do you have difficulty bathing or dressing yourself? no      Do you have difficulty preparing  food or eating? no      Do you have difficulty managing your medications? no      Do you have difficulty managing your finances? no      Do you have difficulty affording your medications? no    Outpatient Encounter Medications as of 03/26/2019  Medication Sig  . aspirin 81 MG tablet Take 81 mg by mouth daily.    . Calcium Carbonate-Vitamin D (CALCIUM + D PO) Take by mouth daily.   .  Cholecalciferol (VITAMIN D3) 1000 UNITS CAPS Take by mouth 2 (two) times daily.   . Coenzyme Q10 (COQ10 PO) Take 1 tablet by mouth daily.  . cyanocobalamin 500 MCG tablet Take 500 mcg by mouth daily. Take 2 pills daily  . FOLIC ACID PO Take [id number] mg by mouth daily.   . Multiple Vitamin (MULTIVITAMIN) tablet Take 1 tablet by mouth daily.    . Multiple Vitamins-Minerals (VISION-VITE PRESERVE PO) Take by mouth.  . pantoprazole (PROTONIX) 40 MG tablet Take 1 tablet by mouth once daily  . rosuvastatin (CRESTOR) 20 MG tablet TAKE 1 TABLET BY MOUTH AT BEDTIME  . vitamin C (ASCORBIC ACID) 250 MG tablet Take 500 mg by mouth daily.   . Vitamin E 400 UNITS TABS Take by mouth daily.   No facility-administered encounter medications on file as of 03/26/2019.     Activities of Daily Living In your present state of health, do you have any difficulty performing the following activities: 03/26/2019  Hearing? N  Vision? Y  Difficulty concentrating or making decisions? N  Walking or climbing stairs? N  Dressing or bathing? N  Doing errands, shopping? N  Preparing Food and eating ? N  Using the Toilet? N  In the past six months, have you accidently leaked urine? N  Do you have problems with loss of bowel control? N  Managing your Medications? N  Managing your Finances? N  Housekeeping or managing your Housekeeping? N  Some recent data might be hidden    Patient Care Team: Lauree Chandler, NP as PCP - General (Geriatric Medicine) Renette Butters, MD as Consulting Physician (Orthopedic Surgery) Rolm Bookbinder, MD as Consulting Physician (Dermatology) Kathie Rhodes, MD as Consulting Physician (Urology) Marin Olp Rudell Cobb, MD as Consulting Physician (Oncology)    Assessment:   This is a routine wellness examination for [place].  Exercise Activities and Dietary recommendations Current Exercise Habits: The patient does not participate in regular exercise at present  Goals    . Exercise 3x per week  (30 min per time)     Patient will do more outside work and go to the gym using silver sneaker    . patient (pt-stated)     Reduce stress by getting out more       Fall Risk Fall Risk  12/12/2018 09/12/2018 06/12/2018 03/20/2018 12/23/2017  Falls in the past year? 0 1 1 Yes No  Comment - - - - -  Number falls in past yr: - 0 0 1 -  Comment - - Slipped on pine needles  - -  Injury with Fall? - 1 0 Yes -  Comment - - - tripped, laceration on face -   Is the patient's home free of loose throw rugs in walkways, pet beds, electrical cords, etc?   no      Grab bars in the bathroom? no      Handrails on the stairs?   yes      Adequate lighting?   yes  Timed Get Up and Go performed: na  Depression Screen PHQ 2/9 Scores 12/12/2018 03/20/2018 03/14/2017 01/25/2017  PHQ - 2 Score 0 1 0 0     Cognitive Function MMSE - Mini Mental State Exam 03/26/2019 03/20/2018 03/14/2017  Not completed: (No Data) - -  Orientation to time 5 5 5   Orientation to Place 5 5 5   Registration 3 3 3   Attention/ Calculation 5 5 5   Recall 2 3 3   Language- name 2 objects 2 2 2   Language- repeat 1 1 1   Language- follow 3 step command 3 3 3   Language- read & follow direction 1 1 1   Write a sentence 1 1 1   Copy design 1 1 1   Total score 29 30 30         Immunization History  Administered Date(s) Administered  . Influenza Split 04/07/2011  . Influenza Whole 06/01/2007, 04/04/2008, 04/10/2009  . Influenza, High Dose Seasonal PF 04/19/2014, 03/27/2015, 04/14/2016, 03/14/2017, 03/20/2018  . Pneumococcal Conjugate-13 12/05/2014  . Pneumococcal Polysaccharide-23 07/30/2011  . Tdap 08/22/2012  . Zoster 04/15/2011  . Zoster Recombinat (Shingrix) 12/23/2017, 02/26/2018, 04/25/2018    Qualifies for Shingles Vaccine?done  Screening Tests Health Maintenance  Topic Date Due  . INFLUENZA VACCINE  01/27/2019  . MAMMOGRAM  06/14/2019  . TETANUS/TDAP  08/22/2022  . DEXA SCAN  Completed  . PNA vac Low Risk Adult   Completed    Cancer Screenings: Lung: Low Dose CT Chest recommended if Age 53-80 years, 30 pack-year currently smoking OR have quit w/in 15years. Patient does not qualify. Breast:  Up to date on Mammogram? Yes   Up to date of Bone Density/Dexa? Yes Colorectal: aged out  Additional Screenings: na Hepatitis C Screening:      Plan:      I have personally reviewed and noted the following in the patient's chart:   . Medical and social history . Use of alcohol, tobacco or illicit drugs  . Current medications and supplements . Functional ability and status . Nutritional status . Physical activity . Advanced directives . List of other physicians . Hospitalizations, surgeries, and ER visits in previous 12 months . Vitals . Screenings to include cognitive, depression, and falls . Referrals and appointments  In addition, I have reviewed and discussed with patient certain preventive protocols, quality metrics, and best practice recommendations. A written personalized care plan for preventive services as well as general preventive health recommendations were provided to patient.     Lauree Chandler, NP  03/26/2019

## 2019-03-26 NOTE — Patient Instructions (Signed)
Ms. Linck , Thank you for taking time to come for your Medicare Wellness Visit. I appreciate your ongoing commitment to your health goals. Please review the following plan we discussed and let me know if I can assist you in the future.   Screening recommendations/referrals: Colonoscopy aged out Mammogram up to date Bone Density DUE Recommended yearly ophthalmology/optometry visit for glaucoma screening and checkup Recommended yearly dental visit for hygiene and checkup  Vaccinations: Influenza vaccine- received today Pneumococcal vaccine up to date Tdap vaccine up to date Shingles vaccine up to date    Advanced directives: on file.   Conditions/risks identified: weight loss. To continue supplements  Next appointment: 1 year    Preventive Care 83 Years and Older, Female Preventive care refers to lifestyle choices and visits with your health care provider that can promote health and wellness. What does preventive care include?  A yearly physical exam. This is also called an annual well check.  Dental exams once or twice a year.  Routine eye exams. Ask your health care provider how often you should have your eyes checked.  Personal lifestyle choices, including:  Daily care of your teeth and gums.  Regular physical activity.  Eating a healthy diet.  Avoiding tobacco and drug use.  Limiting alcohol use.  Practicing safe sex.  Taking low-dose aspirin every day.  Taking vitamin and mineral supplements as recommended by your health care provider. What happens during an annual well check? The services and screenings done by your health care provider during your annual well check will depend on your age, overall health, lifestyle risk factors, and family history of disease. Counseling  Your health care provider may ask you questions about your:  Alcohol use.  Tobacco use.  Drug use.  Emotional well-being.  Home and relationship well-being.  Sexual activity.   Eating habits.  History of falls.  Memory and ability to understand (cognition).  Work and work Statistician.  Reproductive health. Screening  You may have the following tests or measurements:  Height, weight, and BMI.  Blood pressure.  Lipid and cholesterol levels. These may be checked every 5 years, or more frequently if you are over 63 years old.  Skin check.  Lung cancer screening. You may have this screening every year starting at age 83 if you have a 30-pack-year history of smoking and currently smoke or have quit within the past 15 years.  Fecal occult blood test (FOBT) of the stool. You may have this test every year starting at age 65.  Flexible sigmoidoscopy or colonoscopy. You may have a sigmoidoscopy every 5 years or a colonoscopy every 10 years starting at age 83.  Hepatitis C blood test.  Hepatitis B blood test.  Sexually transmitted disease (STD) testing.  Diabetes screening. This is done by checking your blood sugar (glucose) after you have not eaten for a while (fasting). You may have this done every 1-3 years.  Bone density scan. This is done to screen for osteoporosis. You may have this done starting at age 34.  Mammogram. This may be done every 1-2 years. Talk to your health care provider about how often you should have regular mammograms. Talk with your health care provider about your test results, treatment options, and if necessary, the need for more tests. Vaccines  Your health care provider may recommend certain vaccines, such as:  Influenza vaccine. This is recommended every year.  Tetanus, diphtheria, and acellular pertussis (Tdap, Td) vaccine. You may need a Td booster every 10  years.  Zoster vaccine. You may need this after age 74.  Pneumococcal 13-valent conjugate (PCV13) vaccine. One dose is recommended after age 26.  Pneumococcal polysaccharide (PPSV23) vaccine. One dose is recommended after age 69. Talk to your health care provider  about which screenings and vaccines you need and how often you need them. This information is not intended to replace advice given to you by your health care provider. Make sure you discuss any questions you have with your health care provider. Document Released: 07/11/2015 Document Revised: 03/03/2016 Document Reviewed: 04/15/2015 Elsevier Interactive Patient Education  2017 [place] Prevention in the Home Falls can cause injuries. They can happen to people of all ages. There are many things you can do to make your home safe and to help prevent falls. What can I do on the outside of my home?  Regularly fix the edges of walkways and driveways and fix any cracks.  Remove anything that might make you trip as you walk through a door, such as a raised step or threshold.  Trim any bushes or trees on the path to your home.  Use bright outdoor lighting.  Clear any walking paths of anything that might make someone trip, such as rocks or tools.  Regularly check to see if handrails are loose or broken. Make sure that both sides of any steps have handrails.  Any raised decks and porches should have guardrails on the edges.  Have any leaves, snow, or ice cleared regularly.  Use sand or salt on walking paths during winter.  Clean up any spills in your garage right away. This includes oil or grease spills. What can I do in the bathroom?  Use night lights.  Install grab bars by the toilet and in the tub and shower. Do not use towel bars as grab bars.  Use non-skid mats or decals in the tub or shower.  If you need to sit down in the shower, use a plastic, non-slip stool.  Keep the floor dry. Clean up any water that spills on the floor as soon as it happens.  Remove soap buildup in the tub or shower regularly.  Attach bath mats securely with double-sided non-slip rug tape.  Do not have throw rugs and other things on the floor that can make you trip. What can I do in the  bedroom?  Use night lights.  Make sure that you have a light by your bed that is easy to reach.  Do not use any sheets or blankets that are too big for your bed. They should not hang down onto the floor.  Have a firm chair that has side arms. You can use this for support while you get dressed.  Do not have throw rugs and other things on the floor that can make you trip. What can I do in the kitchen?  Clean up any spills right away.  Avoid walking on wet floors.  Keep items that you use a lot in easy-to-reach places.  If you need to reach something above you, use a strong step stool that has a grab bar.  Keep electrical cords out of the way.  Do not use floor polish or wax that makes floors slippery. If you must use wax, use non-skid floor wax.  Do not have throw rugs and other things on the floor that can make you trip. What can I do with my stairs?  Do not leave any items on the stairs.  Make sure that  there are handrails on both sides of the stairs and use them. Fix handrails that are broken or loose. Make sure that handrails are as long as the stairways.  Check any carpeting to make sure that it is firmly attached to the stairs. Fix any carpet that is loose or worn.  Avoid having throw rugs at the top or bottom of the stairs. If you do have throw rugs, attach them to the floor with carpet tape.  Make sure that you have a light switch at the top of the stairs and the bottom of the stairs. If you do not have them, ask someone to add them for you. What else can I do to help prevent falls?  Wear shoes that:  Do not have high heels.  Have rubber bottoms.  Are comfortable and fit you well.  Are closed at the toe. Do not wear sandals.  If you use a stepladder:  Make sure that it is fully opened. Do not climb a closed stepladder.  Make sure that both sides of the stepladder are locked into place.  Ask someone to hold it for you, if possible.  Clearly mark and make  sure that you can see:  Any grab bars or handrails.  First and last steps.  Where the edge of each step is.  Use tools that help you move around (mobility aids) if they are needed. These include:  Canes.  Walkers.  Scooters.  Crutches.  Turn on the lights when you go into a dark area. Replace any light bulbs as soon as they burn out.  Set up your furniture so you have a clear path. Avoid moving your furniture around.  If any of your floors are uneven, fix them.  If there are any pets around you, be aware of where they are.  Review your medicines with your doctor. Some medicines can make you feel dizzy. This can increase your chance of falling. Ask your doctor what other things that you can do to help prevent falls. This information is not intended to replace advice given to you by your health care provider. Make sure you discuss any questions you have with your health care provider. Document Released: 04/10/2009 Document Revised: 11/20/2015 Document Reviewed: 07/19/2014 Elsevier Interactive Patient Education  2017 [employer].

## 2019-03-29 DIAGNOSIS — H353122 Nonexudative age-related macular degeneration, left eye, intermediate dry stage: Secondary | ICD-10-CM | POA: Diagnosis not present

## 2019-03-29 DIAGNOSIS — H353211 Exudative age-related macular degeneration, right eye, with active choroidal neovascularization: Secondary | ICD-10-CM | POA: Diagnosis not present

## 2019-03-29 DIAGNOSIS — H43813 Vitreous degeneration, bilateral: Secondary | ICD-10-CM | POA: Diagnosis not present

## 2019-03-29 DIAGNOSIS — H35371 Puckering of macula, right eye: Secondary | ICD-10-CM | POA: Diagnosis not present

## 2019-04-24 ENCOUNTER — Telehealth: Payer: Self-pay | Admitting: Hematology & Oncology

## 2019-04-24 ENCOUNTER — Inpatient Hospital Stay: Payer: Medicare HMO | Attending: Hematology & Oncology | Admitting: Hematology & Oncology

## 2019-04-24 ENCOUNTER — Encounter: Payer: Self-pay | Admitting: Hematology & Oncology

## 2019-04-24 ENCOUNTER — Other Ambulatory Visit: Payer: Self-pay

## 2019-04-24 ENCOUNTER — Inpatient Hospital Stay: Payer: Medicare HMO

## 2019-04-24 ENCOUNTER — Ambulatory Visit (HOSPITAL_BASED_OUTPATIENT_CLINIC_OR_DEPARTMENT_OTHER)
Admission: RE | Admit: 2019-04-24 | Discharge: 2019-04-24 | Disposition: A | Discharge status: Home / Self Care | Payer: Medicare HMO | Source: Ambulatory Visit | Attending: Hematology & Oncology | Admitting: Hematology & Oncology

## 2019-04-24 VITALS — BP 134/46 | HR 84 | Temp 97.7°F | Resp 20 | Wt 96.0 lb

## 2019-04-24 DIAGNOSIS — Z79899 Other long term (current) drug therapy: Secondary | ICD-10-CM | POA: Insufficient documentation

## 2019-04-24 DIAGNOSIS — R05 Cough: Secondary | ICD-10-CM | POA: Insufficient documentation

## 2019-04-24 DIAGNOSIS — Z7982 Long term (current) use of aspirin: Secondary | ICD-10-CM | POA: Diagnosis not present

## 2019-04-24 DIAGNOSIS — R058 Other specified cough: Secondary | ICD-10-CM

## 2019-04-24 DIAGNOSIS — Z853 Personal history of malignant neoplasm of breast: Secondary | ICD-10-CM | POA: Insufficient documentation

## 2019-04-24 DIAGNOSIS — R634 Abnormal weight loss: Secondary | ICD-10-CM

## 2019-04-24 LAB — CBC WITH DIFFERENTIAL (CANCER CENTER ONLY)
Abs Immature Granulocytes: 0.01 10*3/uL (ref 0.00–0.07)
Basophils Absolute: 0 10*3/uL (ref 0.0–0.1)
Basophils Relative: 1 %
Eosinophils Absolute: 0.1 10*3/uL (ref 0.0–0.5)
Eosinophils Relative: 2 %
HCT: 43.2 % (ref 36.0–46.0)
Hemoglobin: 14.1 g/dL (ref 12.0–15.0)
Immature Granulocytes: 0 %
Lymphocytes Relative: 13 %
Lymphs Abs: 0.8 10*3/uL (ref 0.7–4.0)
MCH: 32.2 pg (ref 26.0–34.0)
MCHC: 32.6 g/dL (ref 30.0–36.0)
MCV: 98.6 fL (ref 80.0–100.0)
Monocytes Absolute: 0.7 10*3/uL (ref 0.1–1.0)
Monocytes Relative: 11 %
Neutro Abs: 4.3 10*3/uL (ref 1.7–7.7)
Neutrophils Relative %: 73 %
Platelet Count: 165 10*3/uL (ref 150–400)
RBC: 4.38 MIL/uL (ref 3.87–5.11)
RDW: 11.7 % (ref 11.5–15.5)
WBC Count: 5.9 10*3/uL (ref 4.0–10.5)
nRBC: 0 % (ref 0.0–0.2)

## 2019-04-24 LAB — TSH: TSH: 1.982 u[IU]/mL (ref 0.308–3.960)

## 2019-04-24 LAB — CMP (CANCER CENTER ONLY)
ALT: 18 U/L (ref 0–44)
AST: 20 U/L (ref 15–41)
Albumin: 4.8 g/dL (ref 3.5–5.0)
Alkaline Phosphatase: 52 U/L (ref 38–126)
Anion gap: 7 (ref 5–15)
BUN: 24 mg/dL — ABNORMAL HIGH (ref 8–23)
CO2: 32 mmol/L (ref 22–32)
Calcium: 9.7 mg/dL (ref 8.9–10.3)
Chloride: 101 mmol/L (ref 98–111)
Creatinine: 0.81 mg/dL (ref 0.44–1.00)
GFR, Est AFR Am: >60 mL/min (ref >60)
GFR, Estimated: >60 mL/min (ref >60)
Glucose, Bld: 109 mg/dL — ABNORMAL HIGH (ref 70–99)
Potassium: 4.1 mmol/L (ref 3.5–5.1)
Sodium: 140 mmol/L (ref 135–145)
Total Bilirubin: 0.5 mg/dL (ref 0.3–1.2)
Total Protein: 7.3 g/dL (ref 6.5–8.1)

## 2019-04-24 NOTE — Progress Notes (Signed)
Hematology and Oncology Follow Up Visit  Whitney Donaldson [id number] Feb 15, 1933 85 y.o. 04/24/2019   Principle Diagnosis:  Stage I (T1 N0 M0) ductal carcinoma of the left breast.  Current Therapy:    Observation     Interim History:  [name] is back for followup.  Thankfully, she is gaining weight.  She has gained 5 pounds since we last saw her.  She seems to be eating better.  Her problem now is that she has a dry cough.  I am not sure why this is the case.  We will have to get a chest x-ray on her.  I would highly doubt that this is anything related to her remote history of breast cancer.  She just looks a little bit better.  She is more active.  She is eating better.  She has not have any problems going to the bathroom.  There is no nausea or vomiting.  Overall, her performance status is ECOG 2.  Medications:  Current Outpatient Medications:  .  aspirin 81 MG tablet, Take 81 mg by mouth daily.  , Disp: , Rfl:  .  Calcium Carbonate-Vitamin D (CALCIUM + D PO), Take by mouth daily. , Disp: , Rfl:  .  Cholecalciferol (VITAMIN D3) 1000 UNITS CAPS, Take by mouth 2 (two) times daily. , Disp: , Rfl:  .  Coenzyme Q10 (COQ10 PO), Take 1 tablet by mouth daily., Disp: , Rfl:  .  cyanocobalamin 500 MCG tablet, Take 500 mcg by mouth daily. Take 2 pills daily, Disp: , Rfl:  .  FOLIC ACID PO, Take [id number] mg by mouth daily. , Disp: , Rfl:  .  Multiple Vitamin (MULTIVITAMIN) tablet, Take 1 tablet by mouth daily.  , Disp: , Rfl:  .  Multiple Vitamins-Minerals (VISION-VITE PRESERVE PO), Take by mouth., Disp: , Rfl:  .  pantoprazole (PROTONIX) 40 MG tablet, Take 1 tablet by mouth once daily, Disp: 30 tablet, Rfl: 5 .  rosuvastatin (CRESTOR) 20 MG tablet, TAKE 1 TABLET BY MOUTH AT BEDTIME, Disp: 90 tablet, Rfl: 0 .  vitamin C (ASCORBIC ACID) 250 MG tablet, Take 500 mg by mouth daily. , Disp: , Rfl:  .  Vitamin E 400 UNITS TABS, Take by mouth daily., Disp: , Rfl:   Allergies:  Allergies   Allergen Reactions  . Fosamax [Alendronate Sodium]     Gastritis  . Oxycodone-Acetaminophen     REACTION: nausea  . Tramadol     Nausea     Past Medical History, Surgical history, Social history, and Family History were reviewed and updated.  Review of Systems: Review of Systems  Constitutional: Negative.   HENT: Negative.   Eyes: Negative.   Respiratory: Negative.   Cardiovascular: Negative.   Gastrointestinal: Negative.   Genitourinary: Negative.   Musculoskeletal: Negative.   Skin: Negative.   Neurological: Negative.   Endo/Heme/Allergies: Negative.   Psychiatric/Behavioral: Negative.      Physical Exam:  weight is 96 lb (43.5 kg). Her temporal temperature is 97.7 F (36.5 C). Her blood pressure is 134/46 (abnormal) and her pulse is 84. Her respiration is 20 and oxygen saturation is 99%.   Physical Exam Vitals signs reviewed.  Constitutional:      Comments: Her breast exam shows her right breast with no masses, edema or erythema.  There is no right axillary adenopathy.  Left breast shows well-healed lumpectomy at about the 4 o'clock position.  There is some slight firmness at the lumpectomy site.  No distinct masses noted  in the left breast.  There is no left axillary adenopathy.  HENT:     Head: Normocephalic and atraumatic.  Eyes:     Pupils: Pupils are equal, round, and reactive to light.  Neck:     Musculoskeletal: Normal range of motion.  Cardiovascular:     Rate and Rhythm: Normal rate and regular rhythm.     Heart sounds: Normal heart sounds.  Pulmonary:     Effort: Pulmonary effort is normal.     Breath sounds: Normal breath sounds.  Abdominal:     General: Bowel sounds are normal.     Palpations: Abdomen is soft.  Musculoskeletal: Normal range of motion.        General: No tenderness or deformity.  Lymphadenopathy:     Cervical: No cervical adenopathy.  Skin:    General: Skin is warm and dry.     Findings: No erythema or rash.  Neurological:      Mental Status: She is alert and oriented to person, place, and time.  Psychiatric:        Behavior: Behavior normal.        Thought Content: Thought content normal.        Judgment: Judgment normal.      Lab Results  Component Value Date   WBC 5.9 04/24/2019   HGB 14.1 04/24/2019   HCT 43.2 04/24/2019   MCV 98.6 04/24/2019   PLT 165 04/24/2019     Chemistry      Component Value Date/Time   NA 140 04/24/2019 0938   NA 142 12/17/2016 0819   NA 142 12/18/2015 0838   K 4.1 04/24/2019 0938   K 4.3 12/17/2016 0819   K 4.0 12/18/2015 0838   CL 101 04/24/2019 0938   CL 103 12/17/2016 0819   CO2 32 04/24/2019 0938   CO2 29 12/17/2016 0819   CO2 29 12/18/2015 0838   BUN 24 (H) 04/24/2019 0938   BUN 14 12/17/2016 0819   BUN 20.9 12/18/2015 0838   CREATININE 0.81 04/24/2019 0938   CREATININE 0.67 09/12/2018 1539   CREATININE 0.8 12/18/2015 0838      Component Value Date/Time   CALCIUM 9.7 04/24/2019 0938   CALCIUM 9.6 12/17/2016 0819   CALCIUM 9.3 12/18/2015 0838   ALKPHOS 52 04/24/2019 0938   ALKPHOS 116 (H) 12/17/2016 0819   ALKPHOS 56 12/18/2015 0838   AST 20 04/24/2019 0938   AST 18 12/18/2015 0838   ALT 18 04/24/2019 0938   ALT 30 12/17/2016 0819   ALT 22 12/18/2015 0838   BILITOT 0.5 04/24/2019 0938   BILITOT 0.60 12/18/2015 0838       Impression and Plan: Whitney Donaldson is a 85 year old white female with early-stage 1 ductal carcinoma the left breast. She's doing well. She is 18  years out now. I see no evidence of recurrent disease.  We will see what the chest x-ray shows.  I think we can probably get her back now in 1 year.  I know that she is gaining weight.  She looks better.  I feel more confident having her come back in a year.  If there is anything on her chest x-ray, we will certainly get her back sooner and do additional studies.      Volanda Napoleon, MD 10/27/202010:35 AM

## 2019-04-24 NOTE — Telephone Encounter (Signed)
Appointments scheduled calendar printed per 10/27 los 

## 2019-04-25 ENCOUNTER — Telehealth: Payer: Self-pay | Admitting: *Deleted

## 2019-04-25 ENCOUNTER — Other Ambulatory Visit: Payer: Self-pay | Admitting: Nurse Practitioner

## 2019-04-25 DIAGNOSIS — E782 Mixed hyperlipidemia: Secondary | ICD-10-CM

## 2019-04-25 NOTE — Telephone Encounter (Signed)
Notified pt of results. No concerns at this time. 

## 2019-04-25 NOTE — Telephone Encounter (Signed)
-----   Message from Volanda Napoleon, MD sent at 04/25/2019  6:31 AM EDT ----- Call - the CXR is normal!!!  Laurey Arrow

## 2019-05-14 ENCOUNTER — Ambulatory Visit (INDEPENDENT_AMBULATORY_CARE_PROVIDER_SITE_OTHER): Payer: Medicare HMO | Admitting: Nurse Practitioner

## 2019-05-14 ENCOUNTER — Encounter: Payer: Self-pay | Admitting: Nurse Practitioner

## 2019-05-14 ENCOUNTER — Other Ambulatory Visit: Payer: Self-pay

## 2019-05-14 VITALS — BP 110/66 | HR 81 | Temp 96.9°F | Ht 62.0 in | Wt 97.2 lb

## 2019-05-14 DIAGNOSIS — F419 Anxiety disorder, unspecified: Secondary | ICD-10-CM | POA: Diagnosis not present

## 2019-05-14 DIAGNOSIS — R198 Other specified symptoms and signs involving the digestive system and abdomen: Secondary | ICD-10-CM

## 2019-05-14 DIAGNOSIS — R0989 Other specified symptoms and signs involving the circulatory and respiratory systems: Secondary | ICD-10-CM

## 2019-05-14 DIAGNOSIS — R69 Illness, unspecified: Secondary | ICD-10-CM | POA: Diagnosis not present

## 2019-05-14 MED ORDER — SERTRALINE HCL 25 MG PO TABS: 25.0000 mg | ORAL_TABLET | Freq: Every day | ORAL | 1 refills | Status: DC

## 2019-05-14 NOTE — Progress Notes (Signed)
Careteam: Patient Care Team: Lauree Chandler, NP as PCP - General (Geriatric Medicine) Renette Butters, MD as Consulting Physician (Orthopedic Surgery) Rolm Bookbinder, MD as Consulting Physician (Dermatology) Kathie Rhodes, MD as Consulting Physician (Urology) Marin Olp Rudell Cobb, MD as Consulting Physician (Oncology)  Advanced Directive information    Allergies  Allergen Reactions  . Fosamax [Alendronate Sodium]     Gastritis  . Oxycodone-Acetaminophen     REACTION: nausea  . Tramadol     Nausea     Chief Complaint  Patient presents with  . Acute Visit    Difficulity swallowing off/on. Patient had an episode after eating where she had pain in center of chest and burping.      HPI: Patient is a 85 y.o. female seen in the office today due to having trouble swallowing. Attempts to take small bites but occasional feels like food gets stuck and she feels like she has to throw it up. She eventually will get it up and the pieces will not be big.  Does not feel like she is getting choke but feels like it will not pass through. Feels like she can not swallow it and has to drink a lot of water to get it through. Had this episode a few days last week but prior to that it had been a while. Also had an episode of indigestion.  Has a chronic cough but does not feel like this is the same cough as when she is getting sick. Complained about this to oncologist who did chest xray which was unremarkable.    Feeling very upset about the way to world is. Her niece tells her she needs something "for her nerves"  Has a lot of anxiety but does not feel depressed.  Goes outside to get her mind off things and that helps.  Worries herself to death over things that she can not control. Does not like to be out.  Feeling fatigued all the time.  Weight was previously 102 but then went down to 91, now up to 97. Reports Good appetite.    Review of Systems:  Review of Systems  Constitutional:  Negative for chills and fever.  Respiratory: Positive for cough (dry). Negative for shortness of breath.   Cardiovascular: Negative for chest pain and leg swelling.  Gastrointestinal: Positive for heartburn. Negative for abdominal pain, constipation, diarrhea, nausea and vomiting.  Psychiatric/Behavioral: Negative for depression and memory loss. The patient is nervous/anxious. The patient does not have insomnia.     Past Medical History:  Diagnosis Date  . Arthritis    Bilateral hands  . Cancer ([place])    breast hx of, skin hx of  . Hyperlipidemia   . Nephrolithiasis     X1  . Osteopenia   . Positive H. pylori test 01/27/2017   Past Surgical History:  Procedure Laterality Date  . COLONOSCOPY  2004    negative; Dr Fuller Plan  . colonoscopy with polypectomy  2014  . mastestomy  2003   partial /left  . SKIN CANCER EXCISION     chest , Dr Ubaldo Glassing   Social History:   reports that she has never smoked. She has never used smokeless tobacco. She reports that she does not drink alcohol or use drugs.  Family History  Problem Relation Age of Onset  . Heart attack Mother 55  . Heart disease Mother   . Lung cancer Father        smoker  . COPD Father   .  Cancer Father   . Stroke Sister 26  . Alzheimer's disease Sister   . Heart attack Brother 92  . Heart disease Brother   . Diabetes Neg Hx     Medications: Patient's Medications  New Prescriptions   No medications on file  Previous Medications   ASPIRIN 81 MG TABLET    Take 81 mg by mouth daily.     CALCIUM CARBONATE-VITAMIN D (CALCIUM + D PO)    Take by mouth daily.    CHOLECALCIFEROL (VITAMIN D3) 1000 UNITS CAPS    Take by mouth 2 (two) times daily.    COENZYME Q10 (COQ10 PO)    Take 1 tablet by mouth daily.   FOLIC ACID (FOLATE PO)    Take 1 tablet by mouth daily.   MULTIPLE VITAMIN (MULTIVITAMIN) TABLET    Take 1 tablet by mouth daily.     MULTIPLE VITAMINS-MINERALS (VISION-VITE PRESERVE PO)    Take by mouth.   PANTOPRAZOLE  (PROTONIX) 40 MG TABLET    Take 1 tablet by mouth once daily   ROSUVASTATIN (CRESTOR) 20 MG TABLET    TAKE 1 TABLET BY MOUTH AT BEDTIME   VITAMIN B-12 (CYANOCOBALAMIN) 1000 MCG TABLET    Take 1,000 mcg by mouth daily.   VITAMIN C (ASCORBIC ACID) 250 MG TABLET    Take 500 mg by mouth daily.    VITAMIN E 400 UNITS TABS    Take by mouth daily.  Modified Medications   No medications on file  Discontinued Medications   CYANOCOBALAMIN 500 MCG TABLET    Take 500 mcg by mouth daily. Take 2 pills daily   FOLIC ACID PO    Take A999333 mg by mouth daily.     Physical Exam:  Vitals:   05/14/19 1139  BP: 110/66  Pulse: 81  Temp: (!) 96.9 F (36.1 C)  TempSrc: Temporal  SpO2: 98%  Weight: 97 lb 3.2 oz (44.1 kg)  Height: 5\' 2"  (1.575 m)   Body mass index is 17.78 kg/m. Wt Readings from Last 3 Encounters:  05/14/19 97 lb 3.2 oz (44.1 kg)  04/24/19 96 lb (43.5 kg)  03/26/19 96 lb 6.4 oz (43.7 kg)    Physical Exam Constitutional:      General: She is not in acute distress.    Appearance: She is well-developed. She is not diaphoretic.     Comments: Thin female  HENT:     Head: Normocephalic and atraumatic.     Mouth/Throat:     Pharynx: No oropharyngeal exudate.  Eyes:     Conjunctiva/sclera: Conjunctivae normal.     Pupils: Pupils are equal, round, and reactive to light.  Neck:     Musculoskeletal: Normal range of motion and neck supple.  Cardiovascular:     Rate and Rhythm: Normal rate and regular rhythm.     Heart sounds: Normal heart sounds.  Pulmonary:     Effort: Pulmonary effort is normal.     Breath sounds: Normal breath sounds.  Abdominal:     General: Bowel sounds are normal.     Palpations: Abdomen is soft.  Musculoskeletal:        General: No tenderness.  Skin:    General: Skin is warm and dry.  Neurological:     Mental Status: She is alert and oriented to person, place, and time.     Labs reviewed: Basic Metabolic Panel: Recent Labs    09/12/18 1539 12/21/18  0908 04/24/19 0938  NA 142 140 140  K  4.5 4.3 4.1  CL 107 102 101  CO2 29 32 32  GLUCOSE 126 108* 109*  BUN 20 24* 24*  CREATININE 0.67 0.72 0.81  CALCIUM 8.7 10.0 9.7  TSH  --   --  1.982   Liver Function Tests: Recent Labs    09/12/18 1539 12/21/18 0908 04/24/19 0938  AST 21 22 20   ALT 18 21 18   ALKPHOS  --  49 52  BILITOT 0.4 0.6 0.5  PROT 6.0* 6.7 7.3  ALBUMIN  --  4.6 4.8   No results for input(s): LIPASE, AMYLASE in the last 8760 hours. No results for input(s): AMMONIA in the last 8760 hours. CBC: Recent Labs    10/16/18 1033 12/21/18 0908 04/24/19 0938  WBC 4.3 6.2 5.9  NEUTROABS 2,829 4.4 4.3  HGB 13.9 14.6 14.1  HCT 41.5 45.1 43.2  MCV 94.7 98.3 98.6  PLT 154 155 165   Lipid Panel: Recent Labs    06/19/18 1003  CHOL 200*  HDL 77  LDLCALC 108*  TRIG 64  CHOLHDL 2.6   TSH: Recent Labs    04/24/19 0938  TSH 1.982   A1C: Lab Results  Component Value Date   HGBA1C 6.2 11/29/2013     Assessment/Plan 1. Anxiety -lots of stressors due to world events, husband with dementia, etc Feeling anxious most days and reports increase fatigue. States getting outside has helped her significantly but worried that with the cool temperatures coming and not being able to get outside as much this will make her mood worse. Agreeable to start medication.  -encouraged to still try to get outside daily and increase physical activity if only for 10 mins.  - sertraline (ZOLOFT) 25 MG tablet; Take 1 tablet (25 mg total) by mouth daily.  Dispense: 30 tablet; Refill: 1  2. Globus sensation -more episodes of food getting stuck going down and having to regurgitate. Having episodes of indigestion on protonix 40 mg daily  - Ambulatory referral to Gastroenterology for further evaluation. Also had a significant decline in weight. She is gaining at this time but question if GI issue contributing despite her report of good appetite (mood could also be contributing).   Next  appt: 4 weeks for mood and bmp [place] Smola K. [place], [facility] [phone]

## 2019-05-14 NOTE — Patient Instructions (Signed)
To start zoloft 25 mg by mouth daily- can start at night but if you feel like this is keeping you up take with your morning medications.  Continue to try to get outdoors at least once daily  Take some time for physical activity, even 10 mins every day will helps  Do a gratitude journal, 5 mins every night.   We will refer you to gastroenterologist for this sensation of food getting stuck.   Follow up in 1 month for your mood.

## 2019-05-16 ENCOUNTER — Encounter: Payer: Self-pay | Admitting: Gastroenterology

## 2019-05-17 DIAGNOSIS — H353211 Exudative age-related macular degeneration, right eye, with active choroidal neovascularization: Secondary | ICD-10-CM | POA: Diagnosis not present

## 2019-05-17 DIAGNOSIS — H353122 Nonexudative age-related macular degeneration, left eye, intermediate dry stage: Secondary | ICD-10-CM | POA: Diagnosis not present

## 2019-05-31 DIAGNOSIS — H5212 Myopia, left eye: Secondary | ICD-10-CM | POA: Diagnosis not present

## 2019-05-31 DIAGNOSIS — Z961 Presence of intraocular lens: Secondary | ICD-10-CM | POA: Diagnosis not present

## 2019-05-31 DIAGNOSIS — H52223 Regular astigmatism, bilateral: Secondary | ICD-10-CM | POA: Diagnosis not present

## 2019-05-31 DIAGNOSIS — H524 Presbyopia: Secondary | ICD-10-CM | POA: Diagnosis not present

## 2019-05-31 DIAGNOSIS — H5201 Hypermetropia, right eye: Secondary | ICD-10-CM | POA: Diagnosis not present

## 2019-06-13 ENCOUNTER — Ambulatory Visit: Payer: Medicare HMO | Admitting: Nurse Practitioner

## 2019-06-15 ENCOUNTER — Encounter: Payer: Self-pay | Admitting: Nurse Practitioner

## 2019-06-15 DIAGNOSIS — Z1231 Encounter for screening mammogram for malignant neoplasm of breast: Secondary | ICD-10-CM | POA: Diagnosis not present

## 2019-06-15 DIAGNOSIS — Z853 Personal history of malignant neoplasm of breast: Secondary | ICD-10-CM | POA: Diagnosis not present

## 2019-06-18 LAB — HM MAMMOGRAPHY

## 2019-06-25 ENCOUNTER — Ambulatory Visit: Payer: Medicare HMO | Admitting: Nurse Practitioner

## 2019-06-25 ENCOUNTER — Other Ambulatory Visit: Payer: Self-pay

## 2019-06-25 ENCOUNTER — Encounter: Payer: Self-pay | Admitting: Nurse Practitioner

## 2019-06-25 ENCOUNTER — Ambulatory Visit (INDEPENDENT_AMBULATORY_CARE_PROVIDER_SITE_OTHER): Payer: Medicare HMO | Admitting: Nurse Practitioner

## 2019-06-25 DIAGNOSIS — R634 Abnormal weight loss: Secondary | ICD-10-CM

## 2019-06-25 DIAGNOSIS — R69 Illness, unspecified: Secondary | ICD-10-CM | POA: Diagnosis not present

## 2019-06-25 DIAGNOSIS — Z66 Do not resuscitate: Secondary | ICD-10-CM

## 2019-06-25 DIAGNOSIS — E7801 Familial hypercholesterolemia: Secondary | ICD-10-CM

## 2019-06-25 DIAGNOSIS — R0989 Other specified symptoms and signs involving the circulatory and respiratory systems: Secondary | ICD-10-CM

## 2019-06-25 DIAGNOSIS — M810 Age-related osteoporosis without current pathological fracture: Secondary | ICD-10-CM | POA: Diagnosis not present

## 2019-06-25 DIAGNOSIS — F419 Anxiety disorder, unspecified: Secondary | ICD-10-CM

## 2019-06-25 DIAGNOSIS — R198 Other specified symptoms and signs involving the digestive system and abdomen: Secondary | ICD-10-CM

## 2019-06-25 NOTE — Progress Notes (Signed)
This service is provided via telemedicine  No vital signs collected/recorded due to the encounter was a telemedicine visit.   Location of patient (ex: home, work): Home  Patient consents to a telephone visit:  Yes  Location of the provider (ex: office, home):  [facility], Office   Name of any referring provider:  N/A  Names of all persons participating in the telemedicine service and their role in the encounter:  S.Chrae B/CMA, [name], NP, and Patient   Time spent on call: 8 min with medical assistant      Careteam: Patient Care Team: Lauree Chandler, NP as PCP - General (Geriatric Medicine) Renette Butters, MD as Consulting Physician (Orthopedic Surgery) Rolm Bookbinder, MD as Consulting Physician (Dermatology) Kathie Rhodes, MD as Consulting Physician (Urology) Marin Olp Rudell Cobb, MD as Consulting Physician (Oncology)  Advanced Directive information    Allergies  Allergen Reactions  . Fosamax [Alendronate Sodium]     Gastritis  . Oxycodone-Acetaminophen     REACTION: nausea  . Tramadol     Nausea     Chief Complaint  Patient presents with  . Medical Management of Chronic Issues    6 month follow-up. Telehealth     HPI: Patient is a 85 y.o. female via televisit  Pt with more episodes of food getting stuck- she has appt with GI next week for evaluation. Ongoing. She has a lot of issues with bread. Yesterday had an issue with brownies.   Anxiety- reports she has not noticed much but feels like that is due to the time of year and the holidays. Could not do the shopping she wanted to.  Feels like things will get better now. Niece said she is glad she is taking it.   GERD- continues on Protonix, controlled.   Weight loss- does not weigh herself at home but reports she is "eating good" Had gained weight at last visit, has increased to 3 meals a day.   Hyperlipidemia- continues on crestor 20 mg daily  Hx of breast cancer- follows with Dr  Marin Olp, generally yearly  Osteoporosis- continues on cal and vit d, with prolia injection   Review of Systems:  Review of Systems  Constitutional: Negative for chills, fever and weight loss.  HENT: Negative for tinnitus.   Respiratory: Negative for cough, sputum production and shortness of breath.   Cardiovascular: Negative for chest pain, palpitations and leg swelling.  Gastrointestinal: Negative for abdominal pain, constipation, diarrhea and heartburn.  Genitourinary: Negative for dysuria, frequency and urgency.  Musculoskeletal: Negative for back pain, falls, joint pain and myalgias.  Skin: Negative.   Neurological: Negative for dizziness and headaches.  Psychiatric/Behavioral: Negative for depression and memory loss. The patient is nervous/anxious. The patient does not have insomnia.     Past Medical History:  Diagnosis Date  . Arthritis    Bilateral hands  . Cancer ([place])    breast hx of, skin hx of  . Hyperlipidemia   . Nephrolithiasis     X1  . Osteopenia   . Positive H. pylori test 01/27/2017  . Tubular adenoma of colon 01/2013   Past Surgical History:  Procedure Laterality Date  . COLONOSCOPY  2004    negative; Dr Fuller Plan  . colonoscopy with polypectomy  2014  . mastestomy  2003   partial /left  . SKIN CANCER EXCISION     chest , Dr Ubaldo Glassing   Social History:   reports that she has never smoked. She has never used smokeless  tobacco. She reports that she does not drink alcohol or use drugs.  Family History  Problem Relation Age of Onset  . Heart attack Mother 65  . Heart disease Mother   . Lung cancer Father        smoker  . COPD Father   . Cancer Father   . Stroke Sister 26  . Alzheimer's disease Sister   . Heart attack Brother 28  . Heart disease Brother   . Diabetes Neg Hx     Medications: Patient's Medications  New Prescriptions   No medications on file  Previous Medications   ASPIRIN 81 MG TABLET    Take 81 mg by mouth daily.     CALCIUM  CARBONATE-VITAMIN D (CALCIUM + D PO)    Take by mouth daily.    CHOLECALCIFEROL (VITAMIN D3) 1000 UNITS CAPS    Take by mouth 2 (two) times daily.    COENZYME Q10 (COQ10 PO)    Take 1 tablet by mouth daily.   FOLIC ACID (FOLATE PO)    Take 1 tablet by mouth daily.   MULTIPLE VITAMIN (MULTIVITAMIN) TABLET    Take 1 tablet by mouth daily.     MULTIPLE VITAMINS-MINERALS (VISION-VITE PRESERVE PO)    Take by mouth.   PANTOPRAZOLE (PROTONIX) 40 MG TABLET    Take 1 tablet by mouth once daily   ROSUVASTATIN (CRESTOR) 20 MG TABLET    TAKE 1 TABLET BY MOUTH AT BEDTIME   SERTRALINE (ZOLOFT) 25 MG TABLET    Take 1 tablet (25 mg total) by mouth daily.   VITAMIN B-12 (CYANOCOBALAMIN) 1000 MCG TABLET    Take 1,000 mcg by mouth daily.   VITAMIN C (ASCORBIC ACID) 250 MG TABLET    Take 500 mg by mouth daily.    VITAMIN E 400 UNITS TABS    Take by mouth daily.  Modified Medications   No medications on file  Discontinued Medications   No medications on file    Physical Exam:  There were no vitals filed for this visit. There is no height or weight on file to calculate BMI. Wt Readings from Last 3 Encounters:  05/14/19 97 lb 3.2 oz (44.1 kg)  04/24/19 96 lb (43.5 kg)  03/26/19 96 lb 6.4 oz (43.7 kg)     Labs reviewed: Basic Metabolic Panel: Recent Labs    09/12/18 1539 12/21/18 0908 04/24/19 0938  NA 142 140 140  K 4.5 4.3 4.1  CL 107 102 101  CO2 29 32 32  GLUCOSE 126 108* 109*  BUN 20 24* 24*  CREATININE 0.67 0.72 0.81  CALCIUM 8.7 10.0 9.7  TSH  --   --  1.982   Liver Function Tests: Recent Labs    09/12/18 1539 12/21/18 0908 04/24/19 0938  AST '21 22 20  '$ ALT '18 21 18  '$ ALKPHOS  --  49 52  BILITOT 0.4 0.6 0.5  PROT 6.0* 6.7 7.3  ALBUMIN  --  4.6 4.8   No results for input(s): LIPASE, AMYLASE in the last 8760 hours. No results for input(s): AMMONIA in the last 8760 hours. CBC: Recent Labs    10/16/18 1033 12/21/18 0908 04/24/19 0938  WBC 4.3 6.2 5.9  NEUTROABS 2,829 4.4  4.3  HGB 13.9 14.6 14.1  HCT 41.5 45.1 43.2  MCV 94.7 98.3 98.6  PLT 154 155 165   Lipid Panel: No results for input(s): CHOL, HDL, LDLCALC, TRIG, CHOLHDL, LDLDIRECT in the last 8760 hours. TSH: Recent Labs  04/24/19 0938  TSH 1.982   A1C: Lab Results  Component Value Date   HGBA1C 6.2 11/29/2013     Assessment/Plan 1. Do not resuscitate - has DNR form at home, it has not been scanned in but she will bring back to office for [place] to rescan. - DNR (Do Not Resuscitate)  2. Osteoporosis, unspecified osteoporosis type, unspecified pathological fracture presence -continues on prolia injections every 6 months with cal and vit d  3. Weight loss -reports she has gained weight since eating 3 meals a day, feeling well and weight good appetite.  4. Globus sensation Ongoing, appt for evaluation with GI next week.  5. Anxiety -continues to have a lot of anxiety and worry over her husband with dementia, reports the holidays were busy and she is not sure if she was seeing benefit from zoloft. No side effect noted. She will continue current dose and notify if symptoms worsen or fail to improve - BMP with eGFR; Future  6. Familial hypercholesterolemia -continues on crestor 20 mg daily. - Lipid Panel; Future - BMP with eGFR; Future  Next appt: lab work this week, follow up in 4 months, sooner if needed. Carlos American. Harle Battiest  [facility] [phone]    Virtual Visit via Telephone Note  I connected with@ on 06/25/19 at 11:30 AM EST by telephone and verified that I am speaking with the correct person using two identifiers.  Location: Patient: home Provider: office   I discussed the limitations, risks, security and privacy concerns of performing an evaluation and management service by telephone and the availability of in person appointments. I also discussed with the patient that there may be a patient responsible charge related to this service. The  patient expressed understanding and agreed to proceed.   I discussed the assessment and treatment plan with the patient. The patient was provided an opportunity to ask questions and all were answered. The patient agreed with the plan and demonstrated an understanding of the instructions.   The patient was advised to call back or seek an in-person evaluation if the symptoms worsen or if the condition fails to improve as anticipated.  I provided 21 minutes of non-face-to-face time during this encounter.  Carlos American. Harle Battiest Avs printed and mailed

## 2019-06-27 DIAGNOSIS — H353211 Exudative age-related macular degeneration, right eye, with active choroidal neovascularization: Secondary | ICD-10-CM | POA: Diagnosis not present

## 2019-06-27 DIAGNOSIS — H353122 Nonexudative age-related macular degeneration, left eye, intermediate dry stage: Secondary | ICD-10-CM | POA: Diagnosis not present

## 2019-07-03 ENCOUNTER — Ambulatory Visit: Payer: Medicare Other | Admitting: Gastroenterology

## 2019-07-03 ENCOUNTER — Encounter: Payer: Self-pay | Admitting: Gastroenterology

## 2019-07-03 ENCOUNTER — Telehealth: Payer: Self-pay | Admitting: Gastroenterology

## 2019-07-03 VITALS — BP 100/62 | HR 80 | Temp 97.6°F | Ht 62.0 in | Wt 97.0 lb

## 2019-07-03 DIAGNOSIS — R1310 Dysphagia, unspecified: Secondary | ICD-10-CM

## 2019-07-03 DIAGNOSIS — Z1159 Encounter for screening for other viral diseases: Secondary | ICD-10-CM | POA: Diagnosis not present

## 2019-07-03 DIAGNOSIS — K219 Gastro-esophageal reflux disease without esophagitis: Secondary | ICD-10-CM | POA: Diagnosis not present

## 2019-07-03 NOTE — Progress Notes (Signed)
History of Present Illness: This is an 85 year old female referred by Lauree Chandler, NP for the evaluation of dysphagia.  She relates a 1 year history of intermittent difficulty swallowing solids associated with pain and slow transit as well as coughing and choking and regurgitation of food on several occasions.  Reflux symptoms have remained under good control on daily pantoprazole.  She had a mild weight loss that was felt due to insufficient calorie intake and since increasing her calorie intake including use of boost she has gained several pounds. Denies weight loss, abdominal pain, constipation, diarrhea, change in stool caliber, melena, hematochezia, nausea, vomiting.     Allergies  Allergen Reactions  . Fosamax [Alendronate Sodium]     Gastritis  . Oxycodone-Acetaminophen     REACTION: nausea  . Tramadol     Nausea    Outpatient Medications Prior to Visit  Medication Sig Dispense Refill  . aspirin 81 MG tablet Take 81 mg by mouth daily.      . Calcium Carbonate-Vitamin D (CALCIUM + D PO) Take by mouth daily.     . Cholecalciferol (VITAMIN D3) 1000 UNITS CAPS Take by mouth 2 (two) times daily.     . Coenzyme Q10 (COQ10 PO) Take 1 tablet by mouth daily.    . Folic Acid (FOLATE PO) Take 1 tablet by mouth daily.    . Multiple Vitamin (MULTIVITAMIN) tablet Take 1 tablet by mouth daily.      . Multiple Vitamins-Minerals (VISION-VITE PRESERVE PO) Take by mouth.    . pantoprazole (PROTONIX) 40 MG tablet Take 1 tablet by mouth once daily 30 tablet 5  . rosuvastatin (CRESTOR) 20 MG tablet TAKE 1 TABLET BY MOUTH AT BEDTIME 90 tablet 0  . sertraline (ZOLOFT) 25 MG tablet Take 1 tablet (25 mg total) by mouth daily. 30 tablet 1  . vitamin B-12 (CYANOCOBALAMIN) 1000 MCG tablet Take 1,000 mcg by mouth daily.    . vitamin C (ASCORBIC ACID) 250 MG tablet Take 500 mg by mouth daily.     . Vitamin E 400 UNITS TABS Take by mouth daily.     No facility-administered medications prior to  visit.   Past Medical History:  Diagnosis Date  . Arthritis    Bilateral hands  . Cancer ([place])    breast hx of, skin hx of  . Hyperlipidemia   . Nephrolithiasis     X1  . Osteopenia   . Positive H. pylori test 01/27/2017  . Tubular adenoma of colon 01/2013   Past Surgical History:  Procedure Laterality Date  . COLONOSCOPY  2004    negative; Dr Fuller Plan  . colonoscopy with polypectomy  2014  . mastestomy  2003   partial /left  . SKIN CANCER EXCISION     chest , Dr Ubaldo Glassing   Social History   Socioeconomic History  . Marital status: Married    Spouse name: Not on file  . Number of children: 1  . Years of education: Not on file  . Highest education level: Not on file  Occupational History  . Not on file  Tobacco Use  . Smoking status: Never Smoker  . Smokeless tobacco: Never Used  . Tobacco comment: NEVER USED TOBACCO  Substance and Sexual Activity  . Alcohol use: No    Alcohol/week: 0.0 standard drinks  . Drug use: No  . Sexual activity: Not Currently  Other Topics Concern  . Not on file  Social History Narrative   Social History  Diet? Healthy diet      Do you drink/eat things with caffeine? Very little      Marital status?            married                        What year were you married? 1957      Do you live in a house, apartment, assisted living, condo, trailer, etc.? house      Is it one or more stories? 2      How many persons live in your home? 2      Do you have any pets in your home? (please list) dog and cat      Highest level of education completed? High school      Current or past profession: Architect      Advanced Directives      Do you exercise?         Very little                             Type & how often? walk      Do you have a living will? yes      Do you have a DNR form?       no                           If not, do you want to discuss one? no      Do you have signed POA/HPOA for forms? yes       Functional Status      Do you have difficulty bathing or dressing yourself? no      Do you have difficulty preparing food or eating? no      Do you have difficulty managing your medications? no      Do you have difficulty managing your finances? no      Do you have difficulty affording your medications? no   Social Determinants of Health   Financial Resource Strain:   . Difficulty of Paying Living Expenses: Not on file  Food Insecurity:   . Worried About Charity fundraiser in the Last Year: Not on file  . Ran Out of Food in the Last Year: Not on file  Transportation Needs:   . Lack of Transportation (Medical): Not on file  . Lack of Transportation (Non-Medical): Not on file  Physical Activity:   . Days of Exercise per Week: Not on file  . Minutes of Exercise per Session: Not on file  Stress:   . Feeling of Stress : Not on file  Social Connections:   . Frequency of Communication with Friends and Family: Not on file  . Frequency of Social Gatherings with Friends and Family: Not on file  . Attends Religious Services: Not on file  . Active Member of Clubs or Organizations: Not on file  . Attends Archivist Meetings: Not on file  . Marital Status: Not on file   Family History  Problem Relation Age of Onset  . Heart attack Mother 42  . Heart disease Mother   . Lung cancer Father        smoker  . COPD Father   . Cancer Father   . Stroke Sister 20  . Alzheimer's disease Sister   . Heart attack Brother 17  .  Heart disease Brother   . Diabetes Neg Hx      Review of Systems: Pertinent positive and negative review of systems were noted in the above HPI section. All other review of systems were otherwise negative.   Physical Exam: General: Well developed, well nourished, no acute distress Head: Normocephalic and atraumatic Eyes:  sclerae anicteric, EOMI Ears: Normal auditory acuity Mouth: No deformity or lesions Neck: Supple, no masses or thyromegaly Lungs:  Clear throughout to auscultation Heart: Regular rate and rhythm; no murmurs, rubs or bruits Abdomen: Soft, non tender and non distended. No masses, hepatosplenomegaly or hernias noted. Normal Bowel sounds Rectal: not done Musculoskeletal: Symmetrical with no gross deformities  Skin: No lesions on visible extremities Pulses:  Normal pulses noted Extremities: No clubbing, cyanosis, edema or deformities noted Neurological: Alert oriented x 4, grossly nonfocal Cervical Nodes:  No significant cervical adenopathy Inguinal Nodes: No significant inguinal adenopathy Psychological:  Alert and cooperative. Anxious.    Assessment and Recommendations:  1. Dysphagia with solids associated with transient chest pain, coughing and choking. GERD. Continue pantoprazole 40 mg po qd. follow antireflux measures.  Schedule barium esophagram.  Schedule EGD with possible dilation. The risks (including bleeding, perforation, infection, missed lesions, medication reactions and possible hospitalization or surgery if complications occur), benefits, and alternatives to endoscopy with possible biopsy and possible dilation were discussed with the patient and they consent to proceed.     cc: Lauree Chandler, NP [place],  [place] [phone]

## 2019-07-03 NOTE — Telephone Encounter (Signed)
Pt stated that she is scheduled for Xray 07/06/19 and would like to reschedule due to weather.

## 2019-07-03 NOTE — Patient Instructions (Signed)
You have been scheduled for a Barium Esophogram at [facility] (1st floor of the hospital) on 07/05/18 at 9:00am. Please arrive 15 minutes prior to your appointment for registration. Make certain not to have anything to eat or drink 3 hours prior to your test. If you need to reschedule for any reason, please contact radiology at [phone] to do so. __________________________________________________________________ A barium swallow is an examination that concentrates on views of the esophagus. This tends to be a double contrast exam (barium and two liquids which, when combined, create a gas to distend the wall of the oesophagus) or single contrast (non-ionic iodine based). The study is usually tailored to your symptoms so a good history is essential. Attention is paid during the study to the form, structure and configuration of the esophagus, looking for functional disorders (such as aspiration, dysphagia, achalasia, motility and reflux) EXAMINATION You may be asked to change into a gown, depending on the type of swallow being performed. A radiologist and radiographer will perform the procedure. The radiologist will advise you of the type of contrast selected for your procedure and direct you during the exam. You will be asked to stand, sit or lie in several different positions and to hold a small amount of fluid in your mouth before being asked to swallow while the imaging is performed .In some instances you may be asked to swallow barium coated marshmallows to assess the motility of a solid food bolus. The exam can be recorded as a digital or video fluoroscopy procedure. POST PROCEDURE It will take 1-2 days for the barium to pass through your system. To facilitate this, it is important, unless otherwise directed, to increase your fluids for the next 24-48hrs and to resume your normal diet.  This test typically takes about 30 minutes to  perform. __________________________________________________________________  Whitney Donaldson have been scheduled for an endoscopy. Please follow written instructions given to you at your visit today. If you use inhalers (even only as needed), please bring them with you on the day of your procedure. Your physician has requested that you go to www.startemmi.com and enter the access code given to you at your visit today. This web site gives a general overview about your procedure. However, you should still follow specific instructions given to you by our office regarding your preparation for the procedure.  Thank you for choosing me and [facility].  Pricilla Riffle. Dagoberto Ligas., MD., [facility]  Due to recent changes in healthcare laws, you may see the results of your imaging and laboratory studies on MyChart before your provider has had a chance to review them.  We understand that in some cases there may be results that are confusing or concerning to you. Not all laboratory results come back in the same time frame and the provider may be waiting for multiple results in order to interpret others.  Please give [place] 48 hours in order for your provider to thoroughly review all the results before contacting the office for clarification of your results.

## 2019-07-04 ENCOUNTER — Other Ambulatory Visit: Payer: Self-pay

## 2019-07-04 ENCOUNTER — Other Ambulatory Visit: Payer: Medicare Other

## 2019-07-04 DIAGNOSIS — R058 Other specified cough: Secondary | ICD-10-CM

## 2019-07-04 DIAGNOSIS — F419 Anxiety disorder, unspecified: Secondary | ICD-10-CM

## 2019-07-04 DIAGNOSIS — E7801 Familial hypercholesterolemia: Secondary | ICD-10-CM

## 2019-07-04 DIAGNOSIS — R05 Cough: Secondary | ICD-10-CM

## 2019-07-04 NOTE — Telephone Encounter (Signed)
Patient has been provided the radiology central scheduling phone number to reschedule her imaging study

## 2019-07-05 LAB — BASIC METABOLIC PANEL WITH GFR
BUN: 21 mg/dL (ref 7–25)
CO2: 31 mmol/L (ref 20–32)
Calcium: 9.3 mg/dL (ref 8.6–10.4)
Chloride: 102 mmol/L (ref 98–110)
Creat: 0.68 mg/dL (ref 0.60–0.88)
GFR, Est African American: 93 mL/min/{1.73_m2} (ref >=60)
GFR, Est Non African American: 80 mL/min/{1.73_m2} (ref >=60)
Glucose, Bld: 98 mg/dL (ref 65–99)
Potassium: 4.3 mmol/L (ref 3.5–5.3)
Sodium: 141 mmol/L (ref 135–146)

## 2019-07-05 LAB — LIPID PANEL
Cholesterol: 212 mg/dL — ABNORMAL HIGH (ref <200)
HDL: 78 mg/dL (ref >=50)
LDL Cholesterol (Calc): 115 mg/dL (calc) — ABNORMAL HIGH
Non-HDL Cholesterol (Calc): 134 mg/dL (calc) — ABNORMAL HIGH (ref <130)
Total CHOL/HDL Ratio: 2.7 (calc) (ref <5.0)
Triglycerides: 86 mg/dL (ref <150)

## 2019-07-06 ENCOUNTER — Ambulatory Visit (HOSPITAL_COMMUNITY): Admission: RE | Admit: 2019-07-06 | Payer: Medicare Other | Source: Ambulatory Visit

## 2019-07-10 ENCOUNTER — Other Ambulatory Visit: Payer: Self-pay | Admitting: Gastroenterology

## 2019-07-10 ENCOUNTER — Other Ambulatory Visit: Payer: Medicare HMO

## 2019-07-10 ENCOUNTER — Ambulatory Visit (INDEPENDENT_AMBULATORY_CARE_PROVIDER_SITE_OTHER): Payer: Medicare Other

## 2019-07-10 DIAGNOSIS — Z1159 Encounter for screening for other viral diseases: Secondary | ICD-10-CM

## 2019-07-10 LAB — SARS CORONAVIRUS 2 (TAT 6-24 HRS): SARS Coronavirus 2: NEGATIVE

## 2019-07-11 ENCOUNTER — Other Ambulatory Visit: Payer: Self-pay

## 2019-07-11 ENCOUNTER — Other Ambulatory Visit (HOSPITAL_COMMUNITY): Payer: Self-pay

## 2019-07-11 ENCOUNTER — Ambulatory Visit (HOSPITAL_COMMUNITY)
Admission: RE | Admit: 2019-07-11 | Discharge: 2019-07-11 | Disposition: A | Discharge status: Home / Self Care | Payer: Medicare Other | Source: Ambulatory Visit | Attending: Gastroenterology | Admitting: Gastroenterology

## 2019-07-11 ENCOUNTER — Telehealth: Payer: Self-pay | Admitting: Gastroenterology

## 2019-07-11 DIAGNOSIS — R1310 Dysphagia, unspecified: Secondary | ICD-10-CM

## 2019-07-11 DIAGNOSIS — K219 Gastro-esophageal reflux disease without esophagitis: Secondary | ICD-10-CM

## 2019-07-11 DIAGNOSIS — T17908A Unspecified foreign body in respiratory tract, part unspecified causing other injury, initial encounter: Secondary | ICD-10-CM

## 2019-07-12 ENCOUNTER — Ambulatory Visit (AMBULATORY_SURGERY_CENTER): Payer: Medicare Other | Admitting: Gastroenterology

## 2019-07-12 ENCOUNTER — Encounter: Payer: Self-pay | Admitting: Gastroenterology

## 2019-07-12 VITALS — BP 148/61 | HR 80 | Temp 97.9°F | Resp 11 | Ht 62.0 in | Wt 97.0 lb

## 2019-07-12 DIAGNOSIS — R933 Abnormal findings on diagnostic imaging of other parts of digestive tract: Secondary | ICD-10-CM

## 2019-07-12 DIAGNOSIS — R1310 Dysphagia, unspecified: Secondary | ICD-10-CM | POA: Diagnosis not present

## 2019-07-12 DIAGNOSIS — K259 Gastric ulcer, unspecified as acute or chronic, without hemorrhage or perforation: Secondary | ICD-10-CM | POA: Diagnosis not present

## 2019-07-12 DIAGNOSIS — K3189 Other diseases of stomach and duodenum: Secondary | ICD-10-CM

## 2019-07-12 DIAGNOSIS — K2970 Gastritis, unspecified, without bleeding: Secondary | ICD-10-CM | POA: Diagnosis not present

## 2019-07-12 MED ORDER — SODIUM CHLORIDE 0.9 % IV SOLN: 500.0000 mL | INTRAVENOUS | Status: AC

## 2019-07-12 NOTE — Patient Instructions (Signed)
YOU HAD AN ENDOSCOPIC PROCEDURE TODAY AT [facility]:   Refer to the procedure report that was given to you for any specific questions about what was found during the examination.  If the procedure report does not answer your questions, please call your gastroenterologist to clarify.  If you requested that your care partner not be given the details of your procedure findings, then the procedure report has been included in a sealed envelope for you to review at your convenience later.  YOU SHOULD EXPECT: Some feelings of bloating in the abdomen. Passage of more gas than usual.  Walking can help get rid of the air that was put into your GI tract during the procedure and reduce the bloating. If you had a lower endoscopy (such as a colonoscopy or flexible sigmoidoscopy) you may notice spotting of blood in your stool or on the toilet paper. If you underwent a bowel prep for your procedure, you may not have a normal bowel movement for a few days.  Please Note:  You might notice some irritation and congestion in your nose or some drainage.  This is from the oxygen used during your procedure.  There is no need for concern and it should clear up in a day or so.  SYMPTOMS TO REPORT IMMEDIATELY:   Following upper endoscopy (EGD)  Vomiting of blood or coffee ground material  New chest pain or pain under the shoulder blades  Painful or persistently difficult swallowing  New shortness of breath  Fever of 100F or higher  Black, tarry-looking stools  For urgent or emergent issues, a gastroenterologist can be reached at any hour by calling [phone].   DIET:  Follow a Post-Esophageal Dilation Diet (see handout given to you by your recovery nurse): Nothing by mouth until 12:00pm today, then Clear Liquids Only from 12:00pm to 2:00pm, then a Soft diet for the rest of the day today. You may advance to a regular diet tomorrow as tolerated.  Drink plenty of fluids but you should avoid alcoholic  beverages for 24 hours.  MEDICATIONS: Continue present medications.  Please see handouts given to you by your recovery nurse.  ACTIVITY:  You should plan to take it easy for the rest of today and you should NOT DRIVE or use heavy machinery until tomorrow (because of the sedation medicines used during the test).    FOLLOW UP: Our staff will call the number listed on your records 48-72 hours following your procedure to check on you and address any questions or concerns that you may have regarding the information given to you following your procedure. If we do not reach you, we will leave a message.  We will attempt to reach you two times.  During this call, we will ask if you have developed any symptoms of COVID 19. If you develop any symptoms (ie: fever, flu-like symptoms, shortness of breath, cough etc.) before then, please call [phone].  If you test positive for Covid 19 in the 2 weeks post procedure, please call and report this information to [place].    If any biopsies were taken you will be contacted by phone or by letter within the next 1-3 weeks.  Please call us at [phone] if you have not heard about the biopsies in 3 weeks.   Thank you for allowing [place] to provide for your healthcare needs today.   SIGNATURES/CONFIDENTIALITY: You and/or your care partner have signed paperwork which will be entered into your electronic medical record.  These signatures attest to the fact that that the information above on your After Visit Summary has been reviewed and is understood.  Full responsibility of the confidentiality of this discharge information lies with you and/or your care-partner.

## 2019-07-12 NOTE — Progress Notes (Signed)
Temp LC V/S CW 

## 2019-07-12 NOTE — Op Note (Signed)
[place] Patient Name: Whitney Donaldson Procedure Date: 07/12/2019 10:27 AM MRN: [id number] Endoscopist: Ladene Artist , MD Age: 86 Referring MD:  Date of Birth: 1932-10-27 Gender: Female Account #: [id number] Procedure:                Upper GI endoscopy Indications:              Dysphagia, Abnormal esophagram (? polyp in cervical                            esophagus) Medicines:                Monitored Anesthesia Care Procedure:                Pre-Anesthesia Assessment:                           - Prior to the procedure, a History and Physical                            was performed, and patient medications and                            allergies were reviewed. The patient's tolerance of                            previous anesthesia was also reviewed. The risks                            and benefits of the procedure and the sedation                            options and risks were discussed with the patient.                            All questions were answered, and informed consent                            was obtained. Prior Anticoagulants: The patient has                            taken no previous anticoagulant or antiplatelet                            agents. ASA Grade Assessment: II - A patient with                            mild systemic disease. After reviewing the risks                            and benefits, the patient was deemed in                            satisfactory condition to undergo the procedure.  After obtaining informed consent, the endoscope was                            passed under direct vision. Throughout the                            procedure, the patient's blood pressure, pulse, and                            oxygen saturations were monitored continuously. The                            Endoscope was introduced through the mouth, and                            advanced to the second part of duodenum.  The upper                            GI endoscopy was accomplished without difficulty.                            The patient tolerated the procedure well. Scope In: Scope Out: Findings:                 No endoscopic abnormality was evident in the                            esophagus to explain the patient's complaint of                            dysphagia. It was decided, however, to proceed with                            dilation of the entire esophagus. A guidewire was                            placed and the scope was withdrawn. Dilation was                            performed with a Savary dilator with no resistance                            at 16 mm. Esophagus carefully examined several                            times without cervical esophageal abnormalities                            noted.                           Localized mild inflammation characterized by                            erythema and  granularity was found in the gastric                            body. Biopsies were taken with a cold forceps for                            histology.                           A small hiatal hernia was present.                           The exam of the stomach was otherwise normal.                           The duodenal bulb and second portion of the                            duodenum were normal. Complications:            No immediate complications. Estimated Blood Loss:     Estimated blood loss was minimal. Impression:               - No endoscopic esophageal abnormality to explain                            patient's dysphagia. Esophagus dilated.                           - Gastritis. Biopsied.                           - Small hiatal hernia.                           - Normal duodenal bulb and second portion of the                            duodenum. Recommendation:           - Patient has a contact number available for                            emergencies. The signs  and symptoms of potential                            delayed complications were discussed with the                            patient. Return to normal activities tomorrow.                            Written discharge instructions were provided to the                            patient.                           -  Clear liquid diet for 2 hours, then advance as                            tolerated to soft diet today.                           - Resume prior diet tomorrow.                           - Continue present medications.                           - Await pathology results.                           - Proceed wtih MBSS as scheduled. Ladene Artist, MD 07/12/2019 11:02:32 AM This report has been signed electronically.

## 2019-07-12 NOTE — Telephone Encounter (Signed)
Patient was notified of the results and the new recommendations She imaging results for additional details

## 2019-07-12 NOTE — Progress Notes (Signed)
PT taken to PACU. Monitors in place. VSS. Report given to RN. 

## 2019-07-13 ENCOUNTER — Other Ambulatory Visit: Payer: Self-pay | Admitting: Nurse Practitioner

## 2019-07-13 DIAGNOSIS — F419 Anxiety disorder, unspecified: Secondary | ICD-10-CM

## 2019-07-13 NOTE — Telephone Encounter (Signed)
rx sent to pharmacy by e-script  

## 2019-07-16 ENCOUNTER — Telehealth: Payer: Self-pay

## 2019-07-16 NOTE — Telephone Encounter (Signed)
  Follow up Call-  Call back number 07/12/2019  Post procedure Call Back phone  # [phone]  Permission to leave phone message Yes  Some recent data might be hidden     Patient questions:  Do you have a fever, pain , or abdominal swelling? No. Pain Score  0 *  Have you tolerated food without any problems? Yes.    Have you been able to return to your normal activities? Yes.    Do you have any questions about your discharge instructions: Diet   No. Medications  No. Follow up visit  No.  Do you have questions or concerns about your Care? No.  Actions: * If pain score is 4 or above: No action needed, pain <4.  Pt said her back was a little sore.  Felt the soreness when she bends down.  Pt said this am she feels better.  I asked her to call us back if the soreness returns.  Pt said she would.  Maw  1. Have you developed a fever since your procedure? No   2.   Have you had an respiratory symptoms (SOB or cough) since your procedure?no   3.   Have you tested positive for COVID 19 since your procedure no   4.   Have you had any family members/close contacts diagnosed with the COVID 19 since your procedure?  no   If yes to any of these questions please route to Joylene John, RN and Alphonsa Gin, Therapist, sports.

## 2019-07-17 ENCOUNTER — Encounter: Payer: Self-pay | Admitting: Gastroenterology

## 2019-07-19 ENCOUNTER — Ambulatory Visit (HOSPITAL_COMMUNITY)
Admission: RE | Admit: 2019-07-19 | Discharge: 2019-07-19 | Disposition: A | Discharge status: Home / Self Care | Payer: Medicare Other | Source: Ambulatory Visit | Attending: Gastroenterology | Admitting: Gastroenterology

## 2019-07-19 ENCOUNTER — Other Ambulatory Visit: Payer: Self-pay

## 2019-07-19 DIAGNOSIS — R1310 Dysphagia, unspecified: Secondary | ICD-10-CM | POA: Insufficient documentation

## 2019-07-19 DIAGNOSIS — T17908A Unspecified foreign body in respiratory tract, part unspecified causing other injury, initial encounter: Secondary | ICD-10-CM

## 2019-07-19 NOTE — Therapy (Signed)
Modified Barium Swallow Progress Note  Patient Details  Name: Whitney Donaldson MRN: [id number] Date of Birth: [birth year]  Today's Date: 07/19/2019  Modified Barium Swallow completed.  Full report located under Chart Review in the Imaging Section.  Brief recommendations include the following:  Clinical Impression Pt was seen for outpatient MBS following aspiration during the swallow of water when taking a barium tablet during a recent esophagram. Pt presents with normal oral swallow. Pharyngeal swallow is mildly impaired, but within functional limits for pt age. Trigger of swallow reflex was timely. Reduced tongue base retraction was slightly reduced, and resulted in mild vallecular sinus residue after the swallow. Dry swallow reduced vallecular residue. Flash penetration was seen in trace amounts during large and/or consecutive boluses of thin liquid via cup and straw. No penetration of puree or solid textures was seen, and penetration was prevented with individual sips of thin liquid. No aspiration was seen on any consistency. Recommend continuing with regular solids, thin liquids. Meds may be taken whole with cup sips of liquid, or with puree textures. Pt voiced concern about "doing something wrong with breathing and swallowing", so SLP provided written information regarding respiration and swallowing. This information was reviewed with pt, and she was able to have questions answered. No further ST intervention is recommended at this time. Please reconsult if needs arise in the future.   Swallow Evaluation Recommendation  SLP Diet Recommendations: Regular solids;Thin liquid   Liquid Administration via: Cup;Straw   Medication Administration: Whole meds with liquid   Supervision: Patient able to self feed   Compensations: Minimize environmental distractions;Slow rate;Small sips/bites   Postural Changes: Seated upright at 90 degrees   Oral Care Recommendations: Oral care BID      [name]  B. Quentin Ore, [facility] [place] Speech Language Pathologist Office: [phone] Pager: [phone]   Shonna Chock 07/19/2019,1:49 PM

## 2019-07-19 NOTE — Progress Notes (Signed)
Pt presents with normal oral swallow. Pharyngeal swallow is mildly impaired, but within functional limits for pt age.   Reduced tongue base retraction was slightly reduced, and resulted in mild vallecular sinus residue after the swallow. Dry swallow reduced vallecular residue. Flash penetration was seen in trace amounts during large and/or consecutive boluses of thin liquid via cup and straw. No penetration of puree or solid textures was seen, and penetration was prevented with individual sips of thin liquid. No aspiration was seen on any consistency.   Mild pharyngeal impairment likely cause of symptoms. Follow recommendations made by SLP

## 2019-07-26 ENCOUNTER — Other Ambulatory Visit: Payer: Self-pay | Admitting: Nurse Practitioner

## 2019-07-26 DIAGNOSIS — E782 Mixed hyperlipidemia: Secondary | ICD-10-CM

## 2019-08-01 ENCOUNTER — Other Ambulatory Visit: Payer: Self-pay | Admitting: Nurse Practitioner

## 2019-08-01 ENCOUNTER — Ambulatory Visit: Payer: Medicare HMO

## 2019-08-01 DIAGNOSIS — A048 Other specified bacterial intestinal infections: Secondary | ICD-10-CM

## 2019-08-01 DIAGNOSIS — R198 Other specified symptoms and signs involving the digestive system and abdomen: Secondary | ICD-10-CM

## 2019-08-01 DIAGNOSIS — R0989 Other specified symptoms and signs involving the circulatory and respiratory systems: Secondary | ICD-10-CM

## 2019-08-07 ENCOUNTER — Other Ambulatory Visit: Payer: Self-pay | Admitting: Nurse Practitioner

## 2019-08-07 DIAGNOSIS — F419 Anxiety disorder, unspecified: Secondary | ICD-10-CM

## 2019-08-08 ENCOUNTER — Ambulatory Visit (INDEPENDENT_AMBULATORY_CARE_PROVIDER_SITE_OTHER): Payer: Medicare Other

## 2019-08-08 ENCOUNTER — Other Ambulatory Visit: Payer: Self-pay

## 2019-08-08 DIAGNOSIS — M810 Age-related osteoporosis without current pathological fracture: Secondary | ICD-10-CM

## 2019-08-08 MED ORDER — DENOSUMAB 60 MG/ML ~~LOC~~ SOSY
60.0000 mg | PREFILLED_SYRINGE | Freq: Once | SUBCUTANEOUS | Status: AC
  Administered 2019-08-08: 60 mg via SUBCUTANEOUS

## 2019-08-24 ENCOUNTER — Inpatient Hospital Stay (HOSPITAL_COMMUNITY)
Admission: EM | Admit: 2019-08-24 | Discharge: 2019-08-27 | DRG: 064 | Disposition: E | Payer: Medicare Other | Attending: Student in an Organized Health Care Education/Training Program | Admitting: Student in an Organized Health Care Education/Training Program

## 2019-08-24 ENCOUNTER — Inpatient Hospital Stay (HOSPITAL_COMMUNITY): Payer: Medicare Other

## 2019-08-24 ENCOUNTER — Encounter (HOSPITAL_COMMUNITY): Payer: Self-pay | Admitting: Certified Registered Nurse Anesthetist

## 2019-08-24 ENCOUNTER — Encounter (HOSPITAL_COMMUNITY): Payer: Self-pay | Admitting: Student in an Organized Health Care Education/Training Program

## 2019-08-24 ENCOUNTER — Encounter (HOSPITAL_COMMUNITY)
Admission: EM | Disposition: E | Payer: Self-pay | Source: Home / Self Care | Attending: Student in an Organized Health Care Education/Training Program

## 2019-08-24 ENCOUNTER — Emergency Department (HOSPITAL_COMMUNITY): Payer: Medicare Other

## 2019-08-24 ENCOUNTER — Other Ambulatory Visit: Payer: Self-pay

## 2019-08-24 DIAGNOSIS — Z853 Personal history of malignant neoplasm of breast: Secondary | ICD-10-CM

## 2019-08-24 DIAGNOSIS — G936 Cerebral edema: Secondary | ICD-10-CM | POA: Diagnosis present

## 2019-08-24 DIAGNOSIS — K219 Gastro-esophageal reflux disease without esophagitis: Secondary | ICD-10-CM | POA: Diagnosis present

## 2019-08-24 DIAGNOSIS — I610 Nontraumatic intracerebral hemorrhage in hemisphere, subcortical: Secondary | ICD-10-CM | POA: Diagnosis not present

## 2019-08-24 DIAGNOSIS — I619 Nontraumatic intracerebral hemorrhage, unspecified: Secondary | ICD-10-CM | POA: Diagnosis not present

## 2019-08-24 DIAGNOSIS — R29720 NIHSS score 20: Secondary | ICD-10-CM | POA: Diagnosis present

## 2019-08-24 DIAGNOSIS — R471 Dysarthria and anarthria: Secondary | ICD-10-CM | POA: Diagnosis present

## 2019-08-24 DIAGNOSIS — Z681 Body mass index (BMI) 19 or less, adult: Secondary | ICD-10-CM

## 2019-08-24 DIAGNOSIS — R634 Abnormal weight loss: Secondary | ICD-10-CM | POA: Diagnosis present

## 2019-08-24 DIAGNOSIS — F419 Anxiety disorder, unspecified: Secondary | ICD-10-CM | POA: Diagnosis present

## 2019-08-24 DIAGNOSIS — Z66 Do not resuscitate: Secondary | ICD-10-CM | POA: Diagnosis present

## 2019-08-24 DIAGNOSIS — I680 Cerebral amyloid angiopathy: Secondary | ICD-10-CM | POA: Diagnosis present

## 2019-08-24 DIAGNOSIS — Z20822 Contact with and (suspected) exposure to covid-19: Secondary | ICD-10-CM | POA: Diagnosis present

## 2019-08-24 DIAGNOSIS — Z7982 Long term (current) use of aspirin: Secondary | ICD-10-CM | POA: Diagnosis not present

## 2019-08-24 DIAGNOSIS — I611 Nontraumatic intracerebral hemorrhage in hemisphere, cortical: Secondary | ICD-10-CM | POA: Diagnosis not present

## 2019-08-24 DIAGNOSIS — E785 Hyperlipidemia, unspecified: Secondary | ICD-10-CM | POA: Diagnosis present

## 2019-08-24 DIAGNOSIS — R29810 Facial weakness: Secondary | ICD-10-CM | POA: Diagnosis present

## 2019-08-24 DIAGNOSIS — Z8249 Family history of ischemic heart disease and other diseases of the circulatory system: Secondary | ICD-10-CM | POA: Diagnosis not present

## 2019-08-24 DIAGNOSIS — G8191 Hemiplegia, unspecified affecting right dominant side: Secondary | ICD-10-CM | POA: Diagnosis present

## 2019-08-24 DIAGNOSIS — Z801 Family history of malignant neoplasm of trachea, bronchus and lung: Secondary | ICD-10-CM | POA: Diagnosis not present

## 2019-08-24 DIAGNOSIS — E854 Organ-limited amyloidosis: Secondary | ICD-10-CM | POA: Diagnosis present

## 2019-08-24 DIAGNOSIS — I161 Hypertensive emergency: Secondary | ICD-10-CM | POA: Diagnosis present

## 2019-08-24 DIAGNOSIS — M8580 Other specified disorders of bone density and structure, unspecified site: Secondary | ICD-10-CM | POA: Diagnosis present

## 2019-08-24 DIAGNOSIS — Z825 Family history of asthma and other chronic lower respiratory diseases: Secondary | ICD-10-CM

## 2019-08-24 DIAGNOSIS — G935 Compression of brain: Secondary | ICD-10-CM | POA: Diagnosis present

## 2019-08-24 DIAGNOSIS — Z515 Encounter for palliative care: Secondary | ICD-10-CM | POA: Diagnosis present

## 2019-08-24 DIAGNOSIS — Z85828 Personal history of other malignant neoplasm of skin: Secondary | ICD-10-CM | POA: Diagnosis not present

## 2019-08-24 DIAGNOSIS — Z79899 Other long term (current) drug therapy: Secondary | ICD-10-CM

## 2019-08-24 LAB — CBG MONITORING, ED: Glucose-Capillary: 145 mg/dL — ABNORMAL HIGH (ref 70–99)

## 2019-08-24 LAB — I-STAT CHEM 8, ED
BUN: 19 mg/dL (ref 8–23)
Calcium, Ion: 1.04 mmol/L — ABNORMAL LOW (ref 1.15–1.40)
Chloride: 105 mmol/L (ref 98–111)
Creatinine, Ser: 0.5 mg/dL (ref 0.44–1.00)
Glucose, Bld: 142 mg/dL — ABNORMAL HIGH (ref 70–99)
HCT: 46 % (ref 36.0–46.0)
Hemoglobin: 15.6 g/dL — ABNORMAL HIGH (ref 12.0–15.0)
Potassium: 3.8 mmol/L (ref 3.5–5.1)
Sodium: 141 mmol/L (ref 135–145)
TCO2: 28 mmol/L (ref 22–32)

## 2019-08-24 LAB — COMPREHENSIVE METABOLIC PANEL
ALT: 27 U/L (ref 0–44)
AST: 32 U/L (ref 15–41)
Albumin: 4.4 g/dL (ref 3.5–5.0)
Alkaline Phosphatase: 54 U/L (ref 38–126)
Anion gap: 11 (ref 5–15)
BUN: 18 mg/dL (ref 8–23)
CO2: 26 mmol/L (ref 22–32)
Calcium: 8.8 mg/dL — ABNORMAL LOW (ref 8.9–10.3)
Chloride: 102 mmol/L (ref 98–111)
Creatinine, Ser: 0.7 mg/dL (ref 0.44–1.00)
GFR calc Af Amer: >60 mL/min (ref >60)
GFR calc non Af Amer: >60 mL/min (ref >60)
Glucose, Bld: 147 mg/dL — ABNORMAL HIGH (ref 70–99)
Potassium: 3.8 mmol/L (ref 3.5–5.1)
Sodium: 139 mmol/L (ref 135–145)
Total Bilirubin: 0.6 mg/dL (ref 0.3–1.2)
Total Protein: 7 g/dL (ref 6.5–8.1)

## 2019-08-24 LAB — RESPIRATORY PANEL BY RT PCR (FLU A&B, COVID)
Influenza A by PCR: NEGATIVE
Influenza B by PCR: NEGATIVE
SARS Coronavirus 2 by RT PCR: NEGATIVE

## 2019-08-24 LAB — CBC
HCT: 46.9 % — ABNORMAL HIGH (ref 36.0–46.0)
Hemoglobin: 15.1 g/dL — ABNORMAL HIGH (ref 12.0–15.0)
MCH: 31.9 pg (ref 26.0–34.0)
MCHC: 32.2 g/dL (ref 30.0–36.0)
MCV: 99.2 fL (ref 80.0–100.0)
Platelets: 151 10*3/uL (ref 150–400)
RBC: 4.73 MIL/uL (ref 3.87–5.11)
RDW: 12 % (ref 11.5–15.5)
WBC: 8.2 10*3/uL (ref 4.0–10.5)
nRBC: 0 % (ref 0.0–0.2)

## 2019-08-24 LAB — DIFFERENTIAL
Abs Immature Granulocytes: 0.04 10*3/uL (ref 0.00–0.07)
Basophils Absolute: 0 10*3/uL (ref 0.0–0.1)
Basophils Relative: 1 %
Eosinophils Absolute: 0 10*3/uL (ref 0.0–0.5)
Eosinophils Relative: 0 %
Immature Granulocytes: 1 %
Lymphocytes Relative: 12 %
Lymphs Abs: 1 10*3/uL (ref 0.7–4.0)
Monocytes Absolute: 0.5 10*3/uL (ref 0.1–1.0)
Monocytes Relative: 6 %
Neutro Abs: 6.6 10*3/uL (ref 1.7–7.7)
Neutrophils Relative %: 80 %

## 2019-08-24 LAB — HEMOGLOBIN A1C
Hgb A1c MFr Bld: 5.9 % — ABNORMAL HIGH (ref 4.8–5.6)
Mean Plasma Glucose: 122.63 mg/dL

## 2019-08-24 LAB — LIPID PANEL
Cholesterol: 190 mg/dL (ref 0–200)
HDL: 74 mg/dL (ref >40)
LDL Cholesterol: 105 mg/dL — ABNORMAL HIGH (ref 0–99)
Total CHOL/HDL Ratio: 2.6 RATIO
Triglycerides: 53 mg/dL (ref <150)
VLDL: 11 mg/dL (ref 0–40)

## 2019-08-24 LAB — APTT: aPTT: 23 seconds — ABNORMAL LOW (ref 24–36)

## 2019-08-24 LAB — PROTIME-INR
INR: 0.9 (ref 0.8–1.2)
Prothrombin Time: 12.4 seconds (ref 11.4–15.2)

## 2019-08-24 LAB — SODIUM: Sodium: 138 mmol/L (ref 135–145)

## 2019-08-24 LAB — ETHANOL: Alcohol, Ethyl (B): <10 mg/dL (ref <10)

## 2019-08-24 SURGERY — CREATION, CRANIAL BURR HOLE
Anesthesia: General | Laterality: Left

## 2019-08-24 MED ORDER — BACITRACIN ZINC 500 UNIT/GM EX OINT
TOPICAL_OINTMENT | CUTANEOUS | Status: AC
  Filled 2019-08-24: qty 28.35

## 2019-08-24 MED ORDER — HALOPERIDOL LACTATE 5 MG/ML IJ SOLN: 0.5000 mg | INTRAMUSCULAR | Status: DC | PRN

## 2019-08-24 MED ORDER — ONDANSETRON 4 MG PO TBDP: 4.0000 mg | ORAL_TABLET | Freq: Four times a day (QID) | ORAL | Status: DC | PRN

## 2019-08-24 MED ORDER — ACETAMINOPHEN 325 MG PO TABS: 650.0000 mg | ORAL_TABLET | Freq: Four times a day (QID) | ORAL | Status: DC | PRN

## 2019-08-24 MED ORDER — ACETAMINOPHEN 325 MG PO TABS: 650.0000 mg | ORAL_TABLET | ORAL | Status: DC | PRN

## 2019-08-24 MED ORDER — THROMBIN 5000 UNITS EX SOLR
CUTANEOUS | Status: AC
  Filled 2019-08-24: qty 5000

## 2019-08-24 MED ORDER — PANTOPRAZOLE SODIUM 40 MG IV SOLR: 40.0000 mg | Freq: Every day | INTRAVENOUS | Status: DC

## 2019-08-24 MED ORDER — BIOTENE DRY MOUTH MT LIQD: 15.0000 mL | OROMUCOSAL | Status: DC | PRN

## 2019-08-24 MED ORDER — ORAL CARE MOUTH RINSE: 15.0000 mL | Freq: Two times a day (BID) | OROMUCOSAL | Status: DC

## 2019-08-24 MED ORDER — ACETAMINOPHEN 650 MG RE SUPP: 650.0000 mg | Freq: Four times a day (QID) | RECTAL | Status: DC | PRN

## 2019-08-24 MED ORDER — POLYVINYL ALCOHOL 1.4 % OP SOLN
1.0000 [drp] | Freq: Four times a day (QID) | OPHTHALMIC | Status: DC | PRN
  Filled 2019-08-24: qty 15

## 2019-08-24 MED ORDER — MORPHINE SULFATE (PF) 2 MG/ML IV SOLN
2.0000 mg | INTRAVENOUS | Status: DC | PRN
  Administered 2019-08-24: 16:00:00 2 mg via INTRAVENOUS
  Filled 2019-08-24: qty 1

## 2019-08-24 MED ORDER — ONDANSETRON HCL 4 MG/2ML IJ SOLN
INTRAMUSCULAR | Status: AC
  Administered 2019-08-24: 4 mg
  Filled 2019-08-24: qty 2

## 2019-08-24 MED ORDER — CLEVIDIPINE BUTYRATE 0.5 MG/ML IV EMUL
0.0000 mg/h | INTRAVENOUS | Status: DC
  Administered 2019-08-24: 1 mg/h via INTRAVENOUS
  Filled 2019-08-24: qty 50

## 2019-08-24 MED ORDER — SODIUM CHLORIDE 3 % IV SOLN
INTRAVENOUS | Status: DC
  Administered 2019-08-24: 12:00:00 75 mL/h via INTRAVENOUS
  Filled 2019-08-24 (×2): qty 500

## 2019-08-24 MED ORDER — ACETAMINOPHEN 650 MG RE SUPP: 650.0000 mg | RECTAL | Status: DC | PRN

## 2019-08-24 MED ORDER — STROKE: EARLY STAGES OF RECOVERY BOOK: Freq: Once | Status: DC

## 2019-08-24 MED ORDER — FENTANYL CITRATE (PF) 250 MCG/5ML IJ SOLN
INTRAMUSCULAR | Status: AC
  Filled 2019-08-24: qty 5

## 2019-08-24 MED ORDER — ACETAMINOPHEN 160 MG/5ML PO SOLN: 650.0000 mg | ORAL | Status: DC | PRN

## 2019-08-24 MED ORDER — HALOPERIDOL LACTATE 2 MG/ML PO CONC
0.5000 mg | ORAL | Status: DC | PRN
  Filled 2019-08-24: qty 0.3

## 2019-08-24 MED ORDER — ONDANSETRON HCL 4 MG/2ML IJ SOLN: 4.0000 mg | Freq: Four times a day (QID) | INTRAMUSCULAR | Status: DC | PRN

## 2019-08-24 MED ORDER — ONDANSETRON HCL 4 MG/2ML IJ SOLN: 4.0000 mg | Freq: Once | INTRAMUSCULAR | Status: DC

## 2019-08-24 MED ORDER — IOHEXOL 350 MG/ML SOLN
75.0000 mL | Freq: Once | INTRAVENOUS | Status: AC | PRN
  Administered 2019-08-24: 10:00:00 75 mL via INTRAVENOUS

## 2019-08-24 MED ORDER — LIDOCAINE-EPINEPHRINE 1 %-1:100000 IJ SOLN
INTRAMUSCULAR | Status: AC
  Filled 2019-08-24: qty 1

## 2019-08-24 MED ORDER — LABETALOL HCL 5 MG/ML IV SOLN
20.0000 mg | Freq: Once | INTRAVENOUS | Status: AC
  Administered 2019-08-24: 10:00:00 20 mg via INTRAVENOUS
  Filled 2019-08-24: qty 4

## 2019-08-24 MED ORDER — GLYCOPYRROLATE 1 MG PO TABS
1.0000 mg | ORAL_TABLET | ORAL | Status: DC | PRN
  Filled 2019-08-24: qty 1

## 2019-08-24 MED ORDER — ONDANSETRON HCL 4 MG/2ML IJ SOLN
4.0000 mg | Freq: Four times a day (QID) | INTRAMUSCULAR | Status: DC | PRN
  Filled 2019-08-24: qty 2

## 2019-08-24 MED ORDER — MIDAZOLAM HCL 2 MG/2ML IJ SOLN
INTRAMUSCULAR | Status: AC
  Filled 2019-08-24: qty 2

## 2019-08-24 MED ORDER — GLYCOPYRROLATE 0.2 MG/ML IJ SOLN
0.2000 mg | INTRAMUSCULAR | Status: DC | PRN
  Administered 2019-08-24: 16:00:00 0.2 mg via INTRAVENOUS
  Filled 2019-08-24: qty 1

## 2019-08-24 MED ORDER — HALOPERIDOL 0.5 MG PO TABS
0.5000 mg | ORAL_TABLET | ORAL | Status: DC | PRN
  Filled 2019-08-24: qty 1

## 2019-08-24 MED ORDER — SENNOSIDES-DOCUSATE SODIUM 8.6-50 MG PO TABS: 1.0000 | ORAL_TABLET | Freq: Two times a day (BID) | ORAL | Status: DC

## 2019-08-24 MED ORDER — GLYCOPYRROLATE 0.2 MG/ML IJ SOLN: 0.2000 mg | INTRAMUSCULAR | Status: DC | PRN
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
## 2019-08-27 NOTE — Consult Note (Signed)
Neurosurgery Consultation  Reason for Consult: Intracerebral hemorrhage Referring Physician: Rory Percy  CC: AMS  HPI: This is a 86 y.o. woman that presents after being noticed driving erratically. EMS found her with R facial droop and some dysarthria. When seen by neurology for code stroke, she was Ox1 and interactive with R sided weakness but still movement on the right side. Further history limited due to patient's altered mental status, review of her chart shows no known use of anticoagulants or antiplatelet agents. By report, she is very high functioning, takes care of her husband, was driving her dog to the vet this morning. Emesis x2 thus far despite ondansetron, currently getting a 3% bolus.   ROS: A 14 point ROS was performed and is negative except as noted in the HPI.   PMHx:  Past Medical History:  Diagnosis Date  . Anxiety   . Arthritis    Bilateral hands  . Cancer ([place])    breast hx of, skin hx of  . GERD (gastroesophageal reflux disease)   . Hyperlipidemia   . Nephrolithiasis     X1  . Osteopenia   . Positive H. pylori test 01/27/2017  . Tubular adenoma of colon 01/2013   FamHx:  Family History  Problem Relation Age of Onset  . Heart attack Mother 42  . Heart disease Mother   . Lung cancer Father        smoker  . COPD Father   . Cancer Father   . Stroke Sister 59  . Alzheimer's disease Sister   . Heart attack Brother 21  . Heart disease Brother   . Diabetes Neg Hx    SocHx:  reports that she has never smoked. She has never used smokeless tobacco. She reports that she does not drink alcohol or use drugs.  Exam: Vital signs in last 24 hours: Temp:  [97 F (36.1 C)] 97 F (36.1 C) (02/26 1028) Pulse Rate:  [71-84] 74 (02/26 1040) Resp:  [13-18] 16 (02/26 1115) BP: (113-157)/(55-102) 123/71 (02/26 1115) SpO2:  [96 %-100 %] 96 % (02/26 1040) Weight:  [44 kg] 44 kg (02/26 1029) General: Obtunded, only responding to family, lying in bed, appears acutely  ill Head: normocephalic and atruamatic HEENT: neck supple Pulmonary: breathing room air comfortably, no evidence of increased work of breathing Cardiac: RRR Abdomen: S NT ND Extremities: warm and well perfused x4 Neuro: Obtunded, eyes open to pain and mumbling in response, Oxzero, pupils 12mm OS 53mm OD and reactive, gaze conjugate RLE externally rotated, R sided minimal response to pain, L side localizing   Assessment and Plan: 86 y.o. woman with acute AMS. [place] personally reviewed, which shows large left parieto-occipital ICH with 66mm of MLS, pattern c/w CAA.   -discussed w/ pt's family at bedside, they think the patient would want to pursue aggressive Tx, discussed Tx options, given her high functional status and rapid progression while in the ED along with the MLS, I recommend decompression of the hematoma as a life-saving measure, discussed that it won't reverse focal deficits and that, given her age and CAA, very high risk of perioperative complications especially post-op re-hemorrhage. They would like to proceed, will take to the OR now for burr hole / stereotactic hematoma evac  Judith Part, MD 2019-08-26 11:21 AM [facility]

## 2019-08-27 NOTE — Anesthesia Preprocedure Evaluation (Deleted)
Anesthesia Evaluation  Patient identified by MRN, date of birth, ID band Patient awake  General Assessment Comment:Per surgeon: 86 y.o. woman with acute AMS. [place] personally reviewed, which shows large left parieto-occipital ICH with 46mm of MLS, pattern c/w CAA  Reviewed: Allergy & Precautions, NPO status , Patient's Chart, lab work & pertinent test results, Unable to perform ROS - Chart review onlyPreop documentation limited or incomplete due to emergent nature of procedure.  Airway        Dental   Pulmonary neg pulmonary ROS,           Cardiovascular negative cardio ROS       Neuro/Psych PSYCHIATRIC DISORDERS Anxiety Intracerebral hemorrhage negative neurological ROS     GI/Hepatic Neg liver ROS, GERD  Medicated,  Endo/Other  negative endocrine ROS  Renal/GU Renal disease     Musculoskeletal negative musculoskeletal ROS (+)   Abdominal   Peds  Hematology negative hematology ROS (+)   Anesthesia Other Findings Day of surgery medications reviewed with the patient.  Reproductive/Obstetrics                             Anesthesia Physical Anesthesia Plan  ASA: V and emergent  Anesthesia Plan: General   Post-op Pain Management:    Induction: Intravenous  PONV Risk Score and Plan: 3 and Treatment may vary due to age or medical condition  Airway Management Planned: Oral ETT  Additional Equipment: Arterial line  Intra-op Plan:   Post-operative Plan: Post-operative intubation/ventilation  Informed Consent:     Only emergency history available  Plan Discussed with:   Anesthesia Plan Comments:         Anesthesia Quick Evaluation

## 2019-08-27 NOTE — Progress Notes (Signed)
Patient expired around 2130 and conformed by RN Isidoro Donning with pt's son and daughter-in-law at bedside.  On call Neurology, Dr. Bruce Donath was made aware and will sign the Death Certificate.  [facility] was likewise made aware and [facility] staff Art Buff provided referral no. [phone] and patient not a potential candidate.  Awaiting for family members to leave before preparing body to be sent to morgue.

## 2019-08-27 NOTE — Death Summary Note (Signed)
Death Summary    Patient ID: Whitney Donaldson       MRN: [id number]        DOB: 09-03-1932  Date of Admission: 2019-09-09 Date of Death: September 09, 2019  Attending Physician: Amie Portland, MD Consultant(s):   Dr. Venetia Constable, neurosurgery Patient's PCP:  Lauree Chandler, NP  DIAGNOSIS: Intracerebral hemorrhage Active Problems:   ICH (intracerebral hemorrhage) [facility]) Hypertensive emergency Suspect underlying cerebral amyloid angiopathy Cerebral edema Brain herniation  Past Medical History:  Diagnosis Date  . Anxiety   . Arthritis    Bilateral hands  . Cancer ([place])    breast hx of, skin hx of  . GERD (gastroesophageal reflux disease)   . Hyperlipidemia   . Nephrolithiasis     X1  . Osteopenia   . Positive H. pylori test 01/27/2017  . Tubular adenoma of colon 01/2013   Past Surgical History:  Procedure Laterality Date  . COLONOSCOPY  2004    negative; Dr Fuller Plan  . colonoscopy with polypectomy  2014  . mastestomy  2003   partial /left  . SKIN CANCER EXCISION     chest , Dr Ubaldo Glassing    Family History Family History  Problem Relation Age of Onset  . Heart attack Mother 46  . Heart disease Mother   . Lung cancer Father        smoker  . COPD Father   . Cancer Father   . Stroke Sister 43  . Alzheimer's disease Sister   . Heart attack Brother 3  . Heart disease Brother   . Diabetes Neg Hx     Social History  reports that she has never smoked. She has never used smokeless tobacco. She reports that she does not drink alcohol or use drugs.  HOME MEDICATIONS PRIOR TO ADMISSION . aspirin 81 MG tablet Take 81 mg by mouth daily.      . Calcium Carbonate-Vitamin D (CALCIUM + D PO) Take by mouth daily.     . Cholecalciferol (VITAMIN D3) 1000 UNITS CAPS Take by mouth 2 (two) times daily.     . Coenzyme Q10 (COQ10 PO) Take 1 tablet by mouth daily.    . Folic Acid (FOLATE PO) Take 1 tablet by mouth daily.    . Multiple Vitamin (MULTIVITAMIN) tablet Take 1 tablet  by mouth daily.      . Multiple Vitamins-Minerals (VISION-VITE PRESERVE PO) Take by mouth.    . pantoprazole (PROTONIX) 40 MG tablet Take 1 tablet by mouth once daily 30 tablet 5  . rosuvastatin (CRESTOR) 20 MG tablet TAKE 1 TABLET BY MOUTH AT BEDTIME 90 tablet 1  . sertraline (ZOLOFT) 25 MG tablet Take 1 tablet (25 mg total) by mouth daily. 90 tablet 1  . vitamin B-12 (CYANOCOBALAMIN) 1000 MCG tablet Take 1,000 mcg by mouth daily.    . vitamin C (ASCORBIC ACID) 250 MG tablet Take 500 mg by mouth daily.     . Vitamin E 400 UNITS TABS Take by mouth daily.      LABORATORY STUDIES CBC    Component Value Date/Time   WBC 8.2 09/09/2019 0957   RBC 4.73 Sep 09, 2019 0957   HGB 15.6 (H) 2019/09/09 1000   HGB 14.1 04/24/2019 0938   HGB 13.6 12/17/2016 0819   HGB 13.3 12/11/2007 0803   HCT 46.0 September 09, 2019 1000   HCT 41.5 12/17/2016 0819   HCT 38.6 12/11/2007 0803   PLT 151 09/09/2019 0957   PLT 165 04/24/2019 0938   PLT 191 12/17/2016 0819  PLT 217 12/11/2007 0803   MCV 99.2 09/04/19 0957   MCV 95 12/17/2016 0819   MCV 91.4 12/11/2007 0803   MCH 31.9 09/04/2019 0957   MCHC 32.2 09-04-2019 0957   RDW 12.0 2019/09/04 0957   RDW 12.4 12/17/2016 0819   RDW 11.3 12/11/2007 0803   LYMPHSABS 1.0 2019-09-04 0957   LYMPHSABS 0.9 12/17/2016 0819   LYMPHSABS 1.4 12/11/2007 0803   MONOABS 0.5 09-04-19 0957   MONOABS 0.5 12/11/2007 0803   EOSABS 0.0 Sep 04, 2019 0957   EOSABS 0.0 12/17/2016 0819   BASOSABS 0.0 September 04, 2019 0957   BASOSABS 0.0 12/17/2016 0819   BASOSABS 0.0 12/11/2007 0803   CMP    Component Value Date/Time   NA 138 2019-09-04 1115   NA 142 12/17/2016 0819   NA 142 12/18/2015 0838   K 3.8 04-Sep-2019 1000   K 4.3 12/17/2016 0819   K 4.0 12/18/2015 0838   CL 105 Sep 04, 2019 1000   CL 103 12/17/2016 0819   CO2 26 09/04/2019 0957   CO2 29 12/17/2016 0819   CO2 29 12/18/2015 0838   GLUCOSE 142 (H) 2019-09-04 1000   GLUCOSE 97 12/17/2016 0819   BUN 19  September 04, 2019 1000   BUN 14 12/17/2016 0819   BUN 20.9 12/18/2015 0838   CREATININE 0.50 September 04, 2019 1000   CREATININE 0.68 07/04/2019 1049   CREATININE 0.8 12/18/2015 0838   CALCIUM 8.8 (L) 09/04/19 0957   CALCIUM 9.6 12/17/2016 0819   CALCIUM 9.3 12/18/2015 0838   PROT 7.0 Sep 04, 2019 0957   PROT 7.4 12/17/2016 0819   PROT 7.0 12/18/2015 0838   ALBUMIN 4.4 2019/09/04 0957   ALBUMIN 3.9 12/17/2016 0819   ALBUMIN 4.1 12/18/2015 0838   AST 32 2019-09-04 0957   AST 20 04/24/2019 0938   AST 18 12/18/2015 0838   ALT 27 09/04/19 0957   ALT 18 04/24/2019 0938   ALT 30 12/17/2016 0819   ALT 22 12/18/2015 0838   ALKPHOS 54 Sep 04, 2019 0957   ALKPHOS 116 (H) 12/17/2016 0819   ALKPHOS 56 12/18/2015 0838   BILITOT 0.6 09/04/19 0957   BILITOT 0.5 04/24/2019 0938   BILITOT 0.60 12/18/2015 0838   GFRNONAA >60 09/04/2019 0957   GFRNONAA 80 07/04/2019 1049   GFRAA >60 09-04-19 0957   GFRAA 93 07/04/2019 1049   COAGS Lab Results  Component Value Date   INR 0.9 September 04, 2019   Lipid Panel    Component Value Date/Time   CHOL 190 2019-09-04 1115   CHOL 201 (H) 12/05/2014 1037   TRIG 53 September 04, 2019 1115   TRIG 79 12/05/2014 1037   HDL 74 2019-09-04 1115   HDL 81 12/05/2014 1037   CHOLHDL 2.6 Sep 04, 2019 1115   VLDL 11 09/04/19 1115   LDLCALC 105 (H) Sep 04, 2019 1115   LDLCALC 115 (H) 07/04/2019 1049   LDLCALC 104 (H) 12/05/2014 1037   HgbA1C  Lab Results  Component Value Date   HGBA1C 5.9 (H) 2019-09-04   Urinalysis    Component Value Date/Time   COLORURINE YELLOW 01/19/2016 1108   APPEARANCEUR CLEAR 01/19/2016 1108   LABSPEC 1.025 01/19/2016 1108   PHURINE 6.0 01/19/2016 1108   GLUCOSEU NEGATIVE 01/19/2016 1108   HGBUR NEGATIVE 01/19/2016 1108   BILIRUBINUR negative 01/29/2016 1017   BILIRUBINUR Neg 02/01/2013 1643   KETONESUR trace (5) (A) 01/29/2016 1017   KETONESUR NEGATIVE 01/19/2016 1108   PROTEINUR =30 (A) 01/29/2016 1017   PROTEINUR Trace 02/01/2013 1643    UROBILINOGEN 0.2 01/29/2016 1017   UROBILINOGEN 0.2  01/19/2016 1108   NITRITE Negative 01/29/2016 1017   NITRITE NEGATIVE 01/19/2016 1108   LEUKOCYTESUR Negative 01/29/2016 1017   Urine Drug Screen No results found for: LABOPIA, COCAINSCRNUR, LABBENZ, AMPHETMU, THCU, LABBARB  Alcohol Level    Component Value Date/Time   ETH <10 09/22/2019 0957     SIGNIFICANT DIAGNOSTIC STUDIES CT Code Stroke CTA Head W/WO contrast  Result Date: 09/22/19 CLINICAL DATA:  Code stroke follow-up, intracranial hemorrhage EXAM: CT ANGIOGRAPHY HEAD AND NECK TECHNIQUE: Multidetector CT imaging of the head and neck was performed using the standard protocol during bolus administration of intravenous contrast. Multiplanar CT image reconstructions and MIPs were obtained to evaluate the vascular anatomy. Carotid stenosis measurements (when applicable) are obtained utilizing NASCET criteria, using the distal internal carotid diameter as the denominator. CONTRAST:  35mL OMNIPAQUE IOHEXOL 350 MG/ML SOLN COMPARISON:  Correlation made with same day CT head FINDINGS: CTA NECK FINDINGS Aortic arch: Great vessel origins are patent with mild calcified plaque. Right carotid system: Patent. Calcified plaque at the bifurcation and along the proximal ICA causes minimal stenosis. Left carotid system: Patent. Calcified plaque at the bifurcation and along the proximal ICA causes minimal stenosis. Vertebral arteries: Patent. Left vertebral artery is dominant. Mild calcified plaque at the right vertebral origin. Skeleton: Multilevel degenerative changes of the cervical spine. Other neck: Multinodular thyroid.  No neck mass or adenopathy. Upper chest: Scarring at the lung apices. Review of the MIP images confirms the above findings CTA HEAD FINDINGS Anterior circulation: Intracranial internal carotid arteries are patent. There is calcified plaque along the cavernous and paraclinoid portions without significant stenosis. Middle and anterior  cerebral arteries are patent. The right A1 ACA is dominant. Posterior circulation: Intracranial vertebral arteries, basilar artery, and posterior cerebral arteries are patent. A left posterior communicating artery is present. Venous sinuses: As permitted by contrast timing, patent. Hypoplastic left transverse sinus. There is no abnormal vascularity or enhancement in the region of hemorrhage. Review of the MIP images confirms the above findings IMPRESSION: No abnormal vascularity or enhancement in the region of hemorrhage. No hemodynamically significant stenosis in the neck Electronically Signed   By: Macy Mis M.D.   On: 09-22-19 10:33   CT Code Stroke CTA Neck W/WO contrast  Result Date: 22-Sep-2019 CLINICAL DATA:  Code stroke follow-up, intracranial hemorrhage EXAM: CT ANGIOGRAPHY HEAD AND NECK TECHNIQUE: Multidetector CT imaging of the head and neck was performed using the standard protocol during bolus administration of intravenous contrast. Multiplanar CT image reconstructions and MIPs were obtained to evaluate the vascular anatomy. Carotid stenosis measurements (when applicable) are obtained utilizing NASCET criteria, using the distal internal carotid diameter as the denominator. CONTRAST:  42mL OMNIPAQUE IOHEXOL 350 MG/ML SOLN COMPARISON:  Correlation made with same day CT head FINDINGS: CTA NECK FINDINGS Aortic arch: Great vessel origins are patent with mild calcified plaque. Right carotid system: Patent. Calcified plaque at the bifurcation and along the proximal ICA causes minimal stenosis. Left carotid system: Patent. Calcified plaque at the bifurcation and along the proximal ICA causes minimal stenosis. Vertebral arteries: Patent. Left vertebral artery is dominant. Mild calcified plaque at the right vertebral origin. Skeleton: Multilevel degenerative changes of the cervical spine. Other neck: Multinodular thyroid.  No neck mass or adenopathy. Upper chest: Scarring at the lung apices. Review of  the MIP images confirms the above findings CTA HEAD FINDINGS Anterior circulation: Intracranial internal carotid arteries are patent. There is calcified plaque along the cavernous and paraclinoid portions without significant stenosis. Middle and anterior cerebral arteries  are patent. The right A1 ACA is dominant. Posterior circulation: Intracranial vertebral arteries, basilar artery, and posterior cerebral arteries are patent. A left posterior communicating artery is present. Venous sinuses: As permitted by contrast timing, patent. Hypoplastic left transverse sinus. There is no abnormal vascularity or enhancement in the region of hemorrhage. Review of the MIP images confirms the above findings IMPRESSION: No abnormal vascularity or enhancement in the region of hemorrhage. No hemodynamically significant stenosis in the neck Electronically Signed   By: Macy Mis M.D.   On: 2019-09-19 10:33   DG SWALLOW FUNC OP MEDICARE SPEECH PATH  Result Date: 07/19/2019 CLINICAL DATA:  Dysphagia, unspecified type. Additional history provided: Possible aspiration on recent fluoroscopic esophagram. EXAM: MODIFIED BARIUM SWALLOW TECHNIQUE: Different consistencies of barium were administered orally to the patient by the Speech Pathologist. Imaging of the pharynx was performed in the lateral projection. The radiologist was present in the fluoroscopy room for this study, providing personal supervision. FLUOROSCOPY TIME:  Fluoroscopy Time:  1 minutes, 28 seconds COMPARISON:  Esophagram 07/11/2019 FINDINGS: Different consistencies of barium were administered orally to the patient by the Speech Pathologist with fluoroscopic imaging of the pharynx from a lateral projection. The radiologist was present in the fluoroscopy room for this study, providing personal supervision. The Speech Pathologist did not observe aspiration. Please refer to the Speech Pathologist's report for full details. IMPRESSION: Modified barium swallow performed  in conjunction with speech pathology. Please refer to the Speech Pathologists report for complete details and recommendations. Electronically Signed   By: Kellie Simmering DO   On: 07/19/2019 11:22 Objective Swallowing Evaluation: Type of Study: MBS-Modified Barium Swallow Study  Patient Details Name: GERILYN LINLEY MRN: [id number] Date of Birth: 1933/02/05 Today's Date: 07/19/2019 Time: SLP Start Time (ACUTE ONLY): 1100 -SLP Stop Time (ACUTE ONLY): 1130 SLP Time Calculation (min) (ACUTE ONLY): 30 min Past Medical History: Past Medical History: Diagnosis Date . Anxiety  . Arthritis   Bilateral hands . Cancer ([place])   breast hx of, skin hx of . GERD (gastroesophageal reflux disease)  . Hyperlipidemia  . Nephrolithiasis    X1 . Osteopenia  . Positive H. pylori test 01/27/2017 . Tubular adenoma of colon 01/2013 Past Surgical History: Past Surgical History: Procedure Laterality Date . COLONOSCOPY  2004   negative; Dr Fuller Plan . colonoscopy with polypectomy  2014 . mastestomy  2003  partial /left . SKIN CANCER EXCISION    chest , Dr Ubaldo Glassing HPI: 86yo female referred for outpatient MBS after esophagram 07/11/19 revealed aspiration of thin liquid via straw when taking barium tablet. PMH: breast cancer s/p radiation, PNA.  Subjective: Pt seen in radiology for Outpatient MBS Assessment / Plan / Recommendation CHL IP CLINICAL IMPRESSIONS 07/19/2019 Clinical Impression Pt was seen for outpatient MBS following aspiration during the swallow of water when taking a barium tablet during a recent esophagram. Pt presents with normal oral swallow. Pharyngeal swallow is mildly impaired, but within functional limits for pt age. Trigger of swallow reflex was timely. Reduced tongue base retraction was slightly reduced, and resulted in mild vallecular sinus residue after the swallow. Dry swallow reduced vallecular residue. Flash penetration was seen in trace amounts during large and/or consecutive boluses of thin liquid via cup and straw. No  penetration of puree or solid textures was seen, and penetration was prevented with individual sips of thin liquid. No aspiration was seen on any consistency. Recommend continuing with regular solids, thin liquids. Meds may be taken whole with cup sips of liquid, or with puree  textures. Pt voiced concern about "doing something wrong with breathing and swallowing", so SLP provided written information regarding respiration and swallowing. This information was reviewed with pt, and she was able to have questions answered. No further ST intervention is recommended at this time. Please reconsult if needs arise in the future.  SLP Visit Diagnosis Dysphagia, pharyngeal phase (R13.13)     Impact on safety and function Mild aspiration risk   CHL IP TREATMENT RECOMMENDATION 07/19/2019 Treatment Recommendations No treatment recommended at this time   Prognosis 07/19/2019 Prognosis for Safe Diet Advancement Good     CHL IP DIET RECOMMENDATION 07/19/2019 SLP Diet Recommendations Regular solids;Thin liquid Liquid Administration via Cup;Straw Medication Administration Whole meds with liquid Compensations Minimize environmental distractions;Slow rate;Small sips/bites Postural Changes Seated upright at 90 degrees   CHL IP OTHER RECOMMENDATIONS 07/19/2019   Oral Care Recommendations Oral care BID      CHL IP ORAL PHASE 07/19/2019 Oral Phase WFL    CHL IP PHARYNGEAL PHASE 07/19/2019 Pharyngeal Phase Impaired   Pharyngeal- Thin Cup Reduced airway/laryngeal closure;Reduced tongue base retraction;Penetration/Apiration after swallow;Pharyngeal residue - valleculae Pharyngeal Material does not enter airway;Material enters airway, remains ABOVE vocal cords then ejected out   Pharyngeal- Puree Reduced tongue base retraction;Pharyngeal residue - valleculae   Pharyngeal- Regular Reduced tongue base retraction;Pharyngeal residue - valleculae   CHL IP CERVICAL ESOPHAGEAL PHASE 07/19/2019 Cervical Esophageal Phase [facility] Celia B. Quentin Ore, [facility], [place]  Speech Language Pathologist Office: [phone] Pager: [phone] Shonna Chock 07/19/2019, 1:39 PM              CT HEAD CODE STROKE WO CONTRAST  Result Date: 2019-09-11 CLINICAL DATA:  Code stroke.  Focal neuro deficit. EXAM: CT HEAD WITHOUT CONTRAST TECHNIQUE: Contiguous axial images were obtained from the base of the skull through the vertex without intravenous contrast. COMPARISON:  CT head 06/16/2008 FINDINGS: Brain: Large hemorrhage in the left parietal lobe measuring 8.5 x 4.7 by 4.4 cm. This has high density with mild surrounding edema. Blood volume is approximately 85 mL of blood. There is mass-effect and 10 mm midline shift to the right small amount of hemorrhage in the left thalamus. Negative for hydrocephalus. No acute infarct or neoplasm identified. Vascular: Negative for hyperdense vessel Skull: Negative Sinuses/Orbits: Mucosal edema paranasal sinuses. Bilateral cataract surgery. Other: None ASPECTS ([place] Stroke Program Early CT Score) Not applicable due to acute hemorrhage. IMPRESSION: 1. Large parenchymal hematoma left parietal lobe. Blood volume 85 mL. 10 mm midline shift to the right. 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 Other considerations include hypertension and vascular malformation. 2. These results were called by telephone at the time of interpretation on September 11, 2019 at 10:11 am to provider Rory Percy , who verbally acknowledged these results. Electronically Signed   By: Franchot Gallo M.D.   On: 11-Sep-2019 10:12   DG ESOPHAGUS W DOUBLE CM (HD)  Result Date: 07/11/2019 CLINICAL DATA:  Patient has choking and coughing while eating. Dysphagia. EXAM: ESOPHOGRAM / BARIUM SWALLOW / BARIUM TABLET STUDY TECHNIQUE: Combined double contrast and single contrast examination performed using effervescent crystals, thick barium liquid, and thin barium liquid. The patient was observed with fluoroscopy swallowing a 13 mm barium sulphate tablet. FLUOROSCOPY TIME:  Fluoroscopy Time:  1 minutes  and 48 seconds Radiation Exposure Index (if provided by the fluoroscopic device): 7.10 mGy. Number of Acquired Spot Images: 1 series.  Total of 53 images. COMPARISON:  None. FINDINGS: Pharyngeal swallowing function unremarkable except when attempting to swallow the barium tablet, utilizing a straw, during  which the patient had some aspiration of the water. In the upper esophagus just below the hypopharynx, there is an apparent filling defect, difficult to define on the upper GI study. This could reflect fibrovascular polyp. Esophagus is otherwise normal in course with no other evidence of a mucosal abnormality. No evidence of inflammation. There is mild narrowing at the gastroesophageal junction. Narrowing is smooth, but allows passage of a 13 mm barium tablet without holdup. No reflux was documented during the exam. Motility is normal for age. IMPRESSION: 1. Possible filling defect in the cervical esophagus, which may reflect a fibrovascular polyp. Recommend follow-up endoscopy. 2. Single episode of aspiration when attempting to swallow the barium tablet utilizing a straw with water. If this is a consistent problem for this patient, swallowing function assessment with speech pathology should be considered. 3. Mild narrowing at the gastroesophageal junction, which may be from limited relaxation of the lower gastroesophageal sphincter versus a mild benign stricture. This does not cause holdup to passage of a 13 mm barium tablet. 4. No other abnormalities. No reflux documented and no evidence of esophagitis. Electronically Signed   By: Lajean Manes M.D.   On: 07/11/2019 09:29      HISTORY OF PRESENT ILLNESS ILLIANA KHOSHABA is an 86 y.o. female  Breast cancer, HLD who presented to [facility] as a code stroke for c/o slurred speech and right facial droop.   per son he spoke to her last night at 2000 and she was completely normal no slurring. This morning patient was driving her dog to the vet. On the way she was  swerving so bad that several people called the police to report her driving. When she arrived at the vet EMS was called. She was found to have slurred speech, right facial droop and she was brought to the [facility] ED as a code stroke. Patient did vomit and received 4 of zofran. Initial BP was 220/110. Confirmed DNR status with SON Chris.  Patient normally lives at home with her husband who has alzheimer's. 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 Able to take care of her ADL's independently and her bills. Still able to drive. No prior stroke history.   ED course:  CTH:  Large parenchymal hematoma left parietal lobe. Volume 43ml 74mm midline shift to the right.  CTA: no LVO  BG: 151 BP: 146/92  Date last known well: 08/23/19 Time last known well:2000 tPA Given:no, bleed Modified Rankin: Rankin Score=1 NIHSS: 20  Intracerebral Hemorrhage (ICH) Score 3 - 72% 30-day mortality   HOSPITAL COURSE In the hospital, she was evaluated as an acute code stroke.  She had exam suggestive of left cerebral hemispheric dysfunction with aphasia, right hemiparesis, left gaze preference and right-sided visual field cut.  She was taken for a stat CT head that showed a large intracerebral hemorrhage in the left cerebral hemisphere.  Neurosurgery was consulted emergently for possible evacuation.  This was discussed with the son who at the time wanted the surgery done.  As the surgery team was getting prepared for surgery, the patient's mentation started to decline, she had an enlarged pupil on the left side, she was more drowsy and weaker on the right side.  This was updated to the son and further discussion held when he and his wife, both said that the patient would not want to live with deficits that would be expected after a large left hemispheric bleed and based on her wishes transition her to comfort measures only. Comfort measures order  set was initiated.  Patient was transferred to the floor. She was pronounced dead around  9:30 PM on the same day.    25 minutes were spent preparing discharge.  -- Amie Portland, MD [facility] Pager: [phone] If 7pm to 7am, please call on call as listed on AMION.

## 2019-08-27 NOTE — Progress Notes (Signed)
Pt to 6N26 via bed from 4N, comfort care.  Pt appears very comfortable.

## 2019-08-27 NOTE — Code Documentation (Signed)
OR Called. Ready for patient. Patient placed on transport monitor and MD Rory Percy at the bedside speaking with family. Family questioning moving forward with procedure. Spoke with MD and requested that the patient move forward as medical management without going to surgery. OR made aware. MD Rory Percy to call MD [place]. Plan of Care: t to be admitted to [facility] ICU with Cleviprex and 3% Saline going.

## 2019-08-27 NOTE — Code Documentation (Signed)
86 yo female coming from [employer] where she was noted to be abnormal from staff. She was noted to be swerving on the way to the office and bystanders called 911 to report her. Police showed up to the [place] office and the same time she got out of her car to try and take her dog inside. Pt was noted to have abnormal speech, weakness (R>L), and slurring of her words.   EMS called and activated a Code Stroke. Stroke Team met patient upon arrival to the ED. EDP cleared airway and Labs drawn. Taken to CT. Initial NIHSS 20 - right facial droop, right field cut, left gaze, right arm weakness, right leg weakness, aphasia, dysarthria, and neglect.   CT Head revealed hemorrhage. BP noted 134/80. CTA completed. Returned to ED. Placed on cardiac monitor. BP noted to be 150/57. Labetalol 20 mg given. Cleviprex started at 29m/hr. Titrated per protocol.   Plan of Care: Neurosurgery consulted. BP < 140. Q1 mNIHSS, VS and pupil checks.

## 2019-08-27 NOTE — Progress Notes (Signed)
Family at bedside with pt, who appears very relaxed and peaceful.  Comfort cart and snacks provided for family.

## 2019-08-27 NOTE — Progress Notes (Signed)
Same day progress note  Subjective: Patient more somnolent.  Also breathing mildly faster.  Objective Does not open eyes to command Spontaneously moving the left side but no movement on the right Left pupil is 4 mm reactive, right pupil is 1 mm reactive. Right facial weakness noted Minimal withdrawal to noxious immolation on the right.  Assessment and recommendations: Large left cerebral intracerebral hemorrhage.  Spoke to the son in detail and explained that this might be the progression of the bleed and cerebral edema and herniation.  At this time, in the lack of any surgical intervention, it might be in the patient's best interest to have comfort measures only implemented. The son is agreeable and understanding.  He is accompanied by his wife at the bedside. At this time we will initiate comfort measures only. I will also per transfer orders to a palliative floor.  -- Amie Portland, MD [facility] Pager: [phone] If 7pm to 7am, please call on call as listed on AMION.

## 2019-08-27 NOTE — Code Documentation (Signed)
OR called. Reported they just received the posting and will call back with where to bring the patient.
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
## 2019-08-27 NOTE — ED Provider Notes (Signed)
[facility] EMERGENCY DEPARTMENT Provider Note   CSN: [id number] Arrival date & time: 2019/09/11  6384  An emergency department physician performed an initial assessment on this suspected stroke patient at 0954.  History Chief Complaint  Patient presents with  . Code Stroke    Whitney Donaldson is a 86 y.o. female.  Pt presents to the ED today as a code stroke.  Pt's son spoke with pt around 2000 last night and she was nl.  She is normally very active and cares for her husband who has Alzheimer's.  She woke up this am and drove her dog to the vet.  Multiple people called the police as she was swerving and driving erratically.  When pt arrived at the vet's office, she was slurring her speech and had a right sided facial droop.  The police pulled up at the same time and let the vet know about the driving.  EMS was called and they activated the code stroke.  She is not on blood thinners other than ASA 81 mg daily.  She is not able to contribute to the hx.        Past Medical History:  Diagnosis Date  . Anxiety   . Arthritis    Bilateral hands  . Cancer ([place])    breast hx of, skin hx of  . GERD (gastroesophageal reflux disease)   . Hyperlipidemia   . Nephrolithiasis     X1  . Osteopenia   . Positive H. pylori test 01/27/2017  . Tubular adenoma of colon 01/2013    Patient Active Problem List   Diagnosis Date Noted  . ICH (intracerebral hemorrhage) ([place]) 09-11-2019  . Osteoporosis 06/23/2018  . Estrogen deficiency 01/25/2017  . Weight loss 01/25/2017  . Stomach pain 01/19/2016  . Routine general medical examination at a health care facility 12/08/2015  . Nonspecific abnormal electrocardiogram (ECG) (EKG) 12/05/2014  . Osteopenia 07/31/2009  . Anxiety state 07/04/2008  . Hyperlipemia 04/04/2008  . Nocturia 04/04/2008    Past Surgical History:  Procedure Laterality Date  . COLONOSCOPY  2004    negative; Dr Fuller Plan  . colonoscopy with polypectomy  2014    . mastestomy  2003   partial /left  . SKIN CANCER EXCISION     chest , Dr Ubaldo Glassing     OB History   No obstetric history on file.     Family History  Problem Relation Age of Onset  . Heart attack Mother 96  . Heart disease Mother   . Lung cancer Father        smoker  . COPD Father   . Cancer Father   . Stroke Sister 34  . Alzheimer's disease Sister   . Heart attack Brother 4  . Heart disease Brother   . Diabetes Neg Hx     Social History   Tobacco Use  . Smoking status: Never Smoker  . Smokeless tobacco: Never Used  . Tobacco comment: NEVER USED TOBACCO  Substance Use Topics  . Alcohol use: No    Alcohol/week: 0.0 standard drinks  . Drug use: No    Home Medications Prior to Admission medications   Medication Sig Start Date End Date Taking? Authorizing Provider  aspirin 81 MG tablet Take 81 mg by mouth daily.      [provider]  Calcium Carbonate-Vitamin D (CALCIUM + D PO) Take by mouth daily.     [provider]  Cholecalciferol (VITAMIN D3) 1000 UNITS CAPS Take by  mouth 2 (two) times daily.     [provider]  Coenzyme Q10 (COQ10 PO) Take 1 tablet by mouth daily.    [provider]  Folic Acid (FOLATE PO) Take 1 tablet by mouth daily.    [provider]  Multiple Vitamin (MULTIVITAMIN) tablet Take 1 tablet by mouth daily.      [provider]  Multiple Vitamins-Minerals (VISION-VITE PRESERVE PO) Take by mouth.    [provider]  pantoprazole (PROTONIX) 40 MG tablet Take 1 tablet by mouth once daily 08/01/19   Lauree Chandler, NP  rosuvastatin (CRESTOR) 20 MG tablet TAKE 1 TABLET BY MOUTH AT BEDTIME 07/26/19   Lauree Chandler, NP  sertraline (ZOLOFT) 25 MG tablet Take 1 tablet (25 mg total) by mouth daily. 08/07/19   Lauree Chandler, NP  vitamin B-12 (CYANOCOBALAMIN) 1000 MCG tablet Take 1,000 mcg by mouth daily.    [provider]  vitamin C (ASCORBIC ACID) 250 MG tablet Take 500 mg by  mouth daily.     [provider]  Vitamin E 400 UNITS TABS Take by mouth daily.    [provider]    Allergies    Fosamax [alendronate sodium], Oxycodone-acetaminophen, and Tramadol  Review of Systems   Review of Systems  Unable to perform ROS: Mental status change    Physical Exam Updated Vital Signs BP 117/68   Pulse 74   Temp (!) 97 F (36.1 C) (Axillary)   Resp 15   Wt 44 kg   SpO2 96%   BMI 17.74 kg/m   Physical Exam Vitals and nursing note reviewed.  HENT:     Head: Atraumatic.     Comments: Right sided facial droop    Right Ear: External ear normal.     Left Ear: External ear normal.     Nose: Nose normal.     Mouth/Throat:     Pharynx: Oropharynx is clear.  Eyes:     Conjunctiva/sclera: Conjunctivae normal.     Pupils: Pupils are equal, round, and reactive to light.  Cardiovascular:     Rate and Rhythm: Normal rate and regular rhythm.     Pulses: Normal pulses.     Heart sounds: Normal heart sounds.  Pulmonary:     Effort: Pulmonary effort is normal.     Breath sounds: Normal breath sounds.  Abdominal:     General: Abdomen is flat. Bowel sounds are normal.     Palpations: Abdomen is soft.  Musculoskeletal:        General: Normal range of motion.     Cervical back: Normal range of motion and neck supple.  Skin:    General: Skin is warm.     Capillary Refill: Capillary refill takes less than 2 seconds.  Neurological:     Mental Status: She is alert.     Comments: Right facial droop, right sided neglect, right sided weakness, speech slurred  Psychiatric:        Mood and Affect: Mood normal.        Behavior: Behavior normal.     ED Results / Procedures / Treatments   Labs (all labs ordered are listed, but only abnormal results are displayed) Labs Reviewed  APTT - Abnormal; Notable for the following components:      Result Value   aPTT 23 (*)    All other components within normal limits  CBC - Abnormal; Notable for the  following components:   Hemoglobin 15.1 (*)  HCT 46.9 (*)    All other components within normal limits  I-STAT CHEM 8, ED - Abnormal; Notable for the following components:   Glucose, Bld 142 (*)    Calcium, Ion 1.04 (*)    Hemoglobin 15.6 (*)    All other components within normal limits  CBG MONITORING, ED - Abnormal; Notable for the following components:   Glucose-Capillary 145 (*)    All other components within normal limits  RESPIRATORY PANEL BY RT PCR (FLU A&B, COVID)  PROTIME-INR  DIFFERENTIAL  ETHANOL  COMPREHENSIVE METABOLIC PANEL  RAPID URINE DRUG SCREEN, HOSP PERFORMED  URINALYSIS, ROUTINE W REFLEX MICROSCOPIC  HEMOGLOBIN A1C  LIPID PANEL  SODIUM  SODIUM  SODIUM    EKG EKG Interpretation  Date/Time:  10-Sep-2019 10:21:30 EST Ventricular Rate:  84 PR Interval:    QRS Duration: 96 QT Interval:  424 QTC Calculation: 502 R Axis:   36 Text Interpretation: Sinus rhythm Atrial premature complexes Abnormal R-wave progression, early transition Borderline ST depression, diffuse leads Prolonged QT interval Confirmed by Isla Pence [phone]) on September 10, 2019 10:55:04 AM   Radiology CT Code Stroke CTA Head W/WO contrast  Result Date: 09-10-2019 CLINICAL DATA:  Code stroke follow-up, intracranial hemorrhage EXAM: CT ANGIOGRAPHY HEAD AND NECK TECHNIQUE: Multidetector CT imaging of the head and neck was performed using the standard protocol during bolus administration of intravenous contrast. Multiplanar CT image reconstructions and MIPs were obtained to evaluate the vascular anatomy. Carotid stenosis measurements (when applicable) are obtained utilizing NASCET criteria, using the distal internal carotid diameter as the denominator. CONTRAST:  37m OMNIPAQUE IOHEXOL 350 MG/ML SOLN COMPARISON:  Correlation made with same day CT head FINDINGS: CTA NECK FINDINGS Aortic arch: Great vessel origins are patent with mild calcified plaque. Right carotid system: Patent. Calcified  plaque at the bifurcation and along the proximal ICA causes minimal stenosis. Left carotid system: Patent. Calcified plaque at the bifurcation and along the proximal ICA causes minimal stenosis. Vertebral arteries: Patent. Left vertebral artery is dominant. Mild calcified plaque at the right vertebral origin. Skeleton: Multilevel degenerative changes of the cervical spine. Other neck: Multinodular thyroid.  No neck mass or adenopathy. Upper chest: Scarring at the lung apices. Review of the MIP images confirms the above findings CTA HEAD FINDINGS Anterior circulation: Intracranial internal carotid arteries are patent. There is calcified plaque along the cavernous and paraclinoid portions without significant stenosis. Middle and anterior cerebral arteries are patent. The right A1 ACA is dominant. Posterior circulation: Intracranial vertebral arteries, basilar artery, and posterior cerebral arteries are patent. A left posterior communicating artery is present. Venous sinuses: As permitted by contrast timing, patent. Hypoplastic left transverse sinus. There is no abnormal vascularity or enhancement in the region of hemorrhage. Review of the MIP images confirms the above findings IMPRESSION: No abnormal vascularity or enhancement in the region of hemorrhage. No hemodynamically significant stenosis in the neck Electronically Signed   By: PMacy MisM.D.   On: 015-Mar-202110:33   CT Code Stroke CTA Neck W/WO contrast  Result Date: 22021-03-15CLINICAL DATA:  Code stroke follow-up, intracranial hemorrhage EXAM: CT ANGIOGRAPHY HEAD AND NECK TECHNIQUE: Multidetector CT imaging of the head and neck was performed using the standard protocol during bolus administration of intravenous contrast. Multiplanar CT image reconstructions and MIPs were obtained to evaluate the vascular anatomy. Carotid stenosis measurements (when applicable) are obtained utilizing NASCET criteria, using the distal internal carotid diameter as the  denominator. CONTRAST:  774mOMNIPAQUE IOHEXOL 350 MG/ML SOLN COMPARISON:  Correlation made with same day CT head FINDINGS: CTA NECK FINDINGS Aortic arch: Great vessel origins are patent with mild calcified plaque. Right carotid system: Patent. Calcified plaque at the bifurcation and along the proximal ICA causes minimal stenosis. Left carotid system: Patent. Calcified plaque at the bifurcation and along the proximal ICA causes minimal stenosis. Vertebral arteries: Patent. Left vertebral artery is dominant. Mild calcified plaque at the right vertebral origin. Skeleton: Multilevel degenerative changes of the cervical spine. Other neck: Multinodular thyroid.  No neck mass or adenopathy. Upper chest: Scarring at the lung apices. Review of the MIP images confirms the above findings CTA HEAD FINDINGS Anterior circulation: Intracranial internal carotid arteries are patent. There is calcified plaque along the cavernous and paraclinoid portions without significant stenosis. Middle and anterior cerebral arteries are patent. The right A1 ACA is dominant. Posterior circulation: Intracranial vertebral arteries, basilar artery, and posterior cerebral arteries are patent. A left posterior communicating artery is present. Venous sinuses: As permitted by contrast timing, patent. Hypoplastic left transverse sinus. There is no abnormal vascularity or enhancement in the region of hemorrhage. Review of the MIP images confirms the above findings IMPRESSION: No abnormal vascularity or enhancement in the region of hemorrhage. No hemodynamically significant stenosis in the neck Electronically Signed   By: Macy Mis M.D.   On: 09-09-19 10:33   CT HEAD CODE STROKE WO CONTRAST  Result Date: 09-09-2019 CLINICAL DATA:  Code stroke.  Focal neuro deficit. EXAM: CT HEAD WITHOUT CONTRAST TECHNIQUE: Contiguous axial images were obtained from the base of the skull through the vertex without intravenous contrast. COMPARISON:  CT head  06/16/2008 FINDINGS: Brain: Large hemorrhage in the left parietal lobe measuring 8.5 x 4.7 by 4.4 cm. This has high density with mild surrounding edema. Blood volume is approximately 85 mL of blood. There is mass-effect and 10 mm midline shift to the right small amount of hemorrhage in the left thalamus. Negative for hydrocephalus. No acute infarct or neoplasm identified. Vascular: Negative for hyperdense vessel Skull: Negative Sinuses/Orbits: Mucosal edema paranasal sinuses. Bilateral cataract surgery. Other: None ASPECTS ([place] Stroke Program Early CT Score) Not applicable due to acute hemorrhage. IMPRESSION: 1. Large parenchymal hematoma left parietal lobe. Blood volume 85 mL. 10 mm midline shift to the right. 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 Other considerations include hypertension and vascular malformation. 2. These results were called by telephone at the time of interpretation on September 09, 2019 at 10:11 am to provider Rory Percy , who verbally acknowledged these results. Electronically Signed   By: Franchot Gallo M.D.   On: 2019-09-09 10:12    Procedures Procedures (including critical care time)  Medications Ordered in ED Medications   stroke: mapping our early stages of recovery book (has no administration in time range)  acetaminophen (TYLENOL) tablet 650 mg (has no administration in time range)    Or  acetaminophen (TYLENOL) 160 MG/5ML solution 650 mg (has no administration in time range)    Or  acetaminophen (TYLENOL) suppository 650 mg (has no administration in time range)  senna-docusate (Senokot-S) tablet 1 tablet (has no administration in time range)  pantoprazole (PROTONIX) injection 40 mg (has no administration in time range)  labetalol (NORMODYNE) injection 20 mg (20 mg Intravenous Given Sep 09, 2019 1025)    And  clevidipine (CLEVIPREX) infusion 0.5 mg/mL (0 mg/hr Intravenous Stopped 09/09/19 1047)  sodium chloride (hypertonic) 3 % solution (has no administration in time range)    ondansetron (ZOFRAN) injection 4 mg (4 mg Intravenous Not Given 09/09/2019 1034)  ondansetron (  ZOFRAN) injection 4 mg (has no administration in time range)  iohexol (OMNIPAQUE) 350 MG/ML injection 75 mL (75 mLs Intravenous Contrast Given September 08, 2019 1014)  ondansetron (ZOFRAN) 4 MG/2ML injection (4 mg  Given 08-Sep-2019 1020)    ED Course  I have reviewed the triage vital signs and the nursing notes.  Pertinent labs & imaging results that were available during my care of the patient were reviewed by me and considered in my medical decision making (see chart for details).    MDM Rules/Calculators/A&P                       Pt met at the bridge upon pt's arrival by myself and the code stroke team.    Unfortunately, pt has a large [place].  Dr. Rory Percy spoke with pt's son to let him know what was going on.  He spoke with NS who will look at the scan and determine if there is anything they can do.  Pt is hypertensive, so she was started on cleviprex.    CRITICAL CARE Performed by: Isla Pence   Total critical care time: 30 minutes  Critical care time was exclusive of separately billable procedures and treating other patients.  Critical care was necessary to treat or prevent imminent or life-threatening deterioration.  Critical care was time spent personally by me on the following activities: development of treatment plan with patient and/or surrogate as well as nursing, discussions with consultants, evaluation of patient's response to treatment, examination of patient, obtaining history from patient or surrogate, ordering and performing treatments and interventions, ordering and review of laboratory studies, ordering and review of radiographic studies, pulse oximetry and re-evaluation of patient's condition.    Final Clinical Impression(s) / ED Diagnoses Final diagnoses:  Nontraumatic cortical hemorrhage of left cerebral hemisphere [facility])    Rx / DC Orders ED Discharge Orders    None        Isla Pence, MD 2019/09/08 1055

## 2019-08-27 NOTE — Code Documentation (Signed)
MD Ostergard at the bedside. Family brought back to speak with him. Decision made to go to surgery at 1133. Pt to go to the OR and then be admitted to [facility].

## 2019-08-27 DEATH — deceased

## 2019-08-29 ENCOUNTER — Telehealth: Payer: Self-pay

## 2019-08-29 NOTE — Telephone Encounter (Signed)
Per Dr Marin Olp, flowers ordered for pt's spouse due to her recent passing. dph

## 2019-09-24 ENCOUNTER — Other Ambulatory Visit: Payer: Medicare Other

## 2019-10-24 ENCOUNTER — Ambulatory Visit: Payer: Medicare HMO | Admitting: Nurse Practitioner

## 2020-02-13 ENCOUNTER — Ambulatory Visit: Payer: Medicare Other

## 2020-03-26 ENCOUNTER — Encounter: Payer: Medicare HMO | Admitting: Nurse Practitioner

## 2020-04-23 ENCOUNTER — Other Ambulatory Visit: Payer: Medicare HMO

## 2020-04-23 ENCOUNTER — Ambulatory Visit: Payer: Medicare HMO | Admitting: Hematology & Oncology
